# Patient Record
Sex: Female | Born: 1980 | Race: Black or African American | Hispanic: No | Marital: Single | Smoking: Former smoker
Health system: Southern US, Community
[De-identification: ages and names within clinical notes are randomized; demographics above are authoritative.]

## PROBLEM LIST (undated history)

## (undated) DIAGNOSIS — F419 Anxiety disorder, unspecified: Secondary | ICD-10-CM

## (undated) DIAGNOSIS — K861 Other chronic pancreatitis: Secondary | ICD-10-CM

## (undated) DIAGNOSIS — F1020 Alcohol dependence, uncomplicated: Secondary | ICD-10-CM

## (undated) HISTORY — PX: TUBAL LIGATION: SHX77

---
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
## 2002-01-25 ENCOUNTER — Emergency Department (HOSPITAL_COMMUNITY): Admission: EM | Admit: 2002-01-25 | Discharge: 2002-01-25 | Payer: Self-pay | Admitting: Emergency Medicine

## 2003-06-26 ENCOUNTER — Ambulatory Visit (HOSPITAL_COMMUNITY): Admission: AD | Admit: 2003-06-26 | Discharge: 2003-06-26 | Payer: Self-pay | Admitting: Obstetrics

## 2003-06-26 ENCOUNTER — Encounter (INDEPENDENT_AMBULATORY_CARE_PROVIDER_SITE_OTHER): Payer: Self-pay

## 2004-02-23 ENCOUNTER — Emergency Department (HOSPITAL_COMMUNITY): Admission: EM | Admit: 2004-02-23 | Discharge: 2004-02-23 | Payer: Self-pay

## 2004-02-24 ENCOUNTER — Emergency Department (HOSPITAL_COMMUNITY): Admission: EM | Admit: 2004-02-24 | Discharge: 2004-02-24 | Payer: Self-pay | Admitting: Emergency Medicine

## 2004-02-26 ENCOUNTER — Emergency Department (HOSPITAL_COMMUNITY): Admission: EM | Admit: 2004-02-26 | Discharge: 2004-02-26 | Payer: Self-pay | Admitting: Emergency Medicine

## 2004-12-23 ENCOUNTER — Emergency Department (HOSPITAL_COMMUNITY): Admission: EM | Admit: 2004-12-23 | Discharge: 2004-12-23 | Payer: Self-pay | Admitting: Emergency Medicine

## 2005-11-03 ENCOUNTER — Inpatient Hospital Stay (HOSPITAL_COMMUNITY): Admission: AD | Admit: 2005-11-03 | Discharge: 2005-11-03 | Payer: Self-pay | Admitting: Obstetrics

## 2006-05-15 ENCOUNTER — Inpatient Hospital Stay (HOSPITAL_COMMUNITY): Admission: AD | Admit: 2006-05-15 | Discharge: 2006-05-15 | Payer: Self-pay | Admitting: Obstetrics

## 2006-05-27 ENCOUNTER — Ambulatory Visit (HOSPITAL_COMMUNITY): Admission: RE | Admit: 2006-05-27 | Discharge: 2006-05-27 | Payer: Self-pay | Admitting: Obstetrics

## 2006-06-03 ENCOUNTER — Ambulatory Visit (HOSPITAL_COMMUNITY): Admission: RE | Admit: 2006-06-03 | Discharge: 2006-06-03 | Payer: Self-pay | Admitting: Obstetrics

## 2006-06-04 ENCOUNTER — Inpatient Hospital Stay (HOSPITAL_COMMUNITY): Admission: RE | Admit: 2006-06-04 | Discharge: 2006-06-06 | Payer: Self-pay | Admitting: Obstetrics

## 2006-07-21 ENCOUNTER — Observation Stay (HOSPITAL_COMMUNITY): Admission: AD | Admit: 2006-07-21 | Discharge: 2006-07-21 | Payer: Self-pay | Admitting: Obstetrics

## 2006-10-24 ENCOUNTER — Emergency Department (HOSPITAL_COMMUNITY): Admission: EM | Admit: 2006-10-24 | Discharge: 2006-10-24 | Payer: Self-pay | Admitting: Emergency Medicine

## 2006-11-20 ENCOUNTER — Inpatient Hospital Stay (HOSPITAL_COMMUNITY): Admission: AD | Admit: 2006-11-20 | Discharge: 2006-11-21 | Payer: Self-pay | Admitting: Obstetrics

## 2007-02-26 ENCOUNTER — Inpatient Hospital Stay (HOSPITAL_COMMUNITY): Admission: AD | Admit: 2007-02-26 | Discharge: 2007-02-26 | Payer: Self-pay | Admitting: Obstetrics

## 2007-04-13 ENCOUNTER — Inpatient Hospital Stay (HOSPITAL_COMMUNITY): Admission: AD | Admit: 2007-04-13 | Discharge: 2007-04-13 | Payer: Self-pay | Admitting: Obstetrics

## 2007-04-17 ENCOUNTER — Inpatient Hospital Stay (HOSPITAL_COMMUNITY): Admission: AD | Admit: 2007-04-17 | Discharge: 2007-04-17 | Payer: Self-pay | Admitting: Obstetrics & Gynecology

## 2007-05-17 ENCOUNTER — Inpatient Hospital Stay (HOSPITAL_COMMUNITY): Admission: AD | Admit: 2007-05-17 | Discharge: 2007-05-17 | Payer: Self-pay | Admitting: Obstetrics

## 2007-05-22 ENCOUNTER — Inpatient Hospital Stay (HOSPITAL_COMMUNITY): Admission: AD | Admit: 2007-05-22 | Discharge: 2007-05-24 | Payer: Self-pay | Admitting: Obstetrics

## 2007-05-23 ENCOUNTER — Encounter (INDEPENDENT_AMBULATORY_CARE_PROVIDER_SITE_OTHER): Payer: Self-pay | Admitting: Obstetrics

## 2010-12-05 NOTE — Op Note (Signed)
Kristina Moyer, Kristina NO.:  Moyer   MEDICAL RECORD NO.:  [id number]          PATIENT TYPE:  INP   LOCATION:  9107                          FACILITY:  [facility]   PHYSICIAN:  Kathreen Cosier, M.D.DATE OF BIRTH:  04-Mar-1981   DATE OF PROCEDURE:  05/23/2007  DATE OF DISCHARGE:                               OPERATIVE REPORT   PREOPERATIVE DIAGNOSIS:  Multiparity.   POSTOPERATIVE DIAGNOSIS:  Multiparity.   PROCEDURE:  Postpartum tubal ligation.   Under general anesthesia, the patient in the supine position, abdomen  prepped and draped, bladder emptied with a straight catheter.  A midline  subumbilical incision 1-inch long was made and carried to the fascia.  Fascia was cleaned, grasped with 2 Kochers on the fascia, and the fascia  and the peritoneum opened with the Mayo scissors.  The left tube was  grasped in the midportion with a Babcock clamp and the tube traced to  the fimbria.  Zero plain suture placed in the mesosalpinx below the  portion of tube within the clamp.  This was then tied and approximately  1 inch of tube transected.  Procedure done in a similar fashion on the  other side.  Lap and sponge counts correct.  Hemostasis satisfactory.  Abdomen closed in layers.  Peritoneum and fascia continuous suture of 0  Dexon.  Skin closed with subcuticular stitch of 4-0 Monocryl.  Blood  loss minimal.  The patient tolerated the procedure well, taken to  recovery room in good condition.           ______________________________  Kathreen Cosier, M.D.     BAM/MEDQ  D:  05/23/2007  T:  05/23/2007  Job:  [id number]

## 2010-12-08 NOTE — Op Note (Signed)
Kristina Moyer, Kristina Moyer NO.:  [id number]   MEDICAL RECORD NO.:  [id number]          PATIENT TYPE:  INP   LOCATION:  9122                          FACILITY:  [facility]   PHYSICIAN:  Kathreen Cosier, M.D.DATE OF BIRTH:  [birth year]   DATE OF PROCEDURE:  06/04/2006  DATE OF DISCHARGE:                                 OPERATIVE REPORT   HISTORY:  The patient is a 30 year old gravida 3, para 1, 0, 1, 1 with an  EDC of June 15, 2006.  She is known to have polyhydramnios and was  followed with nonstress test and ultrasound.  Amniocentesis done on June 03, 2006 showed the baby was mature.  She was brought in for induction.  Cervix is 3 cm.   The patient became fully dilated and push-to-plus three station, and  requested help.  A vacuum was applied at plus three station and through one  contraction delivery was affected.  No popoff.  The position was occiput  posterior.  There was a nuchal cord, which was cut prior to delivery.  The  placenta was spontaneous.  There was no episiotomy or laceration.           ______________________________  Kathreen Cosier, M.D.     BAM/MEDQ  D:  06/04/2006  T:  06/05/2006  Job:  [id number]

## 2010-12-08 NOTE — Op Note (Signed)
NAME:  Kristina Moyer, Kristina Moyer                          ACCOUNT NO.:  [id number]   MEDICAL RECORD NO.:  [id number]                   PATIENT TYPE:  MAT   LOCATION:  MATC                                 FACILITY:  [facility]   PHYSICIAN:  Kathreen Cosier, M.D.           DATE OF BIRTH:  1981-05-07   DATE OF PROCEDURE:  06/26/2003  DATE OF DISCHARGE:                                 OPERATIVE REPORT   PREOPERATIVE DIAGNOSIS:  Intrauterine fetal demise at nine weeks.   PROCEDURE:  Dilatation and evacuation.   Using MAC anesthesia and in the lithotomy position, the perineum and vagina  were prepped and draped, the bladder emptied with a straight catheter.  Bimanual exam revealed the uterus to be eight to 10 weeks' size.  A weighted  speculum placed in the vagina.  The cervix was injected with 10 mL of 1%  Xylocaine at 3, 9, and 12 o'clock.  The endometrial cavity sounded to 9 cm.  The cervical os was open and easily admitted a #9 suction tip.  The uterine  contents were aspirated until the cavity was clean.  The patient tolerated  the procedure well, was taken to the recovery room in good condition.                                               Kathreen Cosier, M.D.    BAM/MEDQ  D:  06/26/2003  T:  06/26/2003  Job:  [id number]

## 2011-05-02 LAB — CBC
HCT: 22.7 — ABNORMAL LOW
HCT: 25.4 — ABNORMAL LOW
Hemoglobin: 7.7 — CL
MCHC: 33.7
MCV: 86.7
RBC: 2.59 — ABNORMAL LOW
RBC: 2.93 — ABNORMAL LOW
WBC: 9.9

## 2011-05-02 LAB — RPR: RPR Ser Ql: NONREACTIVE

## 2011-05-03 LAB — CBC
HCT: 23.7 — ABNORMAL LOW
MCHC: 33.8
MCV: 86.8
Platelets: 274
RBC: 2.73 — ABNORMAL LOW

## 2011-05-03 LAB — DIFFERENTIAL
Eosinophils Absolute: 0
Lymphs Abs: 1.6
Monocytes Relative: 8
Neutro Abs: 5.9
Neutrophils Relative %: 72

## 2011-05-03 LAB — TYPE AND SCREEN

## 2011-05-03 LAB — URINALYSIS, ROUTINE W REFLEX MICROSCOPIC
Bilirubin Urine: NEGATIVE
Hgb urine dipstick: NEGATIVE
Ketones, ur: NEGATIVE
Nitrite: NEGATIVE
Urobilinogen, UA: 0.2

## 2011-05-03 LAB — WET PREP, GENITAL
Trich, Wet Prep: NONE SEEN
Yeast Wet Prep HPF POC: NONE SEEN

## 2011-05-03 LAB — RPR: RPR Ser Ql: NONREACTIVE

## 2011-05-03 LAB — HEPATITIS B SURFACE ANTIGEN: Hepatitis B Surface Ag: NEGATIVE

## 2011-05-03 LAB — HIV ANTIBODY (ROUTINE TESTING W REFLEX): HIV: NONREACTIVE

## 2011-05-07 LAB — CBC
HCT: 25.2 — ABNORMAL LOW
MCHC: 33.4
MCV: 84.4
RBC: 2.99 — ABNORMAL LOW
WBC: 8.2

## 2011-05-07 LAB — HIV ANTIBODY (ROUTINE TESTING W REFLEX): HIV: NONREACTIVE

## 2011-05-07 LAB — URINALYSIS, ROUTINE W REFLEX MICROSCOPIC
Bilirubin Urine: NEGATIVE
Glucose, UA: NEGATIVE
Hgb urine dipstick: NEGATIVE
Specific Gravity, Urine: 1.015
Urobilinogen, UA: 0.2

## 2011-05-07 LAB — DIFFERENTIAL
Basophils Relative: 0
Eosinophils Absolute: 0
Eosinophils Relative: 1
Lymphs Abs: 1.8
Monocytes Absolute: 0.6
Monocytes Relative: 7
Neutrophils Relative %: 70

## 2011-05-07 LAB — ABO/RH: ABO/RH(D): O POS

## 2011-05-07 LAB — RPR: RPR Ser Ql: NONREACTIVE

## 2011-05-07 LAB — TYPE AND SCREEN: ABO/RH(D): O POS

## 2012-07-18 ENCOUNTER — Emergency Department (HOSPITAL_COMMUNITY)
Admission: EM | Admit: 2012-07-18 | Discharge: 2012-07-18 | Disposition: A | Discharge status: Home / Self Care | Payer: Self-pay | Attending: Emergency Medicine | Admitting: Emergency Medicine

## 2012-07-18 ENCOUNTER — Encounter (HOSPITAL_COMMUNITY): Payer: Self-pay

## 2012-07-18 DIAGNOSIS — B86 Scabies: Secondary | ICD-10-CM | POA: Insufficient documentation

## 2012-07-18 MED ORDER — PERMETHRIN 5 % EX CREA: TOPICAL_CREAM | CUTANEOUS | Status: DC

## 2012-07-18 NOTE — ED Notes (Signed)
Patient presented to the Er with rah to hands, feet, arms, legs and back x more than a week.

## 2012-07-18 NOTE — ED Provider Notes (Signed)
History     CSN: [id number]  Arrival date & time 07/18/12  1027   First MD Initiated Contact with Patient 07/18/12 1047      Chief Complaint  Patient presents with  . Rash    (Consider location/radiation/quality/duration/timing/severity/associated sxs/prior treatment) HPI Comments: 75 y who presents for rash.  The rash started about 1 week ago. The rash started on the hands and wrist. The rash has spread to legs and abd.  The rash itches, no fevers.  The rash is not painful.  The rash is tiny discrete papules.  Multiple other family members with rash as well.    Patient is a 31 y.o. female presenting with rash. The history is provided by the patient. No language interpreter was used.  Rash  This is a new problem. The current episode started more than 1 week ago. The problem has not changed since onset.The problem is associated with an unknown factor. There has been no fever. The rash is present on the right hand, left hand, left arm, right arm and abdomen. The patient is experiencing no pain. Associated symptoms include itching. She has tried anti-itch cream for the symptoms. The treatment provided no relief.    History reviewed. No pertinent past medical history.  History reviewed. No pertinent past surgical history.  No family history on file.  History  Substance Use Topics  . Smoking status: Never Smoker   . Smokeless tobacco: Not on file  . Alcohol Use: No    OB History    Grav Para Term Preterm Abortions TAB SAB Ect Mult Living                  Review of Systems  Skin: Positive for itching and rash.  All other systems reviewed and are negative.    Allergies  Review of patient's allergies indicates no known allergies.  Home Medications   Current Outpatient Rx  Name  Route  Sig  Dispense  Refill  . PERMETHRIN 5 % EX CREA      Apply to affected area once repeat in one week   60 g   0     BP 104/87  Pulse 87  Temp 98.4 F (36.9 C) (Oral)  Wt 125 lb  11.2 oz (57.017 kg)  SpO2 100%  LMP 07/03/2012  Physical Exam  Nursing note and vitals reviewed. Constitutional: She is oriented to person, place, and time. She appears well-developed and well-nourished.  HENT:  Head: Normocephalic and atraumatic.  Right Ear: External ear normal.  Left Ear: External ear normal.  Mouth/Throat: Oropharynx is clear and moist.  Eyes: Conjunctivae normal and EOM are normal.  Neck: Normal range of motion. Neck supple.  Cardiovascular: Normal rate, normal heart sounds and intact distal pulses.   Pulmonary/Chest: Effort normal and breath sounds normal.  Abdominal: Soft. Bowel sounds are normal. There is no tenderness. There is no rebound.  Musculoskeletal: Normal range of motion.  Neurological: She is alert and oriented to person, place, and time.  Skin: Skin is warm.       Multiple scatter papules on hands and wrist and up arms, and on abdomen.  Consistent with scabies.    ED Course  Procedures (including critical care time)  Labs Reviewed - No data to display No results found.   1. Scabies       MDM  62 y with itchy rash x 1 week. Exam consistent with scabies.  Will start on meds.  Discussed signs that warrant reevaluation.  Chrystine Oiler, MD 07/18/12 [phone]

## 2012-09-17 ENCOUNTER — Inpatient Hospital Stay (HOSPITAL_COMMUNITY)
Admission: AD | Admit: 2012-09-17 | Discharge: 2012-09-17 | Disposition: A | Discharge status: Home / Self Care | Payer: Self-pay | Source: Ambulatory Visit | Attending: Obstetrics & Gynecology | Admitting: Obstetrics & Gynecology

## 2012-09-17 ENCOUNTER — Encounter (HOSPITAL_COMMUNITY): Payer: Self-pay

## 2012-09-17 DIAGNOSIS — R197 Diarrhea, unspecified: Secondary | ICD-10-CM | POA: Insufficient documentation

## 2012-09-17 DIAGNOSIS — R109 Unspecified abdominal pain: Secondary | ICD-10-CM | POA: Insufficient documentation

## 2012-09-17 DIAGNOSIS — K5289 Other specified noninfective gastroenteritis and colitis: Secondary | ICD-10-CM

## 2012-09-17 DIAGNOSIS — D509 Iron deficiency anemia, unspecified: Secondary | ICD-10-CM | POA: Insufficient documentation

## 2012-09-17 DIAGNOSIS — A088 Other specified intestinal infections: Secondary | ICD-10-CM | POA: Insufficient documentation

## 2012-09-17 DIAGNOSIS — K529 Noninfective gastroenteritis and colitis, unspecified: Secondary | ICD-10-CM

## 2012-09-17 DIAGNOSIS — R112 Nausea with vomiting, unspecified: Secondary | ICD-10-CM | POA: Insufficient documentation

## 2012-09-17 LAB — CBC
HCT: 26.2 % — ABNORMAL LOW (ref 36.0–46.0)
Hemoglobin: 8 g/dL — ABNORMAL LOW (ref 12.0–15.0)
MCHC: 30.5 g/dL (ref 30.0–36.0)
WBC: 8.1 10*3/uL (ref 4.0–10.5)

## 2012-09-17 LAB — URINE MICROSCOPIC-ADD ON

## 2012-09-17 LAB — BASIC METABOLIC PANEL
BUN: 7 mg/dL (ref 6–23)
Chloride: 101 mEq/L (ref 96–112)
GFR calc Af Amer: >90 mL/min (ref >90)
GFR calc non Af Amer: >90 mL/min (ref >90)
Glucose, Bld: 155 mg/dL — ABNORMAL HIGH (ref 70–99)
Potassium: 3.6 mEq/L (ref 3.5–5.1)
Sodium: 135 mEq/L (ref 135–145)

## 2012-09-17 LAB — URINALYSIS, ROUTINE W REFLEX MICROSCOPIC
Bilirubin Urine: NEGATIVE
Glucose, UA: NEGATIVE mg/dL
Ketones, ur: 15 mg/dL — AB
Protein, ur: NEGATIVE mg/dL
Urobilinogen, UA: 0.2 mg/dL (ref 0.0–1.0)

## 2012-09-17 LAB — POCT PREGNANCY, URINE: Preg Test, Ur: NEGATIVE

## 2012-09-17 MED ORDER — PROMETHAZINE HCL 25 MG PO TABS: 25.0000 mg | ORAL_TABLET | Freq: Four times a day (QID) | ORAL | Status: DC | PRN

## 2012-09-17 MED ORDER — FERROUS SULFATE 325 (65 FE) MG PO TABS: 325.0000 mg | ORAL_TABLET | Freq: Two times a day (BID) | ORAL | Status: DC

## 2012-09-17 MED ORDER — KETOROLAC TROMETHAMINE 60 MG/2ML IM SOLN
60.0000 mg | Freq: Once | INTRAMUSCULAR | Status: AC
  Administered 2012-09-17: 60 mg via INTRAMUSCULAR
  Filled 2012-09-17: qty 2

## 2012-09-17 MED ORDER — LACTATED RINGERS IV BOLUS (SEPSIS)
1000.0000 mL | Freq: Once | INTRAVENOUS | Status: AC
  Administered 2012-09-17: 1000 mL via INTRAVENOUS

## 2012-09-17 MED ORDER — PROMETHAZINE HCL 25 MG/ML IJ SOLN
25.0000 mg | Freq: Once | INTRAVENOUS | Status: AC
  Administered 2012-09-17: 25 mg via INTRAVENOUS
  Filled 2012-09-17: qty 1

## 2012-09-17 NOTE — MAU Note (Signed)
Pt had one instance of diarrhea in MAU

## 2012-09-17 NOTE — MAU Note (Signed)
Patient states she started having abdominal pain yesterday with vomiting. Denies bleeding or discharge. Has had her tubes tied.

## 2012-09-17 NOTE — MAU Provider Note (Signed)
History     CSN: [id number]  Arrival date and time: 09/17/12 4098   First Provider Initiated Contact with Patient 09/17/12 0830      Chief Complaint  Patient presents with  . Abdominal Pain   HPI Ms. Kristina Moyer is a 32 y.o. [phone] who presents to MAU today with complaint of N/V/D and abdominal pain since yesterday. N/V started yesterday and the first episode of diarrhea occurred here in MAU. The patient is in significant pain and states that it is "terrible."   After pain medication and IV administration of LR with phenergan, attempted to collect HPI again.   Patient states that she started having some mild abdominal discomfort yesterday during the day. Last night she started having N/V. She had not had diarrhea until arrival in MAU. She denies any sick contacts, or abnormal food consumption. She has not had fever. Her discomfort is mainly epigastric, but recently she has had some lower abdominal cramping. LMP 09/11/12.   OB History   Grav Para Term Preterm Abortions TAB SAB Ect Mult Living   4 3 3  1  1   3       Past Medical History  Diagnosis Date  . Medical history non-contributory   . Anemia     Past Surgical History  Procedure Laterality Date  . Tubal ligation      History reviewed. No pertinent family history.  History  Substance Use Topics  . Smoking status: Never Smoker   . Smokeless tobacco: Never Used  . Alcohol Use: No    Allergies: No Known Allergies  No prescriptions prior to admission    Review of Systems  Constitutional: Negative for fever.  Gastrointestinal: Positive for nausea, vomiting, abdominal pain and diarrhea. Negative for constipation and blood in stool.  Genitourinary: Negative for dysuria, urgency and frequency.  Musculoskeletal: Negative for back pain.  Neurological: Negative for dizziness and headaches.   Physical Exam   Blood pressure 97/52, pulse 71, temperature 97.6 F (36.4 C), temperature source Oral, resp. rate 18,  height 5\' 8"  (1.727 m), weight 120 lb 6.4 oz (54.613 kg), last menstrual period 09/11/2012, SpO2 100.00%.  Physical Exam  Constitutional: She is oriented to person, place, and time. She appears well-developed and well-nourished. She appears ill. No distress.  HENT:  Head: Normocephalic and atraumatic.  Cardiovascular: Normal rate, regular rhythm and normal heart sounds.   Respiratory: Effort normal and breath sounds normal. No respiratory distress.  GI: Soft. Bowel sounds are normal. She exhibits no distension and no mass. There is tenderness (mild diffuse tenderness to palpation). There is no rebound and no guarding.  Neurological: She is alert and oriented to person, place, and time.  Skin: Skin is warm and dry. No erythema.  Psychiatric: She has a normal mood and affect.   Results for orders placed during the hospital encounter of 09/17/12 (from the past 24 hour(s))  URINALYSIS, ROUTINE W REFLEX MICROSCOPIC     Status: Abnormal   Collection Time    09/17/12  8:00 AM      Result Value Range   Color, Urine YELLOW  YELLOW   APPearance HAZY (*) CLEAR   Specific Gravity, Urine >1.030 (*) 1.005 - 1.030   pH 6.0  5.0 - 8.0   Glucose, UA NEGATIVE  NEGATIVE mg/dL   Hgb urine dipstick LARGE (*) NEGATIVE   Bilirubin Urine NEGATIVE  NEGATIVE   Ketones, ur 15 (*) NEGATIVE mg/dL   Protein, ur NEGATIVE  NEGATIVE mg/dL  Urobilinogen, UA 0.2  0.0 - 1.0 mg/dL   Nitrite NEGATIVE  NEGATIVE   Leukocytes, UA TRACE (*) NEGATIVE  URINE MICROSCOPIC-ADD ON     Status: Abnormal   Collection Time    09/17/12  8:00 AM      Result Value Range   Squamous Epithelial / LPF FEW (*) RARE   WBC, UA 3-6  <3 WBC/hpf   RBC / HPF 11-20  <3 RBC/hpf   Bacteria, UA RARE  RARE  POCT PREGNANCY, URINE     Status: None   Collection Time    09/17/12  8:10 AM      Result Value Range   Preg Test, Ur NEGATIVE  NEGATIVE  CBC     Status: Abnormal   Collection Time    09/17/12  8:34 AM      Result Value Range   WBC 8.1   4.0 - 10.5 K/uL   RBC 3.55 (*) 3.87 - 5.11 MIL/uL   Hemoglobin 8.0 (*) 12.0 - 15.0 g/dL   HCT 16.1 (*) 09.6 - 04.5 %   MCV 73.8 (*) 78.0 - 100.0 fL   MCH 22.5 (*) 26.0 - 34.0 pg   MCHC 30.5  30.0 - 36.0 g/dL   RDW 40.9 (*) 81.1 - 91.4 %   Platelets 316  150 - 400 K/uL  BASIC METABOLIC PANEL     Status: Abnormal   Collection Time    09/17/12  8:34 AM      Result Value Range   Sodium 135  135 - 145 mEq/L   Potassium 3.6  3.5 - 5.1 mEq/L   Chloride 101  96 - 112 mEq/L   CO2 20  19 - 32 mEq/L   Glucose, Bld 155 (*) 70 - 99 mg/dL   BUN 7  6 - 23 mg/dL   Creatinine, Ser 7.82  0.50 - 1.10 mg/dL   Calcium 9.1  8.4 - 95.6 mg/dL   GFR calc non Af Amer >90  >90 mL/min   GFR calc Af Amer >90  >90 mL/min    MAU Course  Procedures None  MDM IM toradol, 2 L LR IV given; first liter with 25 mg phenergan infusion Patient reports improvement in symptoms.  Afebrile, WBC - WNL Anemia is chronic, patient is not symptomatic.   Assessment and Plan  A: Iron deficiency anemia Acute viral gastroenteritis  P: Discharge home Rx for phenergan and iron sent to patient's pharmacy Encouraged follow-up with PCP Patient may return if symptoms worsen, but advised since she is not pregnant that it may be best to go to urgent care or [facility] for further evaluation if symptoms do not improve or worsen   Freddi Starr, PA-C  09/17/2012, 1:37 PM

## 2012-09-17 NOTE — MAU Note (Signed)
Pt states pain/vomiting began yesterday,pain is upper and lower abdomen. Vomiting in room. Also trying to induce vomiting to feel better. Denies diarrhea. Denies hx ovarian cysts.

## 2012-09-18 NOTE — MAU Provider Note (Signed)
Attestation of Attending Supervision of Advanced Practitioner (PA/CNM/NP): Evaluation and management procedures were performed by the Advanced Practitioner under my supervision and collaboration.  I have reviewed the Advanced Practitioner's note and chart, and I agree with the management and plan.  Nida Manfredi, MD, FACOG Attending Obstetrician & Gynecologist Faculty Practice, [facility]  

## 2012-11-17 ENCOUNTER — Emergency Department (HOSPITAL_COMMUNITY): Payer: Self-pay

## 2012-11-17 ENCOUNTER — Inpatient Hospital Stay (HOSPITAL_COMMUNITY)
Admission: EM | Admit: 2012-11-17 | Discharge: 2012-11-20 | DRG: 439 | Disposition: A | Discharge status: Home / Self Care | Payer: Self-pay | Attending: Internal Medicine | Admitting: Internal Medicine

## 2012-11-17 ENCOUNTER — Encounter (HOSPITAL_COMMUNITY): Payer: Self-pay | Admitting: Emergency Medicine

## 2012-11-17 DIAGNOSIS — E871 Hypo-osmolality and hyponatremia: Secondary | ICD-10-CM

## 2012-11-17 DIAGNOSIS — E872 Acidosis, unspecified: Secondary | ICD-10-CM | POA: Diagnosis present

## 2012-11-17 DIAGNOSIS — F1010 Alcohol abuse, uncomplicated: Secondary | ICD-10-CM | POA: Diagnosis present

## 2012-11-17 DIAGNOSIS — K859 Acute pancreatitis without necrosis or infection, unspecified: Principal | ICD-10-CM | POA: Diagnosis present

## 2012-11-17 DIAGNOSIS — F17200 Nicotine dependence, unspecified, uncomplicated: Secondary | ICD-10-CM | POA: Diagnosis present

## 2012-11-17 DIAGNOSIS — D509 Iron deficiency anemia, unspecified: Secondary | ICD-10-CM | POA: Diagnosis present

## 2012-11-17 LAB — CBC WITH DIFFERENTIAL/PLATELET
Hemoglobin: 9.2 g/dL — ABNORMAL LOW (ref 12.0–15.0)
Lymphocytes Relative: 20 % (ref 12–46)
Lymphs Abs: 1.4 10*3/uL (ref 0.7–4.0)
Monocytes Relative: 6 % (ref 3–12)
Neutrophils Relative %: 74 % (ref 43–77)
Platelets: 461 10*3/uL — ABNORMAL HIGH (ref 150–400)
RBC: 3.95 MIL/uL (ref 3.87–5.11)
WBC: 6.9 10*3/uL (ref 4.0–10.5)

## 2012-11-17 LAB — COMPREHENSIVE METABOLIC PANEL
ALT: 8 U/L (ref 0–35)
AST: 29 U/L (ref 0–37)
Alkaline Phosphatase: 60 U/L (ref 39–117)
CO2: 20 mEq/L (ref 19–32)
Chloride: 99 mEq/L (ref 96–112)
GFR calc non Af Amer: >90 mL/min (ref >90)
Glucose, Bld: 106 mg/dL — ABNORMAL HIGH (ref 70–99)
Potassium: 3.9 mEq/L (ref 3.5–5.1)
Sodium: 134 mEq/L — ABNORMAL LOW (ref 135–145)
Total Bilirubin: 0.4 mg/dL (ref 0.3–1.2)

## 2012-11-17 LAB — URINE MICROSCOPIC-ADD ON

## 2012-11-17 LAB — BASIC METABOLIC PANEL
BUN: 5 mg/dL — ABNORMAL LOW (ref 6–23)
CO2: 21 mEq/L (ref 19–32)
Chloride: 105 mEq/L (ref 96–112)
GFR calc non Af Amer: >90 mL/min (ref >90)
Glucose, Bld: 76 mg/dL (ref 70–99)
Potassium: 4.3 mEq/L (ref 3.5–5.1)
Sodium: 136 mEq/L (ref 135–145)

## 2012-11-17 LAB — URINALYSIS, ROUTINE W REFLEX MICROSCOPIC
Bilirubin Urine: NEGATIVE
Hgb urine dipstick: NEGATIVE
Nitrite: NEGATIVE
Specific Gravity, Urine: 1.025 (ref 1.005–1.030)
Urobilinogen, UA: 1 mg/dL (ref 0.0–1.0)
pH: 7 (ref 5.0–8.0)

## 2012-11-17 LAB — IRON AND TIBC
Saturation Ratios: 7 % — ABNORMAL LOW (ref 20–55)
TIBC: 420 ug/dL (ref 250–470)
UIBC: 391 ug/dL (ref 125–400)

## 2012-11-17 LAB — LIPASE, BLOOD: Lipase: 194 U/L — ABNORMAL HIGH (ref 11–59)

## 2012-11-17 LAB — VITAMIN B12: Vitamin B-12: 498 pg/mL (ref 211–911)

## 2012-11-17 LAB — FOLATE: Folate: >20 ng/mL

## 2012-11-17 LAB — RETICULOCYTES: RBC.: 3.52 MIL/uL — ABNORMAL LOW (ref 3.87–5.11)

## 2012-11-17 LAB — POCT PREGNANCY, URINE: Preg Test, Ur: NEGATIVE

## 2012-11-17 MED ORDER — ONDANSETRON HCL 4 MG/2ML IJ SOLN
4.0000 mg | Freq: Once | INTRAMUSCULAR | Status: AC
  Administered 2012-11-17: 4 mg via INTRAVENOUS
  Filled 2012-11-17: qty 2

## 2012-11-17 MED ORDER — SODIUM CHLORIDE 0.9 % IV BOLUS (SEPSIS)
2000.0000 mL | Freq: Once | INTRAVENOUS | Status: AC
  Administered 2012-11-17: 2000 mL via INTRAVENOUS

## 2012-11-17 MED ORDER — LORAZEPAM 1 MG PO TABS
1.0000 mg | ORAL_TABLET | Freq: Four times a day (QID) | ORAL | Status: AC | PRN
  Administered 2012-11-17: 1 mg via ORAL
  Filled 2012-11-17: qty 1

## 2012-11-17 MED ORDER — FOLIC ACID 1 MG PO TABS
1.0000 mg | ORAL_TABLET | Freq: Every day | ORAL | Status: DC
  Administered 2012-11-17 – 2012-11-20 (×4): 1 mg via ORAL
  Filled 2012-11-17 (×5): qty 1

## 2012-11-17 MED ORDER — ONDANSETRON HCL 4 MG/2ML IJ SOLN
4.0000 mg | Freq: Four times a day (QID) | INTRAMUSCULAR | Status: DC | PRN
  Administered 2012-11-18 – 2012-11-19 (×2): 4 mg via INTRAVENOUS
  Filled 2012-11-17 (×3): qty 2

## 2012-11-17 MED ORDER — MORPHINE SULFATE 4 MG/ML IJ SOLN
4.0000 mg | Freq: Once | INTRAMUSCULAR | Status: AC
  Administered 2012-11-17: 4 mg via INTRAVENOUS
  Filled 2012-11-17: qty 1

## 2012-11-17 MED ORDER — MORPHINE SULFATE 2 MG/ML IJ SOLN
2.0000 mg | INTRAMUSCULAR | Status: DC | PRN
  Administered 2012-11-17 – 2012-11-20 (×10): 2 mg via INTRAVENOUS
  Filled 2012-11-17 (×11): qty 1

## 2012-11-17 MED ORDER — ENOXAPARIN SODIUM 40 MG/0.4ML ~~LOC~~ SOLN
40.0000 mg | SUBCUTANEOUS | Status: DC
  Administered 2012-11-17 – 2012-11-19 (×3): 40 mg via SUBCUTANEOUS
  Filled 2012-11-17 (×5): qty 0.4

## 2012-11-17 MED ORDER — THIAMINE HCL 100 MG/ML IJ SOLN
100.0000 mg | Freq: Every day | INTRAMUSCULAR | Status: DC
  Filled 2012-11-17 (×3): qty 1

## 2012-11-17 MED ORDER — VITAMIN B-1 100 MG PO TABS
100.0000 mg | ORAL_TABLET | Freq: Every day | ORAL | Status: DC
  Administered 2012-11-18 – 2012-11-20 (×3): 100 mg via ORAL
  Filled 2012-11-17 (×4): qty 1

## 2012-11-17 MED ORDER — LORAZEPAM 2 MG/ML IJ SOLN: 1.0000 mg | Freq: Four times a day (QID) | INTRAMUSCULAR | Status: AC | PRN

## 2012-11-17 MED ORDER — POTASSIUM CHLORIDE 2 MEQ/ML IV SOLN
INTRAVENOUS | Status: AC
  Administered 2012-11-17: 17:00:00 via INTRAVENOUS
  Filled 2012-11-17 (×4): qty 1000

## 2012-11-17 MED ORDER — POTASSIUM CHLORIDE 2 MEQ/ML IV SOLN
INTRAVENOUS | Status: DC
  Administered 2012-11-18 – 2012-11-20 (×6): via INTRAVENOUS
  Filled 2012-11-17 (×9): qty 1000

## 2012-11-17 NOTE — ED Notes (Signed)
REPORT CALLED TO AMANDA ON 6 NORTH

## 2012-11-17 NOTE — ED Notes (Signed)
Patient transported to Ultrasound 

## 2012-11-17 NOTE — ED Notes (Signed)
PATIENT STATES HAVING ABDOMINAL PAIN AND BACK PAIN FOR THE PAST COUPLE DAYS. STATES SHE HAS BEEN NAUSEATED. PT ADMITS TO DAILY ALCOHOL CONSUMPTION. STATES NO HISTORY OF PANCREATITIS. STATES SHE DRINKS USUALLY A COUPLE SHOTS OF LIQUOR AND 2-3 40 OUNCE BEERS A DAY. STATES "I CAN QUIT DRINKING ANYTIME WITH NO PROBLEM". DENIES HISTORY OF DT'S.

## 2012-11-17 NOTE — ED Provider Notes (Signed)
History     CSN: [id number]  Arrival date & time 11/17/12  0846   First MD Initiated Contact with Patient 11/17/12 [phone]      Chief Complaint  Patient presents with  . Abdominal Pain  . Emesis    (Consider location/radiation/quality/duration/timing/severity/associated sxs/prior treatment) HPI  Kristina Moyer is a 32 y.o. female complaining of diffuse abdominal pain worsening over the course of the last 2 days. Patient also has multiple episodes of nonbloody, possibly bilious emesis starting last night. Patient rates her pain at 10 out of 10. She denies constipation, diarrhea, fever, dysuria, frequency, abnormal vaginal discharge, cough, chest pain, shortness of breath.   Patient drinks alcohol daily, she drank excessively over the course of the last weekend. She has never had any withdrawal symptoms such as seizure or shakiness. She states that she has been drinking daily only over the course of the last year.  Last menstrual period was April 15  Past Medical History  Diagnosis Date  . Medical history non-contributory   . Anemia     Past Surgical History  Procedure Laterality Date  . Tubal ligation      History reviewed. No pertinent family history.  History  Substance Use Topics  . Smoking status: Never Smoker   . Smokeless tobacco: Never Used  . Alcohol Use: No    OB History   Grav Para Term Preterm Abortions TAB SAB Ect Mult Living   4 3 3  1  1   3       Review of Systems  Constitutional: Negative for fever.  Respiratory: Negative for shortness of breath.   Cardiovascular: Negative for chest pain.  Gastrointestinal: Positive for nausea, vomiting and abdominal pain. Negative for diarrhea.  All other systems reviewed and are negative.    Allergies  Review of patient's allergies indicates no known allergies.  Home Medications   Current Outpatient Rx  Name  Route  Sig  Dispense  Refill  . ibuprofen (ADVIL,MOTRIN) 200 MG tablet   Oral   Take 400 mg by  mouth every 6 (six) hours as needed for pain.           BP 136/86  Pulse 76  Temp(Src) 97.6 F (36.4 C) (Oral)  Resp 14  SpO2 100%  LMP 11/03/2012  Physical Exam  Nursing note and vitals reviewed. Constitutional: She is oriented to person, place, and time. She appears well-developed and well-nourished. No distress.  Writing, appears acutely uncomfortable.  HENT:  Head: Normocephalic.  Eyes: Conjunctivae and EOM are normal.  Cardiovascular: Normal rate, regular rhythm, normal heart sounds and intact distal pulses.   Pulmonary/Chest: Effort normal and breath sounds normal. No stridor. No respiratory distress. She has no wheezes. She has no rales. She exhibits no tenderness.  Abdominal: Soft. Bowel sounds are normal. She exhibits no distension and no mass. There is tenderness. There is no rebound and no guarding.  CC tender to palpation with no guarding or rebound, normal bowel sounds. There is no tenderness over McBurney's point, Rovsing, psoas and obturator signs are negative.  Musculoskeletal: Normal range of motion.  Neurological: She is alert and oriented to person, place, and time.  Psychiatric: She has a normal mood and affect.    ED Course  Procedures (including critical care time)  Labs Reviewed  URINALYSIS, ROUTINE W REFLEX MICROSCOPIC - Abnormal; Notable for the following:    APPearance CLOUDY (*)    Ketones, ur >80 (*)    Leukocytes, UA MODERATE (*)  All other components within normal limits  CBC WITH DIFFERENTIAL - Abnormal; Notable for the following:    Hemoglobin 9.2 (*)    HCT 28.0 (*)    MCV 70.9 (*)    MCH 23.3 (*)    RDW 19.2 (*)    Platelets 461 (*)    All other components within normal limits  LIPASE, BLOOD - Abnormal; Notable for the following:    Lipase 194 (*)    All other components within normal limits  COMPREHENSIVE METABOLIC PANEL - Abnormal; Notable for the following:    Sodium 134 (*)    Glucose, Bld 106 (*)    BUN 5 (*)    All other  components within normal limits  URINE MICROSCOPIC-ADD ON - Abnormal; Notable for the following:    Squamous Epithelial / LPF MANY (*)    Bacteria, UA FEW (*)    All other components within normal limits  URINE CULTURE  POCT PREGNANCY, URINE   US Abdomen Complete  11/17/2012  *RADIOLOGY REPORT*  Clinical Data:  Abdominal pain, nausea, vomiting.  COMPLETE ABDOMINAL ULTRASOUND  Comparison:  CT 02/24/2004.  Findings:  Gallbladder:  No gallstones, gallbladder wall thickening, or pericholecystic fluid.  Common bile duct:   Normal caliber, 2 mm.  Liver:  No focal lesion identified.  Within normal limits in parenchymal echogenicity.  IVC:  Appears normal.  Pancreas:  No focal abnormality seen.  Spleen:  Within normal limits in size and echotexture.  Right Kidney:   Normal in size and parenchymal echogenicity.  No evidence of mass or hydronephrosis.  Left Kidney:  Normal in size and parenchymal echogenicity.  No evidence of mass or hydronephrosis.  Abdominal aorta:  No aneurysm identified.  IMPRESSION: Negative abdominal ultrasound.   Original Report Authenticated By: Charlett Nose, M.D.      1. Acute pancreatitis       MDM   Kristina Moyer is a 32 y.o. female with 2 days of nausea vomiting and abdominal pain. Abdominal exam is nonsurgical with no guarding or rebound patient is diffusely tender to palpation especially in the epigastrium. Plan is IV fluids, make n.p.o., basic blood work and abdominal ultrasound  Blood work is significant for lipase of 194. No elevation in liver function tests or creatinine. Patient has no leukocytosis. She does not appear to be hemoconcentrated she has a chronic anemia.  Further discussion with the patient shows that she is in daily drinker. This is likely the cause of her pancreatitis. Abdominal ultrasound shows no abnormalities in the biliary tree.  She will be admitted to internal medicine teaching service under the care of Dr. Manson Passey.   Filed Vitals:   11/17/12  0900  BP: 136/86  Pulse: 76  Temp: 97.6 F (36.4 C)  TempSrc: Oral  Resp: 14  SpO2: 100%          Wynetta Emery, PA-C 11/17/12 1212

## 2012-11-17 NOTE — ED Notes (Signed)
Pt from home c/o abd pain and n/v x 2 days.

## 2012-11-17 NOTE — H&P (Signed)
Hospital Admission Note Date: 11/17/2012  Patient name: Kristina Moyer Medical record number: [id number] Date of birth: Dec 12, 1980 Age: 32 y.o. Gender: female PCP: No primary provider on file.   Service:  Internal Medicine Teaching Service  Attending Physician:  Dr. Kem Kays   First Contact:  Dr. Earlene Plater   Pager:  [phone] Second Contact:  Dr. Manson Passey  Pager: [phone]      After 5PM, weekends, and holidays: First Contact:              Pager: [phone] Second Contact:         Pager: [phone]    Chief Complaint:  Abdominal pain and nausea    History of Present Illness:  32 year old woman with no past medical history, presenting with 6 days of abdominal pain and one day of nausea and vomiting. Onset was 6 days ago. Patient admits to heavy alcohol abuse may be to blame. Quality described as cramping. Location is periumbilical and epigastric without radiation. Severity is now rated at 10/10. Since onset, pain has been gradually intensifying; but it has been constant since onset. Patient has tried ibuprofen without relief. No aggravating or alleviating factors. No other symptoms associated with this. Review of systems negative. Patient reports drinking 3 beers every day with binge drinking on weekends.     Review of Systems:   Constitutional: Negative for fever and chills.  HENT: Negative for hearing loss, ear pain, congestion and sore throat.   Eyes: Negative for blurred vision.  Respiratory: Negative for cough, sputum production and shortness of breath.   Cardiovascular: Negative for chest pain and leg swelling.  Gastrointestinal: Positive for nausea, vomiting and abdominal pain. Negative for diarrhea, constipation and blood in stool.  Genitourinary: Negative for dysuria and hematuria.  Musculoskeletal: Negative for myalgias.  Skin: Negative for rash.  Neurological: Negative for dizziness and headaches.     Medical History: History reviewed. No pertinent past medical history.  Surgical  History: Past Surgical History  Procedure Laterality Date  . Tubal ligation      Home Medications: Current Outpatient Rx  Name  Route  Sig  Dispense  Refill  . ibuprofen (ADVIL,MOTRIN) 200 MG tablet   Oral   Take 400 mg by mouth every 6 (six) hours as needed for pain.           Allergies: Allergies as of 11/17/2012  . (No Known Allergies)    Family History: History reviewed. No pertinent family history.  Social History: History   Social History  . Marital Status: Single    Spouse Name: N/A    Number of Children: 3  . Years of Education: N/A   Social History Main Topics  . Smoking status: Current Every Day Smoker -- 0.50 packs/day    Types: Cigarettes  . Smokeless tobacco: Not on file  . Alcohol Use: 10.5 oz/week    21 drink(s) per week     Comment: 3 beers per day, binge drinking on weekends  . Drug Use: No  . Sexually Active: Yes    Birth Control/ Protection: Surgical, Condom    Physical exam:  VITALS: BP 136/86, HR 76, RR 14, temp 97.24F, SpO2 100% on room air GENERAL: well developed, well nourished; no acute distress HEAD: atraumatic, normocephalic EYES: pupils equal, round and reactive; sclera anicteric; normal conjunctiva EARS: canals patent and TMs normal bilaterally NOSE/THROAT: oropharynx clear, moist mucous membranes, pink gums, normal dentition NECK: supple, thyroid normal in size and without palpable nodules LYMPH: no cervical or supraclavicular lymphadenopathy  LUNGS: clear to auscultation bilaterally, normal work of breathing HEART: normal rate and regular rhythm; normal S1 and S2 without S3 or S4; no murmurs, rubs, or clicks PULSES: radial and dorsalis pedis pulses are 2+ and symmetric ABDOMEN: soft, diffuse generalized tenderness with a patient of all quadrants, normal bowel sounds, no masses or organomegaly, no rebound tenderness SKIN: warm, dry, intact, normal turgor, no rashes EXTREMITIES: no peripheral edema, clubbing, or cyanosis       Lab results: CMP  Component Value Date/Time   NA 134* 11/17/2012 0918   K 3.9 11/17/2012 0918   CL 99 11/17/2012 0918   CO2 20 11/17/2012 0918   GLUCOSE 106* 11/17/2012 0918   BUN 5* 11/17/2012 0918   CREATININE 0.63 11/17/2012 0918   CALCIUM 9.8 11/17/2012 0918   PROT 7.8 11/17/2012 0918   ALBUMIN 4.2 11/17/2012 0918   AST 29 11/17/2012 0918   ALT 8 11/17/2012 0918   ALKPHOS 60 11/17/2012 0918   BILITOT 0.4 11/17/2012 0918   GFRNONAA >90 11/17/2012 0918   GFRAA >90 11/17/2012 0918    Lipase   11/17/12 0918  LIPASE 194*    CBC Component Value Date/Time   WBC 6.9 11/17/2012 0918   RBC 3.95 11/17/2012 0918   HGB 9.2* 11/17/2012 0918   HCT 28.0* 11/17/2012 0918   PLT 461* 11/17/2012 0918   MCV 70.9* 11/17/2012 0918   MCH 23.3* 11/17/2012 0918   MCHC 32.9 11/17/2012 0918   RDW 19.2* 11/17/2012 0918   LYMPHSABS 1.4 11/17/2012 0918   MONOABS 0.4 11/17/2012 0918   EOSABS 0.0 11/17/2012 0918   BASOSABS 0.0 11/17/2012 0918     Urinalysis:  11/17/12 1116  COLORURINE YELLOW  LABSPEC 1.025  PHURINE 7.0  GLUCOSEU NEGATIVE  HGBUR NEGATIVE  BILIRUBINUR NEGATIVE  KETONESUR >80*  PROTEINUR NEGATIVE  UROBILINOGEN 1.0  NITRITE NEGATIVE  LEUKOCYTESUR MODERATE*  WBC 7-10  RBC 0-2  SQUAM CELLS MANY*  BACTERIA FEW*    Misc. Labs: UPC NEGATIVE   Imaging results: US Abdomen Complete 11/17/2012  FINDINGS:  Gallbladder:  No gallstones, gallbladder wall thickening, or pericholecystic fluid.  Common bile duct:   Normal caliber, 2 mm.  Liver:  No focal lesion identified.  Within normal limits in parenchymal echogenicity.  IVC:  Appears normal.  Pancreas:  No focal abnormality seen.  Spleen:  Within normal limits in size and echotexture.  Right Kidney:   Normal in size and parenchymal echogenicity.  No evidence of mass or hydronephrosis.  Left Kidney:  Normal in size and parenchymal echogenicity.  No evidence of mass or hydronephrosis.  Abdominal aorta:  No aneurysm identified.  IMPRESSION: Negative  abdominal ultrasound.    Assessment and Plan:  1.   Acute pancreatitis:  Lipase elevated at 194. Likely provoked by alcohol binge along with chronic alcohol abuse. Ultrasound of abdomen negative for stones. Calcium normal. Bowel rest initiated. IV analgesia and maintenance fluids.   2.   Alcohol abuse:  Patient drinks 3 beers a day along with binge drinking on the weekends. Social work consult for this. I provided the initial education on the length between alcohol abuse and pancreatitis.  I advised her to stop drinking altogether. CIWA protocol.  3.   Anion gap metabolic acidosis, alcoholic ketoacidosis:  Anion gap 15. Bicarbonate 20. Greater than 80 ketones in urine. Glucose is 106. We will provide intravenous hydration.   4.   Microcytic anemia:  Hemoglobin 9.2 with an MCV of 70.9. The report of abnormal bleeding. Menstruation lasts 5  days with a normal flow. We will check iron studies.  5.   Prophylaxis:  Enoxaparin 40 mg [place] daily  6.   Disposition:  No PCP.  Expected discharge date > 2 days.    Signed by:  Dorthula Rue. Earlene Plater, MD PGY-I, Internal Medicine  11/17/2012, 12:40 PM

## 2012-11-17 NOTE — ED Notes (Signed)
Pt stated that she does not feel as if she needs to use the bathroom

## 2012-11-17 NOTE — ED Notes (Signed)
Patient is resting comfortably. 

## 2012-11-18 DIAGNOSIS — K859 Acute pancreatitis without necrosis or infection, unspecified: Principal | ICD-10-CM

## 2012-11-18 LAB — BASIC METABOLIC PANEL
Calcium: 8.5 mg/dL (ref 8.4–10.5)
Creatinine, Ser: 0.64 mg/dL (ref 0.50–1.10)
GFR calc non Af Amer: >90 mL/min (ref >90)
Glucose, Bld: 127 mg/dL — ABNORMAL HIGH (ref 70–99)
Sodium: 135 mEq/L (ref 135–145)

## 2012-11-18 LAB — URINE CULTURE

## 2012-11-18 LAB — CBC
MCH: 23 pg — ABNORMAL LOW (ref 26.0–34.0)
Platelets: 361 10*3/uL (ref 150–400)
RBC: 3.13 MIL/uL — ABNORMAL LOW (ref 3.87–5.11)
RDW: 19.2 % — ABNORMAL HIGH (ref 11.5–15.5)

## 2012-11-18 LAB — HIV ANTIBODY (ROUTINE TESTING W REFLEX): HIV: NONREACTIVE

## 2012-11-18 LAB — GC/CHLAMYDIA PROBE AMP: GC Probe RNA: NEGATIVE

## 2012-11-18 MED ORDER — FERROUS SULFATE 325 (65 FE) MG PO TABS
325.0000 mg | ORAL_TABLET | Freq: Three times a day (TID) | ORAL | Status: DC
  Administered 2012-11-18 – 2012-11-20 (×7): 325 mg via ORAL
  Filled 2012-11-18 (×9): qty 1

## 2012-11-18 MED ORDER — FERUMOXYTOL INJECTION 510 MG/17 ML: 510.0000 mg | Freq: Once | INTRAVENOUS | Status: DC

## 2012-11-18 NOTE — Progress Notes (Signed)
MEDICATION RELATED CONSULT NOTE - INITIAL   Pharmacy Consult for Ferumoxytol Indication: Iron deficiency anemia  No Known Allergies  Patient Measurements: Height: 5\' 9"  (175.3 cm) Weight: 110 lb (49.896 kg) IBW/kg (Calculated) : 66.2  Vital Signs: Temp: 98.2 F (36.8 C) (04/29 0553) Temp src: Oral (04/29 0553) BP: 108/64 mmHg (04/29 0553) Pulse Rate: 62 (04/29 0553) Intake/Output from previous day:   Intake/Output from this shift:    Labs:  Recent Labs  11/17/12 0918 11/17/12 1635 11/18/12 0620  WBC 6.9  --  3.9*  HGB 9.2*  --  7.4*  HCT 28.0*  --  22.9*  PLT 461*  --  361  CREATININE 0.63 0.58 0.64  ALBUMIN 4.2  --   --   PROT 7.8  --   --   AST 29  --   --   ALT 8  --   --   ALKPHOS 60  --   --   BILITOT 0.4  --   --    Estimated Creatinine Clearance: 81.7 ml/min (by C-G formula based on Cr of 0.64).   Microbiology: No results found for this or any previous visit (from the past 720 hour(s)).  Medical History: History reviewed. No pertinent past medical history.  Medications:  Prescriptions prior to admission  Medication Sig Dispense Refill  . ibuprofen (ADVIL,MOTRIN) 200 MG tablet Take 400 mg by mouth every 6 (six) hours as needed for pain.        Assessment: 32 y.o. female presents with abd pain and nausea - found to have acute pancreatitis likely provoked by ETOH binge. Noted pt with Hgb down to 7.4 and iron studies show iron deficient anemia. Pharmacy asked to dose iron replacement.  Goal of Therapy:  Repletion of iron stores  Plan:  1. Ferumoxytol 510mg  IV x 1 today. 2. Suspect pt will need po iron as maintenance therapy after IV iron given. 3. Consider repeat iron studies in ~30 days to assess response. 4. Pharmacy will sign off for now - please reconsult if further help needed.  Thanks Christoper Fabian, PharmD, BCPS Clinical pharmacist, pager [phone] 11/18/2012,9:54 AM

## 2012-11-18 NOTE — Progress Notes (Addendum)
INITIAL NUTRITION ASSESSMENT  DOCUMENTATION CODES Per approved criteria  -Underweight, malnutrition suspected   INTERVENTION: 1.  General healthful diet; encouraged intake of food and beverages.  Encouraged well-balanced diet and promoted wt gain for pt with underweight BMI.  NUTRITION DIAGNOSIS: Underweight related to EtOH use, poor appetite as evidenced by pt statement.   Monitor:  1.  Food/Beverage; diet advancement with tolerance. Pt meeting >/=90% estimated needs.  Reason for Assessment: BMI  32 y.o. female  Admitting Dx: Acute pancreatitis  ASSESSMENT: Pt admitted with abdominal pain.  Pt reported increased alcohol use on admission.  Found to have pancreatitis with elevated lipase. Lipase     Component Value Date/Time   LIPASE 194* 11/17/2012 1610    RD drawn to chart due to pt underweight AEB BMI of 16.3.    Nutrition Focused Physical Exam:  Subcutaneous Fat:  Orbital Region: wnl Upper Arm Region: wnl Thoracic and Lumbar Region: wnl  Muscle:  Temple Region: wnl Clavicle Bone Region: wnl, some protrusion consistent with gender norm Clavicle and Acromion Bone Region: wnl Scapular Bone Region: n/a Dorsal Hand: n/a Patellar Region: wnl Anterior Thigh Region: n/a Posterior Calf Region: wnl  Edema: none present  NFPE shows no evidence of wasting and pt reports usual wt as 115 lbs.  Pt does endorse poor eating habits and reports she has always been thin.  Pt states she eats daily, however does not eat set meals, generally snacking/grazing.  Per chart review, pt weighed 125 lbs in 06/2012. Reviewed with pt who re-states that 115 lbs is her usual wt. No previous wt hx. Unable to dx with malnutrition based on current data, however poor nutrition status suspected based on social hx and nutrition-related assessment.  RD will continue to assess for underlying malnutrition.  Will also follow for diet progression.  Height: Ht Readings from Last 1 Encounters:  11/17/12 5'  9" (1.753 m)    Weight: Wt Readings from Last 1 Encounters:  11/17/12 110 lb (49.896 kg)    Ideal Body Weight: 145 lbs  % Ideal Body Weight: 75%  Wt Readings from Last 10 Encounters:  11/17/12 110 lb (49.896 kg)  09/17/12 120 lb 6.4 oz (54.613 kg)  07/18/12 125 lb 11.2 oz (57.017 kg)    Usual Body Weight: 115 lbs per pt  % Usual Body Weight: 95%  BMI:  Body mass index is 16.24 kg/(m^2).  Estimated Nutritional Needs: Kcal: 1500-1750 Protein: 65-75g Fluid: >1.5 L/day  Skin: intact  Diet Order: Clear Liquid  EDUCATION NEEDS: -Education needs addressed  No intake or output data in the 24 hours ending 11/18/12 1436  Last BM: 4/27  Labs:   Recent Labs Lab 11/17/12 0918 11/17/12 1635 11/18/12 0620  NA 134* 136 135  K 3.9 4.3 3.8  CL 99 105 106  CO2 20 21 21   BUN 5* 5* 3*  CREATININE 0.63 0.58 0.64  CALCIUM 9.8 8.5 8.5  GLUCOSE 106* 76 127*    CBG (last 3)  No results found for this basename: GLUCAP,  in the last 72 hours  Scheduled Meds: . enoxaparin (LOVENOX) injection  40 mg Subcutaneous Q24H  . ferrous sulfate  325 mg Oral TID AC  . folic acid  1 mg Oral Daily  . thiamine  100 mg Oral Daily   Or  . thiamine  100 mg Intravenous Daily    Continuous Infusions: . dextrose 5 % and 0.45% NaCl 1,000 mL with potassium chloride 20 mEq infusion 90 mL/hr at 11/18/12 0219  History reviewed. No pertinent past medical history.  Past Surgical History  Procedure Laterality Date  . Tubal ligation      Loyce Dys, MS RD LDN Clinical Inpatient Dietitian Pager: [phone] Weekend/After hours pager: [phone]

## 2012-11-18 NOTE — Progress Notes (Signed)
Subjective:    Interval Events:  Patient feels better but still has epigastric abdominal pain.  Last BM was 4/27.  She is hungry and wants to eat.    Objective:    Vital Signs:   Filed Vitals:   11/17/12 1738 11/17/12 2120 11/18/12 0238 11/18/12 0553  BP: 121/79 113/68 122/79 108/64  Pulse: 61 58 64 62  Temp: 98.3 F (36.8 C) 98.1 F (36.7 C) 98.2 F (36.8 C) 98.2 F (36.8 C)  TempSrc: Oral Oral Oral Oral  Resp: 18 14 17 16   Height:      Weight:      SpO2: 100% 100% 98% 100%    Weights: [employer]   11/17/12 1439  Weight: 110 lb (49.896 kg)    Intake/Output:  Last BM Date: 11/16/12   Physical Exam: GENERAL: alert and oriented; resting comfortably in bed and in no distress LUNGS: clear to auscultation bilaterally, normal work of breathing HEART: normal rate; regular rhythm ABDOMEN: tender with palpation of the epigastrium, normal bowel sounds    Labs: Basic Metabolic Panel: Lab 11/17/12 0918 11/17/12 1635 11/18/12 0620  NA 134* 136 135  K 3.9 4.3 3.8  CL 99 105 106  CO2 20 21 21   GLUCOSE 106* 76 127*  BUN 5* 5* 3*  CREATININE 0.63 0.58 0.64  CALCIUM 9.8 8.5 8.5    CBC: Lab 11/17/12 0918 11/18/12 0620  WBC 6.9 3.9*  NEUTROABS 5.1  --   HGB 9.2* 7.4*  HCT 28.0* 22.9*  MCV 70.9* 73.2*  PLT 461* 361    Anemia Panel  Component Value Date/Time   IRON 29* 11/17/2012 1635   TIBC 420 11/17/2012 1635   FERRITIN 7* 11/17/2012 1635   % SAT 7 11/17/2012 1635   FOLATE >20 11/17/2012 1635   B12 498 11/17/2012 1635     STD Screening Component Value   HIV PENDING   GC PENDING   CHLAMYDIA PENDING       Medications:    Infusions: . dextrose 5 % and 0.45% NaCl 1,000 mL with potassium chloride 20 mEq infusion 90 mL/hr at 11/18/12 0219     Scheduled Medications: . enoxaparin (LOVENOX) injection  40 mg Subcutaneous Q24H  . ferrous sulfate  325 mg Oral TID AC  . folic acid  1 mg Oral Daily  . thiamine  100 mg Oral Daily   Or  . thiamine   100 mg Intravenous Daily     PRN Medications: LORazepam, LORazepam, morphine injection, ondansetron (ZOFRAN) IV    Assessment/ Plan:    1.   Acute pancreatitis:  Lipase elevated at 194. Likely provoked by alcohol binge along with chronic alcohol abuse. Ultrasound of abdomen negative for stones. Calcium normal.  IV analgesia and maintenance fluids. - Advance to clears - Continue maintenance fluids until tolerating diet  2.   Alcohol abuse:  Patient drinks 3 beers a day along with binge drinking on the weekends. Social work consult for this. I provided the initial education on the length between alcohol abuse and pancreatitis. I advised her to stop drinking altogether. CIWA protocol.  3.   Anion gap metabolic acidosis, alcoholic ketoacidosis - resolved:  Anion gap 15. Bicarbonate 20. Greater than 80 ketones in urine. Glucose is 106. Gap closed after IV hydration.  4.   Iron deficiency anemia:  Hemoglobin 9.2 with an MCV of 70.9. Hemoglobin fell to 7.4 overnight.  Anemia panel consistent with iron deficiency, most likely from menstruation and poor diet. - Hold off  on IV iron - Start oral ferrous sulfate 325 mg TID with meals  5.   Prophylaxis:  Enoxaparin 40 mg [place] daily   6.   Disposition:  No PCP. Expected discharge date > 2 days.    Length of Stay: 1 days   Signed by:  Dorthula Rue. Earlene Plater, MD PGY-I, Internal Medicine Pager [phone]  11/18/2012, 11:16 AM

## 2012-11-18 NOTE — Progress Notes (Signed)
UR completed. Necie Wilcoxson RNBSN 

## 2012-11-18 NOTE — H&P (Signed)
INTERNAL MEDICINE TEACHING SERVICE Attending Admission Note  Date: 11/18/2012  Patient name: Kristina Moyer  Medical record number: [id number]  Date of birth: 1981/01/08    I have seen and evaluated Jaclyn Shaggy and discussed their care with the Residency Team.  32 yr old female with no reported pmhx presented with N/V and abdominal pain.  She states that she drinks 3 beers per day and possibly 3 shots of liquor per day, for many years. She states she began to have mid-abdominal pain without radiation but worsening over the past few days.  She tried taking ibuprofen without relief.  She admits she did some binge drinking recently.  She states this has happened before. Her pregnancy test was negative in the ED.  She was noted to have a lipase of 194, normal AST/ALT, and a microcytic anemia.  She reports a history of heavy menses.  U/S of abdomen did not show any significant abnormalities. This morning she states she feels well and is hungry. Her exam shows only minimal epigastric tenderness.  She denies any history of melena or hematochezia. She had evidence ketones in her urine, likely alcoholic in nature and possibly due to decreased PO intake over past few days.  Physical Exam: Blood pressure 109/70, pulse 59, temperature 98.2 F (36.8 C), temperature source Oral, resp. rate 18, height 5\' 9"  (1.753 m), weight 110 lb (49.896 kg), last menstrual period 11/03/2012, SpO2 100.00%.  General: Vital signs reviewed and noted. Well-developed, well-nourished, in no acute distress; alert, appropriate and cooperative throughout examination.  Head: Normocephalic, atraumatic.  Eyes: PERRL, EOMI, No signs of anemia or jaundince.  Nose: Mucous membranes moist, not inflammed, nonerythematous.  Throat: Oropharynx nonerythematous, no exudate appreciated.   Neck: No deformities, masses, or tenderness noted.Supple, No carotid Bruits, no JVD.  Lungs:  Normal respiratory effort. Clear to auscultation BL without  crackles or wheezes.  Heart: RRR. S1 and S2 normal without gallop, murmur, or rubs.  Abdomen:  Mild epigastric tenderness, no guarding, no distention. +BS. No rebound tenderness.  Extremities: No pretibial edema.  Neurologic: A&O X3, CN II - XII are grossly intact.   Skin: No visible rashes, scars.    Lab results: Results for orders placed during the hospital encounter of 11/17/12 (from the past 24 hour(s))  BASIC METABOLIC PANEL     Status: Abnormal   Collection Time    11/17/12  4:35 PM      Result Value Range   Sodium 136  135 - 145 mEq/L   Potassium 4.3  3.5 - 5.1 mEq/L   Chloride 105  96 - 112 mEq/L   CO2 21  19 - 32 mEq/L   Glucose, Bld 76  70 - 99 mg/dL   BUN 5 (*) 6 - 23 mg/dL   Creatinine, Ser 2.53  0.50 - 1.10 mg/dL   Calcium 8.5  8.4 - 66.4 mg/dL   GFR calc non Af Amer >90  >90 mL/min   GFR calc Af Amer >90  >90 mL/min  IRON AND TIBC     Status: Abnormal   Collection Time    11/17/12  4:35 PM      Result Value Range   Iron 29 (*) 42 - 135 ug/dL   TIBC 403  474 - 259 ug/dL   Saturation Ratios 7 (*) 20 - 55 %   UIBC 391  125 - 400 ug/dL  FERRITIN     Status: Abnormal   Collection Time    11/17/12  4:35  PM      Result Value Range   Ferritin 7 (*) 10 - 291 ng/mL  RETICULOCYTES     Status: Abnormal   Collection Time    11/17/12  4:35 PM      Result Value Range   Retic Ct Pct 1.3  0.4 - 3.1 %   RBC. 3.52 (*) 3.87 - 5.11 MIL/uL   Retic Count, Manual 45.8  19.0 - 186.0 K/uL  VITAMIN B12     Status: None   Collection Time    11/17/12  4:35 PM      Result Value Range   Vitamin B-12 498  211 - 911 pg/mL  FOLATE     Status: None   Collection Time    11/17/12  4:35 PM      Result Value Range   Folate >20.0    HIV ANTIBODY (ROUTINE TESTING)     Status: None   Collection Time    11/18/12  6:20 AM      Result Value Range   HIV NON REACTIVE  NON REACTIVE  BASIC METABOLIC PANEL     Status: Abnormal   Collection Time    11/18/12  6:20 AM      Result Value Range    Sodium 135  135 - 145 mEq/L   Potassium 3.8  3.5 - 5.1 mEq/L   Chloride 106  96 - 112 mEq/L   CO2 21  19 - 32 mEq/L   Glucose, Bld 127 (*) 70 - 99 mg/dL   BUN 3 (*) 6 - 23 mg/dL   Creatinine, Ser 1.61  0.50 - 1.10 mg/dL   Calcium 8.5  8.4 - 09.6 mg/dL   GFR calc non Af Amer >90  >90 mL/min   GFR calc Af Amer >90  >90 mL/min  CBC     Status: Abnormal   Collection Time    11/18/12  6:20 AM      Result Value Range   WBC 3.9 (*) 4.0 - 10.5 K/uL   RBC 3.13 (*) 3.87 - 5.11 MIL/uL   Hemoglobin 7.4 (*) 12.0 - 15.0 g/dL   HCT 04.5 (*) 40.9 - 81.1 %   MCV 73.2 (*) 78.0 - 100.0 fL   MCH 23.0 (*) 26.0 - 34.0 pg   MCHC 31.4  30.0 - 36.0 g/dL   RDW 91.4 (*) 78.2 - 95.6 %   Platelets 361  150 - 400 K/uL    Imaging results:  US Abdomen Complete  11/17/2012  *RADIOLOGY REPORT*  Clinical Data:  Abdominal pain, nausea, vomiting.  COMPLETE ABDOMINAL ULTRASOUND  Comparison:  CT 02/24/2004.  Findings:  Gallbladder:  No gallstones, gallbladder wall thickening, or pericholecystic fluid.  Common bile duct:   Normal caliber, 2 mm.  Liver:  No focal lesion identified.  Within normal limits in parenchymal echogenicity.  IVC:  Appears normal.  Pancreas:  No focal abnormality seen.  Spleen:  Within normal limits in size and echotexture.  Right Kidney:   Normal in size and parenchymal echogenicity.  No evidence of mass or hydronephrosis.  Left Kidney:  Normal in size and parenchymal echogenicity.  No evidence of mass or hydronephrosis.  Abdominal aorta:  No aneurysm identified.  IMPRESSION: Negative abdominal ultrasound.   Original Report Authenticated By: Charlett Nose, M.D.      Assessment and Plan: I agree with the formulated Assessment and Plan with the following changes: 32 yr old female with no reported pmhx presented with N/V and abdominal pain, with acute  alcoholic pancreatitis. -Advance diet today starting from clear liquids. -She had evidence of an AG of 15 on admission, likely alcoholic ketoacidosis,  resolved now to AG 8. -Alcohol cessation. -Microcytic anemia likely due to menstrual loss. She will need outpatient follow up. I don't think IV iron is indicated at this time. She can be started on Iron sulfate 325 mg po tid and followed up as outpatient to assure response. There is no evidence of bleeding. She understands that EtOH and NSAID use can lead to PUD, she will need to abstain from both.  She also understands she can develop chronic pancreatitis if continues to drink heavily.   The treatment plan was discussed in detail with the patient.  Alternatives to treatment, side effects, risks and benefits, and complications were discussed with the patient. Informed consent was obtained. The patient agrees to proceed with the current treatment plan.  Jonah Blue, [place] 4/29/20141:57 PM

## 2012-11-19 DIAGNOSIS — D509 Iron deficiency anemia, unspecified: Secondary | ICD-10-CM

## 2012-11-19 DIAGNOSIS — E871 Hypo-osmolality and hyponatremia: Secondary | ICD-10-CM

## 2012-11-19 DIAGNOSIS — E872 Acidosis, unspecified: Secondary | ICD-10-CM

## 2012-11-19 DIAGNOSIS — F1010 Alcohol abuse, uncomplicated: Secondary | ICD-10-CM

## 2012-11-19 LAB — CBC
HCT: 23.6 % — ABNORMAL LOW (ref 36.0–46.0)
Hemoglobin: 7.5 g/dL — ABNORMAL LOW (ref 12.0–15.0)
MCH: 23.4 pg — ABNORMAL LOW (ref 26.0–34.0)
MCHC: 31.8 g/dL (ref 30.0–36.0)
MCV: 73.5 fL — ABNORMAL LOW (ref 78.0–100.0)
Platelets: 339 K/uL (ref 150–400)
RBC: 3.21 MIL/uL — ABNORMAL LOW (ref 3.87–5.11)
RDW: 19.5 % — ABNORMAL HIGH (ref 11.5–15.5)
WBC: 4.2 K/uL (ref 4.0–10.5)

## 2012-11-19 NOTE — ED Provider Notes (Signed)
Medical screening examination/treatment/procedure(s) were performed by non-physician practitioner and as supervising physician I was immediately available for consultation/collaboration.   Suzi Roots, MD 11/19/12 [phone]

## 2012-11-19 NOTE — Progress Notes (Signed)
Subjective:    Interval Events:  Abdominal pain persists.  She tolerated a clear diet with dinner last night, but then tried eating crackers later last night and this caused more abdominal pain and nausea.  She is passing flatus but no BM.    Objective:    Vital Signs:   Filed Vitals:   11/18/12 1024 11/18/12 1417 11/18/12 2208 11/19/12 0643  BP: 109/70 107/63 98/56 92/57   Pulse: 59 67 61 63  Temp: 98.2 F (36.8 C) 98.4 F (36.9 C) 98.5 F (36.9 C) 98 F (36.7 C)  TempSrc: Oral Oral Oral Oral  Resp: 18 20 18 18   Height:      Weight:      SpO2: 100% 100% 100% 100%    Weights: [employer]   11/17/12 1439  Weight: 110 lb (49.896 kg)    Intake/Output:   Gross per 24 hour  Intake 1843.5 ml  Output      0 ml  Net 1843.5 ml     Last BM Date: 11/17/12   Physical Exam: GENERAL: alert and oriented; resting comfortably in bed and in no distress LUNGS: clear to auscultation bilaterally, normal work of breathing HEART: normal rate; regular rhythm; normal S1 and S2, no S3 or S4 appreciated; no murmurs, rubs, or clicks ABDOMEN: tender with palpation of the epigastrium, normal bowel sounds, no masses    Labs: CBC: Lab 11/17/12 0918 11/18/12 0620 11/19/12 0455  WBC 6.9 3.9* 4.2  HGB 9.2* 7.4* 7.5*  HCT 28.0* 22.9* 23.6*  MCV 70.9* 73.2* 73.5*  PLT 461* 361 339    Microbiology: URINE CULTURE     Status: FINAL   Collection Time    11/17/12 11:16 AM      Result Value Range Status   Culture NO GROWTH   Final   Report Status 11/18/2012 FINAL   Final  GC/CHLAMYDIA PROBE AMP     Status: FINAL   Collection Time    11/18/12  2:28 AM      Result Value Range Status   CT Probe RNA NEGATIVE  NEGATIVE Final   GC Probe RNA NEGATIVE  NEGATIVE Final      Medications:    Infusions: . dextrose 5 % and 0.45% NaCl 1,000 mL with potassium chloride 20 mEq infusion 90 mL/hr at 11/18/12 2237     Scheduled Medications: . enoxaparin (LOVENOX) injection  40 mg  Subcutaneous Q24H  . ferrous sulfate  325 mg Oral TID AC  . folic acid  1 mg Oral Daily  . thiamine  100 mg Oral Daily   Or  . thiamine  100 mg Intravenous Daily     PRN Medications: LORazepam, LORazepam, morphine injection, ondansetron (ZOFRAN) IV    Assessment/ Plan:    1.   Acute pancreatitis:  Lipase elevated at 194. Likely provoked by alcohol binge along with chronic alcohol abuse. Ultrasound of abdomen negative for stones. Calcium normal.  IV analgesia and maintenance fluids. - Continue clears for now - Continue maintenance fluids until tolerating diet  2.   Alcohol abuse:  Patient drinks 3 beers a day along with binge drinking on the weekends. Social work consult for this. I provided the initial education on the length between alcohol abuse and pancreatitis. I advised her to stop drinking altogether. CIWA protocol.  3.   Anion gap metabolic acidosis, alcoholic ketoacidosis - resolved:  Anion gap 15. Bicarbonate 20. Greater than 80 ketones in urine. Glucose is 106. Gap closed after IV hydration.  4.  Iron deficiency anemia:  Hemoglobin 9.2 with an MCV of 70.9 at admission. Hemoglobin fell to 7.4 yesterday but increased overnight to 7.5.  Anemia panel consistent with iron deficiency, most likely from menstruation and poor diet. - Start oral ferrous sulfate 325 mg TID with meals  5.   Prophylaxis:  Enoxaparin 40 mg [place] daily   6.   Disposition:  No PCP. Expected discharge date > 2 days.    Length of Stay: 2 days   Signed by:  Dorthula Rue. Earlene Plater, MD PGY-I, Internal Medicine Pager [phone]  11/19/2012, 7:57 AM

## 2012-11-19 NOTE — Progress Notes (Signed)
INTERNAL MEDICINE TEACHING SERVICE Attending Note  Date: 11/19/2012  Patient name: Kristina Moyer  Medical record number: [id number]  Date of birth: 09/22/1980    This patient has been seen and discussed with the house staff. Please see their note for complete details. I concur with their findings with the following additions/corrections: Abdominal pain still present, worsened with trying to advance her diet yesterday. Agree with slowing down advancing her diet.  Continue IVF.  Follow electrolytes.  Microcytic anemia is stable, agree with supplemental iron tid.   Abdominal exam with mild epigastric tenderness, no guarding and no distention.   The treatment plan was discussed in detail with the patient.  Alternatives to treatment, side effects, risks and benefits, and complications were discussed with the patient. Informed consent was obtained. The patient agrees to proceed with the current treatment plan.  Jonah Blue, DO  11/19/2012, 1:48 PM

## 2012-11-20 LAB — CBC
HCT: 23.3 % — ABNORMAL LOW (ref 36.0–46.0)
Hemoglobin: 7.5 g/dL — ABNORMAL LOW (ref 12.0–15.0)
RBC: 3.17 MIL/uL — ABNORMAL LOW (ref 3.87–5.11)
WBC: 3.4 10*3/uL — ABNORMAL LOW (ref 4.0–10.5)

## 2012-11-20 LAB — BASIC METABOLIC PANEL
BUN: <3 mg/dL — ABNORMAL LOW (ref 6–23)
Chloride: 107 mEq/L (ref 96–112)
GFR calc Af Amer: >90 mL/min (ref >90)
GFR calc non Af Amer: >90 mL/min (ref >90)
Potassium: 3.9 mEq/L (ref 3.5–5.1)
Sodium: 138 mEq/L (ref 135–145)

## 2012-11-20 LAB — PHOSPHORUS: Phosphorus: 3.2 mg/dL (ref 2.3–4.6)

## 2012-11-20 LAB — MAGNESIUM: Magnesium: 1.7 mg/dL (ref 1.5–2.5)

## 2012-11-20 MED ORDER — FERROUS SULFATE 325 (65 FE) MG PO TABS: 325.0000 mg | ORAL_TABLET | Freq: Three times a day (TID) | ORAL | Status: DC

## 2012-11-20 MED ORDER — HYDROCODONE-ACETAMINOPHEN 5-325 MG PO TABS: 1.0000 | ORAL_TABLET | ORAL | Status: DC | PRN

## 2012-11-20 MED ORDER — ONDANSETRON 4 MG PO TBDP: 4.0000 mg | ORAL_TABLET | Freq: Three times a day (TID) | ORAL | Status: DC | PRN

## 2012-11-20 MED ORDER — HYDROCODONE-ACETAMINOPHEN 5-325 MG PO TABS
1.0000 | ORAL_TABLET | ORAL | Status: DC | PRN
  Administered 2012-11-20: 1 via ORAL
  Filled 2012-11-20: qty 1

## 2012-11-20 NOTE — Progress Notes (Signed)
Patient discharged to home. Discharge teaching completed including medications, diet and symptom management.  Verbalizes understanding with no further questions.  Tolerating bland diet, no complaints of nausea.  Complained of mild cramping, with relief on PO pain med.  Vital signs stable. Discharged per wheelchair with family.

## 2012-11-20 NOTE — Discharge Summary (Signed)
Internal Medicine Teaching [facility] Discharge Note  Name: Kristina Moyer MRN: [id number] DOB: [birth year] 32 y.o.  Date of Admission: 11/17/2012  8:46 AM Date of Discharge: 11/20/2012 Attending Physician: Lars Mage, MD  Discharge Diagnosis: 1. Acute pancreatitis 2.  Alcohol abuse 3. Iron deficiency anemia 4. Increased anion gap metabolic acidosis 5. Resolved hyponatremia  Discharge Medications:   Medication List    STOP taking these medications       ibuprofen 200 MG tablet  Commonly known as:  ADVIL,MOTRIN      TAKE these medications       ferrous sulfate 325 (65 FE) MG tablet  Take 1 tablet (325 mg total) by mouth 3 (three) times daily before meals.     HYDROcodone-acetaminophen 5-325 MG per tablet  Commonly known as:  NORCO/VICODIN  Take 1 tablet by mouth every 4 (four) hours as needed.     ondansetron 4 MG disintegrating tablet  Commonly known as:  ZOFRAN-ODT  Take 1 tablet (4 mg total) by mouth every 8 (eight) hours as needed for nausea.        Disposition and follow-up:   Kristina Moyer was discharged from [facility] in stable condition.  At the hospital follow up visit please address  1) CBC with differential, CMET  2) Address alcohol cessation   Follow-up Appointments:  Discharge Orders   Future Appointments Provider Department Dept Phone   12/03/2012 3:30 PM Larey Seat, MD [facility] [phone]   Future Orders Complete By Expires     Discharge instructions  As directed     Comments:      Follow up with Internal Medicine Clinic 12/03/12 3:30 PM [phone] Ground Floor [facility] Please establish a primary doctor after your visit with the Internal Medicine Clinic   RESOURCE GUIDE  Chronic Pain Problems: Contact [facility]  [phone] Patients need to be referred by their primary care doctor.  Insufficient Money for Medicine: Contact United Way:  call "211."   No  Primary Care Doctor: Call Health Connect  [phone] - can help you locate a primary care doctor that  accepts your insurance, provides certain services, etc. Physician Referral Service- [phone]  Agencies that provide inexpensive medical care: [facility]  [phone] [facility] Internal Medicine  [phone] [facility]  [phone] [facility] (1-Rh)  [phone] Planned Parenthood  [phone] [facility] Child Clinic  [phone]  Medicaid-accepting [facility] Providers: [facility]- [address]  [phone], Mon-Fri 9am-7pm, Sat 9am-1pm [facility]- [address] [place], Suite [place]  [phone] [facility]- [address], Suite [place]  [phone] [facility]- [address]  [phone] Renaye Rakers- [address] [place], [address], [phone]  Only accepts [place] Access [place] patients after they have their name  applied to their card  Self Pay (no insurance) in [facility]: Sickle Cell Patients: Dr Willey Blade, [facility]  [address] [place], [phone] [facility] Urgent Care- [address] [place]  [phone]       [facility] [place]- [address]       -     [facility]- see information above (Speak to [employer] if you do not have insurance)       -  [employer]- [address],  [phone]       -  [facility]- [address] [phone]       -  Dr Julio Sicks-  [address], [place], [phone]       -  [facility] - [address] [phone]       -  [facility]- [address], [phone], also [address], [phone]       -    [facility]- [address] [place], [phone], 1st & 3rd Saturday        every month, 10am-1pm  [facility] [address] [place], [place] [address] [phone]  [facility] [address] [place], [place] [address] [phone]  1) Find a Doctor and Pay Out of Pocket Although you won't have to find out who is covered by your insurance plan, it is a good idea to ask around and get recommendations. You will then need to call the office and see if the doctor you have chosen will accept you as a new patient and what types of options they offer for patients who are self-pay. Some doctors offer discounts or will set up payment plans for their patients who do not have insurance, but you will need to ask so you aren't surprised when you get to your appointment.  2) Contact Your Local Health Department Not all health departments have doctors that can see patients for sick visits, but many do, so it is worth a call to see if yours does. If you don't know where your local health department is, you can check in your phone book. The CDC also has a tool to help you locate your state's health department, and many state websites also have listings of all of their local health departments.  3) Find a Walk-in Clinic If your illness is not likely to be very severe or complicated, you may want to try a walk in clinic. These are popping up all over the country in pharmacies, drugstores, and shopping centers. They're usually staffed by nurse practitioners or physician assistants that have been trained to treat common illnesses and complaints. They're usually fairly quick and inexpensive. However, if you have serious medical issues or chronic medical problems, these are probably not your best option  STD Testing [facility] Department of [facility] [place], STD Clinic, [address], [place], phone [phone] or [phone].  Monday - Friday, call for an appointment. [facility] Department of [employer], STD Clinic, [address], [place], phone [phone] or [phone].  Monday - Friday, call for an  appointment.  Abuse/Neglect: [facility] Child Abuse Hotline 904-257-8853 [facility] Child Abuse Hotline (857)[phone] (After Hours)  Emergency Shelter:  [facility] 515-156-3159  Maternity Homes: Room at the [facility] [facility] 3865168166 [facility] [phone]  MRSA Hotline #:   417-089-4517  Dental Assistance If unable to pay or uninsured, contact:  [facility]. to become qualified for the adult dental clinic.  Patients with Medicaid: [facility] [phone] W. Joellyn Quails, [phone] [address], [phone]  If unable to pay, or uninsured, contact [facility] [phone] in [place], [phone] in [facility]) to become qualified for the adult dental clinic  [facility] [address] [place], [place] [address] [phone] [email or web address]  Other Proofreader  Services: [facility]- [address] [place], [place], [address], [phone], Ext. [phone], 2nd and 4th Thursday of the month at 6:30am.  10 clients each day by appointment, can sometimes see walk-in patients if someone does not show for an appointment. [facility]- [address] [place], [place], [address], [phone] [facility] [address], [place], [place], [address], [phone] [facility] Health Department- [phone] [facility] Health Department- [phone] [facility] Health [phone]       Behavioral Health Resources in the [facility]  Intensive Outpatient Programs: [facility]      [address] [place], [place] [phone] Both a day and evening program       [facility] Outpatient     [address]        [place], [place] [address] [phone]         ADS: [facility] [address] [place] [phone]  [facility] Mental Health ACCESS LINE:  [phone] or [phone] [address] [place], [place] [address] [email or web address]   Substance Abuse Resources: Alcohol and Drug Services  [phone] [facility] [phone] [facility] [phone] Floydene Flock [phone] Residential & Outpatient Substance Abuse Program  [phone]  Psychological Services: [facility]  [phone] [facility]  [phone] [facility], [phone] [place]. [address] [place], ACCESS LINE: [phone] or [phone], [email or web address]  Mobile Crisis Teams:                                        Therapeutic Alternatives         Mobile Crisis Care Unit [phone]             [facility] [address] Ginette Otto [phone]                                         Interventionist [address] [place] [place] [phone]  Self-Help/Support Groups: Mental Health Assoc. of [employer] of support groups [phone] (call for more info)   Narcotics Anonymous (NA) Caring Services [address] [place] - 2 meetings at this location  Residential Treatment Programs:  [facility]      [address]        [place]       [phone]         [facility] [address] [place] [address] [place], [place]  [address] [phone]  [facility]  [address] [place], [place] [address] [phone] Admissions: 8am-3pm M-F  [facility]     [address]        [place], [place] [address]       [phone]         [facility] [address] [place], [place] [phone]  [facility] [address] [place], [place] [phone]  [facility] - Intensive Outpatient      [address]     [place],  [place]       [phone]         [facility] ([facility]     [address] [place], [place] [phone] or [phone]  Residential Treatment Services (RTS), Medicaid [address] [place], [place] [phone]  [facility] [address]                                               [address] [place] [phone]  [facility] Resources: [phone]               General Therapy                                                Angie Fava, PhD        [address] [place], [place] [address]         [phone]   Insurance  [facility]   [address] [place], [place] [address] [phone]  [facility] Recovery [address] [place], [place] [address] [phone] Insurance/Medicaid/sponsorship through [facility] and Families                                              [address]                                        [place], [place] [address]    Therapy/tele-psych/case         [phone]          [facility] [address], [place]  [address]  Adolescent/group home/case management [phone]                                           Creola Corn PhD       General therapy       Insurance   [phone]         Dr. Lolly Mustache, [place], M-F [phone]  [facility]  United [employer] [facility] Dept. [address]                 [address]         [address] [place] [address]  [place]                                               Cristobal Goldmann Phone:  [phone]  Phone:  [phone]                   Phone:  [phone]  [facility], [phone] [facility] [place]- [phone]       -     [facility] in [place], [address],             [phone],  [facility] Child Abuse Hotline [phone] or [phone] (After Hours)    Increase activity slowly  As directed        Consultations: Social work  Procedures Performed:  US Abdomen Complete  11/17/2012  *RADIOLOGY REPORT*  Clinical Data:  Abdominal pain, nausea, vomiting.  COMPLETE ABDOMINAL ULTRASOUND  Comparison:  CT 02/24/2004.  Findings:  Gallbladder:  No gallstones, gallbladder wall thickening, or pericholecystic fluid.  Common bile duct:   Normal caliber, 2 mm.  Liver:  No focal lesion identified.  Within normal limits in parenchymal echogenicity.  IVC:  Appears normal.  Pancreas:  No focal abnormality seen.  Spleen:  Within normal limits in size and echotexture.  Right Kidney:   Normal in size and parenchymal echogenicity.  No evidence of mass or hydronephrosis.  Left Kidney:  Normal in size and parenchymal echogenicity.  No evidence of mass or hydronephrosis.  Abdominal aorta:  No aneurysm identified.  IMPRESSION: Negative abdominal ultrasound.   Original Report Authenticated By: Charlett Nose, M.D.     2D Echo: none   Cardiac Cath: none  Admission HPI:  32 year old woman with no past medical history, presenting with 6 days of abdominal pain and one day of nausea and vomiting. Onset was 6 days ago. Patient admits to heavy alcohol abuse may be to blame. Quality described as cramping. Location is periumbilical and epigastric without radiation. Severity is now rated at 10/10. Since onset, pain has been gradually intensifying; but it has been constant since onset. Patient has tried ibuprofen without relief. No aggravating or alleviating factors. No other symptoms associated with this. Review of systems negative. Patient reports drinking 3 beers every day with binge drinking on weekends  Hospital Course by problem list: 1. Acute pancreatitis 2.  Alcohol abuse 3. Iron deficiency anemia 4. Increased anion gap metabolic acidosis 5. Resolved hyponatremia  31 y.o  with history of alcohol abuse presents for acute pancreatitis.   1. Acute pancreatitis  Lipase elevated at 194 on admission. Likely provoked by alcohol binge along with chronic alcohol abuse. Ultrasound of abdomen negative for stones.  Initially nothing by mouth then intravenous hydration.  By day of discharge she tolerated clears advanced diet to bland.  We discontinued morphine 2 mg q4 prn and changed to 5-325 Norco/Vicodin q 4 hours prn.  We educated the patient precipitating factors leading to pancreatitis (i.e alcohol) and she acknowledged understanding.    2. Alcohol abuse Patient drinks 3 beers a day along with binge drinking on the weekends. Social work consulted. CIWA protocol initiated and social work consulted   3. Iron deficiency anemia Hemoglobin 9.2 with an MCV of 70.9 at admission.  Hemoglobin stable 7.5 and patient is not symptomatic with shortness of breath or chest pain. A component of this may be diluational.  Anemia panel consistent with iron deficiency, most likely from menstruation, poor diet.  We initiated ferrous sulfate 325 mg TID with meals.   4. Anion gap metabolic acidosis, alcoholic ketoacidosis - resolved Anion gap 15. Bicarbonate 20. Greater than 80 ketones in urine. Glucose is  106. Gap closed after IV hydration.  5. F/E/N  -Hyponatremia, resolved.  -She was on D51/2 NS 20 meQ 90 cc/hr which was discontinued by discharge.   6. Prophylaxis:  -Enoxaparin 40 mg [place] daily     Discharge Vitals:  BP 102/53  Pulse 60  Temp(Src) 98.2 F (36.8 C) (Oral)  Resp 16  Ht 5\' 9"  (1.753 m)  Wt 110 lb (49.896 kg)  BMI 16.24 kg/m2  SpO2 99%  LMP 11/03/2012  Discharge physical exam:  General: resting in bed, NAD  HEENT: [place]/at, no scleral icterus  Cardiac: RRR, no rubs, murmurs or gallops  Pulm: clear to auscultation bilaterally, no wheezes, rales, or rhonchi  Abd: soft, nontender, nondistended, BS present  Ext: warm and well perfused, no pedal edema  Neuro: alert  and oriented X3, neurologically intact   Discharge Labs:  Results for JAMMY, STLOUIS (MRN [id number]) as of 11/20/2012 10:15  Ref. Range 11/17/2012 09:18 11/17/2012 16:35 11/18/2012 06:20 11/20/2012 08:00  Sodium Latest Range: 135-145 mEq/L 134 (L) 136 135 138  Potassium Latest Range: 3.5-5.1 mEq/L 3.9 4.3 3.8 3.9  Chloride Latest Range: 96-112 mEq/L 99 105 106 107  CO2 Latest Range: 19-32 mEq/L 20 21 21 24   BUN Latest Range: 6-23 mg/dL 5 (L) 5 (L) 3 (L) <3 (L)  Creatinine Latest Range: 0.50-1.10 mg/dL 2.84 1.32 4.40 1.02  Calcium Latest Range: 8.4-10.5 mg/dL 9.8 8.5 8.5 8.9  GFR calc non Af Amer Latest Range: >90 mL/min >90 >90 >90 >90  GFR calc Af Amer Latest Range: >90 mL/min >90 >90 >90 >90  Glucose Latest Range: 70-99 mg/dL 725 (H) 76 366 (H) 440 (H)  Phosphorus Latest Range: 2.3-4.6 mg/dL    3.2  Magnesium Latest Range: 1.5-2.5 mg/dL    1.7   Results for KINGA, CASSAR (MRN [id number]) as of 11/20/2012 10:15  Ref. Range 11/17/2012 16:35  Iron Latest Range: 42-135 ug/dL 29 (L)  UIBC Latest Range: 125-400 ug/dL 387  TIBC Latest Range: 250-470 ug/dL 564  Saturation Ratios Latest Range: 20-55 % 7 (L)  Ferritin Latest Range: 10-291 ng/mL 7 (L)  Folate No range found >20.0   Results for AYBREE, LANYON (MRN [id number]) as of 11/20/2012 10:15  Ref. Range 11/17/2012 16:35  Vitamin B-12 Latest Range: 211-911 pg/mL 498   Results for ANDRIAN, URBACH (MRN [id number]) as of 11/20/2012 10:15  Ref. Range 11/17/2012 09:18 11/17/2012 16:35 11/18/2012 06:20 11/19/2012 04:55 11/20/2012 08:00  WBC Latest Range: 4.0-10.5 K/uL 6.9  3.9 (L) 4.2 3.4 (L)  RBC Latest Range: 3.87-5.11 MIL/uL 3.95  3.13 (L) 3.21 (L) 3.17 (L)  Hemoglobin Latest Range: 12.0-15.0 g/dL 9.2 (L)  7.4 (L) 7.5 (L) 7.5 (L)  HCT Latest Range: 36.0-46.0 % 28.0 (L)  22.9 (L) 23.6 (L) 23.3 (L)  MCV Latest Range: 78.0-100.0 fL 70.9 (L)  73.2 (L) 73.5 (L) 73.5 (L)  MCH Latest Range: 26.0-34.0 pg 23.3 (L)  23.0 (L) 23.4 (L) 23.7 (L)  MCHC Latest Range:  30.0-36.0 g/dL 01.0  93.2 35.5 73.2  RDW Latest Range: 11.5-15.5 % 19.2 (H)  19.2 (H) 19.5 (H) 19.7 (H)  Platelets Latest Range: 150-400 K/uL 461 (H)  361 339 302  Neutrophils Relative Latest Range: 43-77 % 74      Lymphocytes Relative Latest Range: 12-46 % 20      Monocytes Relative Latest Range: 3-12 % 6      Eosinophils Relative Latest Range: 0-5 % 1      Basophils Relative Latest Range: 0-1 % 0  NEUT# Latest Range: 1.7-7.7 K/uL 5.1      Lymphocytes Absolute Latest Range: 0.7-4.0 K/uL 1.4      Monocytes Absolute Latest Range: 0.1-1.0 K/uL 0.4      Eosinophils Absolute Latest Range: 0.0-0.7 K/uL 0.0      Basophils Absolute Latest Range: 0.0-0.1 K/uL 0.0      RBC. Latest Range: 3.87-5.11 MIL/uL  3.52 (L)     Retic Ct Pct Latest Range: 0.4-3.1 %  1.3     Retic Count, Manual Latest Range: 19.0-186.0 K/uL  45.8      Results for MAKAELAH, CRANFIELD (MRN [id number]) as of 11/20/2012 10:15  Ref. Range 11/18/2012 02:28 11/18/2012 06:20  GC Probe RNA Latest Range: NEGATIVE  NEGATIVE   CT Probe RNA Latest Range: NEGATIVE  NEGATIVE   HIV Latest Range: NON REACTIVE   NON REACTIVE   Results for JERSEY, ESPINOZA (MRN [id number]) as of 11/20/2012 10:15  Ref. Range 11/17/2012 11:16 11/17/2012 11:24  Color, Urine Latest Range: YELLOW  YELLOW   APPearance Latest Range: CLEAR  CLOUDY (A)   Specific Gravity, Urine Latest Range: 1.005-1.030  1.025   pH Latest Range: 5.0-8.0  7.0   Glucose Latest Range: NEGATIVE mg/dL NEGATIVE   Bilirubin Urine Latest Range: NEGATIVE  NEGATIVE   Ketones, ur Latest Range: NEGATIVE mg/dL >24 (A)   Protein Latest Range: NEGATIVE mg/dL NEGATIVE   Urobilinogen, UA Latest Range: 0.0-1.0 mg/dL 1.0   Nitrite Latest Range: NEGATIVE  NEGATIVE   Leukocytes, UA Latest Range: NEGATIVE  MODERATE (A)   Hgb urine dipstick Latest Range: NEGATIVE  NEGATIVE   WBC, UA Latest Range: <3 WBC/hpf 7-10   RBC / HPF Latest Range: <3 RBC/hpf 0-2   Squamous Epithelial / LPF Latest Range: RARE  MANY (A)    Bacteria, UA Latest Range: RARE  FEW (A)   Preg Test, Ur Latest Range: NEGATIVE   NEGATIVE    Results for orders placed during the hospital encounter of 11/17/12 (from the past 24 hour(s))  BASIC METABOLIC PANEL     Status: Abnormal   Collection Time    11/20/12  8:00 AM      Result Value Range   Sodium 138  135 - 145 mEq/L   Potassium 3.9  3.5 - 5.1 mEq/L   Chloride 107  96 - 112 mEq/L   CO2 24  19 - 32 mEq/L   Glucose, Bld 115 (*) 70 - 99 mg/dL   BUN <3 (*) 6 - 23 mg/dL   Creatinine, Ser 4.01  0.50 - 1.10 mg/dL   Calcium 8.9  8.4 - 02.7 mg/dL   GFR calc non Af Amer >90  >90 mL/min   GFR calc Af Amer >90  >90 mL/min  CBC     Status: Abnormal   Collection Time    11/20/12  8:00 AM      Result Value Range   WBC 3.4 (*) 4.0 - 10.5 K/uL   RBC 3.17 (*) 3.87 - 5.11 MIL/uL   Hemoglobin 7.5 (*) 12.0 - 15.0 g/dL   HCT 25.3 (*) 66.4 - 40.3 %   MCV 73.5 (*) 78.0 - 100.0 fL   MCH 23.7 (*) 26.0 - 34.0 pg   MCHC 32.2  30.0 - 36.0 g/dL   RDW 47.4 (*) 25.9 - 56.3 %   Platelets 302  150 - 400 K/uL  MAGNESIUM     Status: None   Collection Time    11/20/12  8:00 AM  Result Value Range   Magnesium 1.7  1.5 - 2.5 mg/dL  PHOSPHORUS     Status: None   Collection Time    11/20/12  8:00 AM      Result Value Range   Phosphorus 3.2  2.3 - 4.6 mg/dL    Signed: Annett Gula 11/20/2012, 10:51 AM   Time Spent on Discharge: >30 minutes  Services Ordered on Discharge: none Equipment Ordered on Discharge: none

## 2012-11-20 NOTE — Progress Notes (Signed)
Subjective: Patient is passing gas no bowel movement as of yet declines laxative or prune juice.  She denies abdominal pain and states if she has any it would be 3/10.  Tolerating full liq diet. No nausea/vomiting  Objective: Vital signs in last 24 hours: Filed Vitals:   11/19/12 0643 11/19/12 1341 11/19/12 2154 11/20/12 0524  BP: 92/57 108/54 102/62 102/53  Pulse: 63 76 62 60  Temp: 98 F (36.7 C) 98.4 F (36.9 C) 98.2 F (36.8 C) 98.2 F (36.8 C)  TempSrc: Oral Oral Oral Oral  Resp: 18 18 16 16   Height:      Weight:      SpO2: 100% 99% 99% 99%   Weight change:   Intake/Output Summary (Last 24 hours) at 11/20/12 1012 Last data filed at 11/20/12 0555  Gross per 24 hour  Intake 2032.5 ml  Output      0 ml  Net 2032.5 ml   Vitals reviewed. General: resting in bed, NAD HEENT: [place]/at, no scleral icterus Cardiac: RRR, no rubs, murmurs or gallops Pulm: clear to auscultation bilaterally, no wheezes, rales, or rhonchi Abd: soft, nontender, nondistended, BS present Ext: warm and well perfused, no pedal edema Neuro: alert and oriented X3, neurologically intact   Lab Results: Basic Metabolic Panel:  Recent Labs Lab 11/18/12 0620 11/20/12 0800  NA 135 138  K 3.8 3.9  CL 106 107  CO2 21 24  GLUCOSE 127* 115*  BUN 3* <3*  CREATININE 0.64 0.64  CALCIUM 8.5 8.9  MG  --  1.7  PHOS  --  3.2   Liver Function Tests:  Recent Labs Lab 11/17/12 0918  AST 29  ALT 8  ALKPHOS 60  BILITOT 0.4  PROT 7.8  ALBUMIN 4.2    Recent Labs Lab 11/17/12 0918  LIPASE 194*   CBC:  Recent Labs Lab 11/17/12 0918  11/19/12 0455 11/20/12 0800  WBC 6.9  < > 4.2 3.4*  NEUTROABS 5.1  --   --   --   HGB 9.2*  < > 7.5* 7.5*  HCT 28.0*  < > 23.6* 23.3*  MCV 70.9*  < > 73.5* 73.5*  PLT 461*  < > 339 302  < > = values in this interval not displayed.  Anemia Panel:  Recent Labs Lab 11/17/12 1635  VITAMINB12 498  FOLATE >20.0  FERRITIN 7*  TIBC 420  IRON 29*  RETICCTPCT  1.3   Urinalysis:  Recent Labs Lab 11/17/12 1116  COLORURINE YELLOW  LABSPEC 1.025  PHURINE 7.0  GLUCOSEU NEGATIVE  HGBUR NEGATIVE  BILIRUBINUR NEGATIVE  KETONESUR >80*  PROTEINUR NEGATIVE  UROBILINOGEN 1.0  NITRITE NEGATIVE  LEUKOCYTESUR MODERATE*   Misc. Labs: none  Micro Results: Recent Results (from the past 240 hour(s))  URINE CULTURE     Status: None   Collection Time    11/17/12 11:16 AM      Result Value Range Status   Specimen Description URINE, RANDOM   Final   Special Requests NONE   Final   Culture  Setup Time 11/17/2012 12:14   Final   [place] Count NO GROWTH   Final   Culture NO GROWTH   Final   Report Status 11/18/2012 FINAL   Final  GC/CHLAMYDIA PROBE AMP     Status: None   Collection Time    11/18/12  2:28 AM      Result Value Range Status   CT Probe RNA NEGATIVE  NEGATIVE Final   GC Probe RNA NEGATIVE  NEGATIVE Final   Comment: (NOTE)                                                                                              Normal Reference Range: Negative          Assay performed using the Gen-Probe APTIMA COMBO2 (R) Assay.     Acceptable specimen types for this assay include APTIMA Swabs (Unisex,     endocervical, urethral, or vaginal), first void urine, and ThinPrep     liquid based cytology samples.   Studies/Results: No results found. Medications:  Scheduled Meds: . enoxaparin (LOVENOX) injection  40 mg Subcutaneous Q24H  . ferrous sulfate  325 mg Oral TID AC  . folic acid  1 mg Oral Daily  . thiamine  100 mg Oral Daily   Or  . thiamine  100 mg Intravenous Daily   Continuous Infusions: . dextrose 5 % and 0.45% NaCl 1,000 mL with potassium chloride 20 mEq infusion 90 mL/hr at 11/20/12 1000   PRN Meds:.LORazepam, LORazepam, morphine injection, ondansetron (ZOFRAN) IV Assessment/Plan: 32 y.o with history of alcohol abuse presents for acute pancreatitis.   1. Acute pancreatitis -Lipase elevated at 194 on admission.  -Likely  provoked by alcohol binge along with chronic alcohol abuse. Ultrasound of abdomen negative for stones.  -Tolerated clears advanced diet to bland -d/c morphine 2 mg q4 prn changed to 5-325 Norco/Vicodin q 4 hours prn -Educated patient precipitating factors leading to pancreatitis  2. Alcohol abuse:  -social work consulted -CIWA protocol.   3. Iron deficiency anemia:  -Hemoglobin 9.2 with an MCV of 70.9 at admission.  -Hemoglobin stable 7.5.  A component of this may be diluational  -Anemia panel consistent with iron deficiency, most likely from menstruation, poor diet.  -ferrous sulfate 325 mg TID with meals   4. F/E/N -dc D51/2 NS 20 meQ 90 cc/hr today -electrolytes wnl  -full liq diet changed to bland diet   5. Prophylaxis:  -Enoxaparin 40 mg [place] daily   Dispo: Disposition is deferred at this time, awaiting improvement of current medical problems.  Anticipated discharge today.   The patient does have a current PCP.  Therefore she will follow with [facility] once (12/03/12 at 3:30 PM with Dr. Collier Bullock) after discharge and then has to establish a PCP.   The patient does have transportation limitations that hinder transportation to clinic appointments.  .Services Needed at time of discharge: Y = Yes, Blank = No PT:   OT:   RN:   Equipment:   Other:     LOS: 3 days   Annett Gula [phone] 11/20/2012, 10:12 AM

## 2012-12-03 ENCOUNTER — Ambulatory Visit: Payer: Self-pay | Admitting: Internal Medicine

## 2013-02-24 ENCOUNTER — Emergency Department (HOSPITAL_COMMUNITY): Payer: Self-pay

## 2013-02-24 ENCOUNTER — Encounter (HOSPITAL_COMMUNITY): Payer: Self-pay | Admitting: *Deleted

## 2013-02-24 ENCOUNTER — Emergency Department (HOSPITAL_COMMUNITY)
Admission: EM | Admit: 2013-02-24 | Discharge: 2013-02-24 | Disposition: A | Discharge status: Home / Self Care | Payer: Self-pay | Attending: Emergency Medicine | Admitting: Emergency Medicine

## 2013-02-24 DIAGNOSIS — Z3202 Encounter for pregnancy test, result negative: Secondary | ICD-10-CM | POA: Insufficient documentation

## 2013-02-24 DIAGNOSIS — F17200 Nicotine dependence, unspecified, uncomplicated: Secondary | ICD-10-CM | POA: Insufficient documentation

## 2013-02-24 DIAGNOSIS — M549 Dorsalgia, unspecified: Secondary | ICD-10-CM | POA: Insufficient documentation

## 2013-02-24 DIAGNOSIS — K859 Acute pancreatitis without necrosis or infection, unspecified: Secondary | ICD-10-CM | POA: Insufficient documentation

## 2013-02-24 LAB — COMPREHENSIVE METABOLIC PANEL
Albumin: 4 g/dL (ref 3.5–5.2)
BUN: 7 mg/dL (ref 6–23)
Calcium: 9.6 mg/dL (ref 8.4–10.5)
Creatinine, Ser: 0.64 mg/dL (ref 0.50–1.10)
GFR calc Af Amer: >90 mL/min (ref >90)
Glucose, Bld: 86 mg/dL (ref 70–99)
Total Protein: 7.5 g/dL (ref 6.0–8.3)

## 2013-02-24 LAB — CBC WITH DIFFERENTIAL/PLATELET
Basophils Absolute: 0 10*3/uL (ref 0.0–0.1)
HCT: 30.2 % — ABNORMAL LOW (ref 36.0–46.0)
Hemoglobin: 9.7 g/dL — ABNORMAL LOW (ref 12.0–15.0)
Lymphocytes Relative: 14 % (ref 12–46)
Lymphs Abs: 0.8 10*3/uL (ref 0.7–4.0)
MCV: 76.8 fL — ABNORMAL LOW (ref 78.0–100.0)
Monocytes Absolute: 0.5 10*3/uL (ref 0.1–1.0)
Monocytes Relative: 8 % (ref 3–12)
Neutro Abs: 4.7 10*3/uL (ref 1.7–7.7)
RBC: 3.93 MIL/uL (ref 3.87–5.11)
RDW: 18.8 % — ABNORMAL HIGH (ref 11.5–15.5)
WBC: 6 10*3/uL (ref 4.0–10.5)

## 2013-02-24 LAB — URINALYSIS, ROUTINE W REFLEX MICROSCOPIC
Hgb urine dipstick: NEGATIVE
Nitrite: NEGATIVE
Protein, ur: NEGATIVE mg/dL
Specific Gravity, Urine: 1.02 (ref 1.005–1.030)
Urobilinogen, UA: 0.2 mg/dL (ref 0.0–1.0)

## 2013-02-24 LAB — URINE MICROSCOPIC-ADD ON

## 2013-02-24 LAB — POCT I-STAT TROPONIN I

## 2013-02-24 LAB — LIPASE, BLOOD: Lipase: 101 U/L — ABNORMAL HIGH (ref 11–59)

## 2013-02-24 MED ORDER — OXYCODONE-ACETAMINOPHEN 5-325 MG PO TABS
2.0000 | ORAL_TABLET | Freq: Once | ORAL | Status: AC
  Administered 2013-02-24: 2 via ORAL
  Filled 2013-02-24: qty 2

## 2013-02-24 MED ORDER — OXYCODONE-ACETAMINOPHEN 5-325 MG PO TABS: 1.0000 | ORAL_TABLET | Freq: Four times a day (QID) | ORAL | Status: DC | PRN

## 2013-02-24 MED ORDER — ONDANSETRON HCL 4 MG/2ML IJ SOLN
4.0000 mg | Freq: Once | INTRAMUSCULAR | Status: AC
  Administered 2013-02-24: 4 mg via INTRAVENOUS
  Filled 2013-02-24: qty 2

## 2013-02-24 MED ORDER — HYDROMORPHONE HCL PF 1 MG/ML IJ SOLN
1.0000 mg | Freq: Once | INTRAMUSCULAR | Status: AC
  Administered 2013-02-24: 1 mg via INTRAVENOUS
  Filled 2013-02-24: qty 1

## 2013-02-24 MED ORDER — SODIUM CHLORIDE 0.9 % IV BOLUS (SEPSIS)
1000.0000 mL | Freq: Once | INTRAVENOUS | Status: AC
  Administered 2013-02-24: 1000 mL via INTRAVENOUS

## 2013-02-24 MED ORDER — ONDANSETRON HCL 4 MG PO TABS
20.0000 mg | ORAL_TABLET | Freq: Four times a day (QID) | ORAL | Status: DC
Start: 2013-02-24 — End: 2013-10-01

## 2013-02-24 NOTE — ED Notes (Signed)
Pt discharged.Vital signs stable and GCS 15 

## 2013-02-24 NOTE — ED Provider Notes (Signed)
CSN: [id number]     Arrival date & time 02/24/13  4098 History     First MD Initiated Contact with Patient 02/24/13 1111     Chief Complaint  Patient presents with  . Abdominal Pain  . Back Pain   (Consider location/radiation/quality/duration/timing/severity/associated sxs/prior Treatment) HPI  Kristina Moyer is a 32 y.o.female with a significant PMH of pancreatitis, iron deficiency anemia, alcohol abuse presents to the ER with complaints of epigastric pain, nausea and a couple episodes of vomiting. Patient states that since her last admission in April 2014 she has significantly cut back on her alcohol usage. Her last drink she says was last night. Patient continues to drink alcohol despite it precipitating her pancreatic exacerbation. She says that this feels like her normal pancreatic exacerbation. She denies feeling weak, having fevers, diarrhea, coughing, shortness of breath, lethargy.   History reviewed. No pertinent past medical history. Past Surgical History  Procedure Laterality Date  . Tubal ligation     No family history on file. History  Substance Use Topics  . Smoking status: Current Every Day Smoker -- 0.50 packs/day    Types: Cigarettes  . Smokeless tobacco: Not on file  . Alcohol Use: 10.5 oz/week    21 drink(s) per week     Comment: 3 beers per day, binge drinking on weekends   OB History   Grav Para Term Preterm Abortions TAB SAB Ect Mult Living   4 3 3  1  1   3      Review of Systems ROS is negative unless otherwise stated in the HPI  Allergies  Review of patient's allergies indicates no known allergies.  Home Medications   Current Outpatient Rx  Name  Route  Sig  Dispense  Refill  . HYDROcodone-acetaminophen (NORCO/VICODIN) 5-325 MG per tablet   Oral   Take 1 tablet by mouth every 4 (four) hours as needed.   20 tablet   0   . ondansetron (ZOFRAN) 4 MG tablet   Oral   Take 5 tablets (20 mg total) by mouth every 6 (six) hours.   12 tablet   0   . oxyCODONE-acetaminophen (PERCOCET/ROXICET) 5-325 MG per tablet   Oral   Take 1-2 tablets by mouth every 6 (six) hours as needed for pain.   20 tablet   0    BP 120/82  Pulse 62  Temp(Src) 98.1 F (36.7 C) (Oral)  Resp 18  SpO2 99%  LMP 02/16/2013 Physical Exam  Nursing note and vitals reviewed. Constitutional: She appears well-developed and well-nourished. No distress.  HENT:  Head: Normocephalic and atraumatic.  Eyes: Pupils are equal, round, and reactive to light.  Neck: Normal range of motion. Neck supple.  Cardiovascular: Normal rate and regular rhythm.   Pulmonary/Chest: Effort normal.  Abdominal: Soft. There is tenderness in the epigastric area. There is no rigidity, no rebound, no guarding, no tenderness at McBurney's point and negative Murphy's sign.    Neurological: She is alert.  Skin: Skin is warm and dry.    ED Course   Procedures (including critical care time)  Labs Reviewed  LIPASE, BLOOD - Abnormal; Notable for the following:    Lipase 101 (*)    All other components within normal limits  URINALYSIS, ROUTINE W REFLEX MICROSCOPIC - Abnormal; Notable for the following:    APPearance HAZY (*)    Bilirubin Urine SMALL (*)    Ketones, ur 40 (*)    Leukocytes, UA SMALL (*)    All other  components within normal limits  CBC WITH DIFFERENTIAL - Abnormal; Notable for the following:    Hemoglobin 9.7 (*)    HCT 30.2 (*)    MCV 76.8 (*)    MCH 24.7 (*)    RDW 18.8 (*)    Neutrophils Relative % 78 (*)    All other components within normal limits  URINE MICROSCOPIC-ADD ON - Abnormal; Notable for the following:    Squamous Epithelial / LPF FEW (*)    All other components within normal limits  COMPREHENSIVE METABOLIC PANEL  ETHANOL  URINE RAPID DRUG SCREEN (HOSP PERFORMED)  POCT PREGNANCY, URINE  POCT I-STAT TROPONIN I   US Abdomen Complete  02/24/2013   *RADIOLOGY REPORT*  Clinical Data:  Epigastric pain and right upper quadrant pain.  COMPLETE  ABDOMINAL ULTRASOUND  Comparison:  11/17/2012 ultrasound.  Findings:  Gallbladder:  No gallstones, gallbladder wall thickening, or pericholecystic fluid.  Common bile duct:  1.8 mm.  Liver:  No focal lesion identified.  Within normal limits in parenchymal echogenicity.  IVC:  Appears normal.  Pancreas:  No focal abnormality seen.  Spleen:  5.6 cm.  No focal mass.  Right Kidney:  11.0 cm. No hydronephrosis or renal mass.  Left Kidney:  11.0 cm. No hydronephrosis or renal mass.  Abdominal aorta:  No aneurysm identified.  IMPRESSION: Negative abdominal ultrasound.   Original Report Authenticated By: Lacy Duverney, M.D.   1. Pancreatitis     MDM  Patient feeling much better after pain medications and fluids. No episodes of vomiting in the ED.  Her hemoglobin is around her baseline and her lipase is elevated at 101 which is improved from previous visit. The patients ABd US shows no acute abnormalities. The patient prefers to go home and try treatment as outpatient over being admitted. I discussed with patient that pancreatitis patients usually get admitted but she informs me that pain was not that bad and she prefers to go home. Tolerated fluid challenge here. Discussed clear liquid diet for a few days. Because her labs and [place], as well as vitals are stable, will not make her sign out AMA but she needs to come back to the ED if she develops fever or any any other red flag symptoms we discussed.  32 y.o.Kristina Moyer's evaluation in the Emergency Department is complete. It has been determined that no acute conditions requiring further emergency intervention are present at this time. The patient/guardian have been advised of the diagnosis and plan. We have discussed signs and symptoms that warrant return to the ED, such as changes or worsening in symptoms.  Vital signs are stable at discharge. Filed Vitals:   02/24/13 1439  BP: 120/82  Pulse: 62  Temp:   Resp: 18    Patient/guardian has voiced  understanding and agreed to follow-up with the PCP or specialist.   Dorthula Matas, PA-C 02/24/13 1524

## 2013-02-24 NOTE — ED Notes (Signed)
Pt is here with 2 day history of upper abdominal pain, back pain, and pain in shoulders.   Vomited times one, no diarrhea

## 2013-02-25 NOTE — ED Provider Notes (Signed)
Medical screening examination/treatment/procedure(s) were performed by non-physician practitioner and as supervising physician I was immediately available for consultation/collaboration.   Junius Argyle, MD 02/25/13 [phone]

## 2013-10-01 ENCOUNTER — Encounter (HOSPITAL_COMMUNITY): Payer: Self-pay | Admitting: Emergency Medicine

## 2013-10-01 DIAGNOSIS — Z3202 Encounter for pregnancy test, result negative: Secondary | ICD-10-CM | POA: Insufficient documentation

## 2013-10-01 DIAGNOSIS — K859 Acute pancreatitis without necrosis or infection, unspecified: Secondary | ICD-10-CM | POA: Insufficient documentation

## 2013-10-01 DIAGNOSIS — Z9851 Tubal ligation status: Secondary | ICD-10-CM | POA: Insufficient documentation

## 2013-10-01 DIAGNOSIS — F17200 Nicotine dependence, unspecified, uncomplicated: Secondary | ICD-10-CM | POA: Insufficient documentation

## 2013-10-01 DIAGNOSIS — R112 Nausea with vomiting, unspecified: Secondary | ICD-10-CM | POA: Insufficient documentation

## 2013-10-01 LAB — URINALYSIS, ROUTINE W REFLEX MICROSCOPIC
GLUCOSE, UA: NEGATIVE mg/dL
KETONES UR: 15 mg/dL — AB
Nitrite: NEGATIVE
PROTEIN: NEGATIVE mg/dL
Specific Gravity, Urine: 1.039 — ABNORMAL HIGH (ref 1.005–1.030)
UROBILINOGEN UA: 1 mg/dL (ref 0.0–1.0)
pH: 6 (ref 5.0–8.0)

## 2013-10-01 LAB — COMPREHENSIVE METABOLIC PANEL
ALK PHOS: 66 U/L (ref 39–117)
ALT: 8 U/L (ref 0–35)
AST: 18 U/L (ref 0–37)
Albumin: 3.9 g/dL (ref 3.5–5.2)
BUN: 5 mg/dL — ABNORMAL LOW (ref 6–23)
CALCIUM: 9.2 mg/dL (ref 8.4–10.5)
CHLORIDE: 98 meq/L (ref 96–112)
CO2: 24 meq/L (ref 19–32)
Creatinine, Ser: 0.49 mg/dL — ABNORMAL LOW (ref 0.50–1.10)
GLUCOSE: 96 mg/dL (ref 70–99)
POTASSIUM: 3.8 meq/L (ref 3.7–5.3)
SODIUM: 138 meq/L (ref 137–147)
Total Protein: 7.2 g/dL (ref 6.0–8.3)

## 2013-10-01 LAB — URINE MICROSCOPIC-ADD ON

## 2013-10-01 LAB — CBC WITH DIFFERENTIAL/PLATELET
BASOS ABS: 0 10*3/uL (ref 0.0–0.1)
Basophils Relative: 0 % (ref 0–1)
EOS PCT: 1 % (ref 0–5)
Eosinophils Absolute: 0 10*3/uL (ref 0.0–0.7)
HEMATOCRIT: 29.9 % — AB (ref 36.0–46.0)
Hemoglobin: 9.8 g/dL — ABNORMAL LOW (ref 12.0–15.0)
LYMPHS ABS: 1.5 10*3/uL (ref 0.7–4.0)
LYMPHS PCT: 18 % (ref 12–46)
MCH: 26.1 pg (ref 26.0–34.0)
MCHC: 32.8 g/dL (ref 30.0–36.0)
MCV: 79.7 fL (ref 78.0–100.0)
Monocytes Absolute: 0.6 10*3/uL (ref 0.1–1.0)
Monocytes Relative: 7 % (ref 3–12)
NEUTROS ABS: 6.4 10*3/uL (ref 1.7–7.7)
Neutrophils Relative %: 75 % (ref 43–77)
PLATELETS: 398 10*3/uL (ref 150–400)
RBC: 3.75 MIL/uL — AB (ref 3.87–5.11)
RDW: 16.4 % — ABNORMAL HIGH (ref 11.5–15.5)
WBC: 8.5 10*3/uL (ref 4.0–10.5)

## 2013-10-01 LAB — POC URINE PREG, ED: PREG TEST UR: NEGATIVE

## 2013-10-01 LAB — LIPASE, BLOOD: Lipase: 220 U/L — ABNORMAL HIGH (ref 11–59)

## 2013-10-01 NOTE — ED Notes (Signed)
Pt asked how much longer; pt updated.

## 2013-10-01 NOTE — ED Notes (Signed)
Pt. reports worsening upper abdominal pain / low back pain with emesis for 2 days , denies fever or chills. No diarrhea or urinary discomfort .

## 2013-10-02 ENCOUNTER — Emergency Department (HOSPITAL_COMMUNITY)
Admission: EM | Admit: 2013-10-02 | Discharge: 2013-10-02 | Disposition: A | Discharge status: Home / Self Care | Payer: Self-pay | Attending: Emergency Medicine | Admitting: Emergency Medicine

## 2013-10-02 DIAGNOSIS — K859 Acute pancreatitis without necrosis or infection, unspecified: Secondary | ICD-10-CM

## 2013-10-02 DIAGNOSIS — R112 Nausea with vomiting, unspecified: Secondary | ICD-10-CM

## 2013-10-02 MED ORDER — SODIUM CHLORIDE 0.9 % IV BOLUS (SEPSIS)
1000.0000 mL | Freq: Once | INTRAVENOUS | Status: AC
  Administered 2013-10-02: 1000 mL via INTRAVENOUS

## 2013-10-02 MED ORDER — OMEPRAZOLE 20 MG PO CPDR: 20.0000 mg | DELAYED_RELEASE_CAPSULE | Freq: Every day | ORAL | Status: DC

## 2013-10-02 MED ORDER — FAMOTIDINE IN NACL 20-0.9 MG/50ML-% IV SOLN
20.0000 mg | Freq: Once | INTRAVENOUS | Status: AC
  Administered 2013-10-02: 20 mg via INTRAVENOUS
  Filled 2013-10-02: qty 50

## 2013-10-02 MED ORDER — MORPHINE SULFATE 4 MG/ML IJ SOLN
4.0000 mg | Freq: Once | INTRAMUSCULAR | Status: AC
  Administered 2013-10-02: 4 mg via INTRAVENOUS
  Filled 2013-10-02: qty 1

## 2013-10-02 MED ORDER — HYDROCODONE-ACETAMINOPHEN 5-325 MG PO TABS: 2.0000 | ORAL_TABLET | ORAL | Status: DC | PRN

## 2013-10-02 MED ORDER — ONDANSETRON HCL 4 MG/2ML IJ SOLN
4.0000 mg | Freq: Once | INTRAMUSCULAR | Status: AC
  Administered 2013-10-02: 4 mg via INTRAVENOUS
  Filled 2013-10-02: qty 2

## 2013-10-02 MED ORDER — ONDANSETRON HCL 4 MG PO TABS
4.0000 mg | ORAL_TABLET | Freq: Four times a day (QID) | ORAL | Status: DC
Start: 2013-10-02 — End: 2018-11-06

## 2013-10-02 NOTE — ED Notes (Signed)
Dr Manly in room with pt.  

## 2013-10-02 NOTE — ED Notes (Signed)
Computer lock out pt's signature access and pt unable to sign for discharge.  Pt acknowledged receipt of discharge paperwork, and stated she had no additional questions at this time

## 2013-10-02 NOTE — Discharge Instructions (Signed)
Acute Pancreatitis °Acute pancreatitis is a disease in which the pancreas becomes suddenly inflamed. The pancreas is a large gland located behind your stomach. The pancreas produces enzymes that help digest food. The pancreas also releases the hormones glucagon and insulin that help regulate blood sugar. Damage to the pancreas occurs when the digestive enzymes from the pancreas are activated and begin attacking the pancreas before being released into the intestine. Most acute attacks last a couple of days and can cause serious complications. Some people become dehydrated and develop low blood pressure. In severe cases, bleeding into the pancreas can lead to shock and can be life-threatening. The lungs, heart, and kidneys may fail. °CAUSES  °Pancreatitis can happen to anyone. In some cases, the cause is unknown. Most cases are caused by: °· Alcohol abuse. °· Gallstones. °Other less common causes are: °· Certain medicines. °· Exposure to certain chemicals. °· Infection. °· Damage caused by an accident (trauma). °· Abdominal surgery. °SYMPTOMS  °· Pain in the upper abdomen that may radiate to the back. °· Tenderness and swelling of the abdomen. °· Nausea and vomiting. °DIAGNOSIS  °Your caregiver will perform a physical exam. Blood and stool tests may be done to confirm the diagnosis. Imaging tests may also be done, such as X-rays, CT scans, or an ultrasound of the abdomen. °TREATMENT  °Treatment usually requires a stay in the hospital. Treatment may include: °· Pain medicine. °· Fluid replacement through an intravenous line (IV). °· Placing a tube in the stomach to remove stomach contents and control vomiting. °· Not eating for 3 or 4 days. This gives your pancreas a rest, because enzymes are not being produced that can cause further damage. °· Antibiotic medicines if your condition is caused by an infection. °· Surgery of the pancreas or gallbladder. °HOME CARE INSTRUCTIONS  °· Follow the diet advised by your  caregiver. This may involve avoiding alcohol and decreasing the amount of fat in your diet. °· Eat smaller, more frequent meals. This reduces the amount of digestive juices the pancreas produces. °· Drink enough fluids to keep your urine clear or pale yellow. °· Only take over-the-counter or prescription medicines as directed by your caregiver. °· Avoid drinking alcohol if it caused your condition. °· Do not smoke. °· Get plenty of rest. °· Check your blood sugar at home as directed by your caregiver. °· Keep all follow-up appointments as directed by your caregiver. °SEEK MEDICAL CARE IF:  °· You do not recover as quickly as expected. °· You develop new or worsening symptoms. °· You have persistent pain, weakness, or nausea. °· You recover and then have another episode of pain. °SEEK IMMEDIATE MEDICAL CARE IF:  °· You are unable to eat or keep fluids down. °· Your pain becomes severe. °· You have a fever or persistent symptoms for more than 2 to 3 days. °· You have a fever and your symptoms suddenly get worse. °· Your skin or the white part of your eyes turn yellow (jaundice). °· You develop vomiting. °· You feel dizzy, or you faint. °· Your blood sugar is high (over 300 mg/dL). °MAKE SURE YOU:  °· Understand these instructions. °· Will watch your condition. °· Will get help right away if you are not doing well or get worse. °Document Released: 07/09/2005 Document Revised: 01/08/2012 Document Reviewed: 10/18/2011 °[employer]® Patient Information ©2014 [employer]. ° °

## 2013-10-02 NOTE — ED Notes (Signed)
Pt held down PO challenge of Ginger Ale

## 2013-10-02 NOTE — ED Provider Notes (Signed)
CSN: [id number]     Arrival date & time 10/01/13  1945 History   First MD Initiated Contact with Patient 10/02/13 0127     Chief Complaint  Patient presents with  . Abdominal Pain     (Consider location/radiation/quality/duration/timing/severity/associated sxs/prior Treatment) HPI  Patient is a 33 yo woman who reports a history of alcohol related pancreatitis. She presents with complaints of midline epigastric which radiates to her back. She has had associated nausea and has had about 6 episodes of NBNB emesis. No diarrhea. No fever. Her sx began 2 days ago after she had "a few drinks" to celebrate a friend's birthday. Sx feel like previous episodes of pancreatitis.   Pain is aching, 10/10, nonradiating. Patient took  Percocet PTA and says that she felt a little better after that but says the medicine has worn off now.   History reviewed. No pertinent past medical history. Past Surgical History  Procedure Laterality Date  . Tubal ligation     No family history on file. History  Substance Use Topics  . Smoking status: Current Every Day Smoker -- 0.50 packs/day    Types: Cigarettes  . Smokeless tobacco: Not on file  . Alcohol Use: 10.5 oz/week    21 drink(s) per week     Comment: 3 beers per day, binge drinking on weekends   OB History   Grav Para Term Preterm Abortions TAB SAB Ect Mult Living   4 3 3  1  1   3      Review of Systems  Ten point review of symptoms performed and is negative with the exception of symptoms noted above.   Allergies  Review of patient's allergies indicates no known allergies.  Home Medications   Current Outpatient Rx  Name  Route  Sig  Dispense  Refill  . oxyCODONE-acetaminophen (PERCOCET/ROXICET) 5-325 MG per tablet   Oral   Take 1-2 tablets by mouth every 6 (six) hours as needed for pain.   20 tablet   0    BP 125/80  Pulse 63  Temp(Src) 98.2 F (36.8 C) (Oral)  Resp 16  Ht 5\' 10"  (1.778 m)  Wt 127 lb (57.607 kg)  BMI 18.22 kg/m2   SpO2 100%  LMP 09/24/2013 Physical Exam Gen: well developed and well nourished appearing Head: NCAT Eyes: PERL, EOMI Nose: no epistaixis or rhinorrhea Mouth/throat: mucosa is moist and pink Neck: supple, no stridor Lungs: CTA B, no wheezing, rhonchi or rales CV: RRR, no murmur, extremities appear well perfused.  Abd: soft, tenderness over the midline epigastrium nondistended Back: no ttp, no cva ttp Skin: warm and dry Ext: normal to inspection, no dependent edema Neuro: CN ii-xii grossly intact, no focal deficits Psyche; normal affect,  calm and cooperative.   ED Course  Procedures (including critical care time) Labs Review Labs Reviewed  CBC WITH DIFFERENTIAL - Abnormal; Notable for the following:    RBC 3.75 (*)    Hemoglobin 9.8 (*)    HCT 29.9 (*)    RDW 16.4 (*)    All other components within normal limits  COMPREHENSIVE METABOLIC PANEL - Abnormal; Notable for the following:    BUN 5 (*)    Creatinine, Ser 0.49 (*)    Total Bilirubin <0.2 (*)    All other components within normal limits  LIPASE, BLOOD - Abnormal; Notable for the following:    Lipase 220 (*)    All other components within normal limits  URINALYSIS, ROUTINE W REFLEX MICROSCOPIC - Abnormal; Notable  for the following:    APPearance CLOUDY (*)    Specific Gravity, Urine 1.039 (*)    Hgb urine dipstick LARGE (*)    Bilirubin Urine SMALL (*)    Ketones, ur 15 (*)    Leukocytes, UA SMALL (*)    All other components within normal limits  URINE MICROSCOPIC-ADD ON - Abnormal; Notable for the following:    Squamous Epithelial / LPF MANY (*)    Bacteria, UA FEW (*)    All other components within normal limits  POC URINE PREG, ED     MDM   DDX: gastritis, PUD, GERD, pancreatitis, gallbladder disease, SBO, colitis, UTI, enteritis.   Patient with excacerbation of alcoholic pancreatitis. Lipase only mildly elevated. Patient feeling better after MS 4mg  IV, antiemetic and IVF. Tolerating po intake. Stable  for d/c with plan for outpatient f/u.     Brandt Loosen, MD 10/02/13 [phone]

## 2013-10-02 NOTE — ED Notes (Signed)
Pt st's pain in upper abd radiating into back, nausea with vomiting after eating greasy foods.

## 2014-05-24 ENCOUNTER — Encounter (HOSPITAL_COMMUNITY): Payer: Self-pay | Admitting: Emergency Medicine

## 2014-12-15 ENCOUNTER — Emergency Department (INDEPENDENT_AMBULATORY_CARE_PROVIDER_SITE_OTHER)
Admission: EM | Admit: 2014-12-15 | Discharge: 2014-12-15 | Disposition: A | Discharge status: Home / Self Care | Payer: Self-pay | Source: Home / Self Care | Attending: Family Medicine | Admitting: Family Medicine

## 2014-12-15 ENCOUNTER — Other Ambulatory Visit (HOSPITAL_COMMUNITY)
Admission: RE | Admit: 2014-12-15 | Discharge: 2014-12-15 | Disposition: A | Discharge status: Home / Self Care | Payer: Self-pay | Source: Ambulatory Visit | Attending: Family Medicine | Admitting: Family Medicine

## 2014-12-15 ENCOUNTER — Encounter (HOSPITAL_COMMUNITY): Payer: Self-pay | Admitting: Emergency Medicine

## 2014-12-15 DIAGNOSIS — N760 Acute vaginitis: Secondary | ICD-10-CM | POA: Insufficient documentation

## 2014-12-15 DIAGNOSIS — R112 Nausea with vomiting, unspecified: Secondary | ICD-10-CM

## 2014-12-15 DIAGNOSIS — Z113 Encounter for screening for infections with a predominantly sexual mode of transmission: Secondary | ICD-10-CM | POA: Insufficient documentation

## 2014-12-15 LAB — POCT PREGNANCY, URINE: Preg Test, Ur: NEGATIVE

## 2014-12-15 NOTE — ED Notes (Signed)
Pt states she woke up this morning with N&V and abdominal cramping with some diarrhea.  She states the vomiting has subsided but she is still nauseous and still has the abdominal cramping.

## 2014-12-15 NOTE — ED Provider Notes (Signed)
CSN: [id number]     Arrival date & time 12/15/14  1359 History   First MD Initiated Contact with Patient 12/15/14 1634     Chief Complaint  Patient presents with  . Nausea  . Emesis  . Abdominal Cramping   (Consider location/radiation/quality/duration/timing/severity/associated sxs/prior Treatment) Patient is a 34 y.o. female presenting with vaginal discharge. The history is provided by the patient. No language interpreter was used.  Vaginal Discharge Quality:  Unable to specify Severity:  Mild Timing:  Constant Chronicity:  New Relieved by:  Nothing Pt concerned about std.  No discharge.  Nausea and vomitting today  History reviewed. No pertinent past medical history. Past Surgical History  Procedure Laterality Date  . Tubal ligation     History reviewed. No pertinent family history. History  Substance Use Topics  . Smoking status: Current Every Day Smoker -- 1.00 packs/day    Types: Cigarettes  . Smokeless tobacco: Never Used  . Alcohol Use: Yes     Comment: occasional   OB History    Gravida Para Term Preterm AB TAB SAB Ectopic Multiple Living   4 3 3  1  1   3      Review of Systems  Genitourinary: Positive for vaginal discharge.  All other systems reviewed and are negative.   Allergies  Review of patient's allergies indicates no known allergies.  Home Medications   Prior to Admission medications   Medication Sig Start Date End Date Taking? Authorizing Provider  HYDROcodone-acetaminophen (NORCO/VICODIN) 5-325 MG per tablet Take 2 tablets by mouth every 4 (four) hours as needed. 10/02/13   Brandt LoosenJulie Manly, MD  omeprazole (PRILOSEC) 20 MG capsule Take 1 capsule (20 mg total) by mouth daily. 10/02/13   Brandt LoosenJulie Manly, MD  ondansetron (ZOFRAN) 4 MG tablet Take 1 tablet (4 mg total) by mouth every 6 (six) hours. 10/02/13   Brandt LoosenJulie Manly, MD  oxyCODONE-acetaminophen (PERCOCET/ROXICET) 5-325 MG per tablet Take 1-2 tablets by mouth every 6 (six) hours as needed for pain. 02/24/13    Tiffany Neva SeatGreene, PA-C   BP 129/71 mmHg  Pulse 78  Temp(Src) 98.8 F (37.1 C) (Oral)  Resp 16  SpO2 100%  LMP 11/03/2014 (Exact Date) Physical Exam  Constitutional: She appears well-developed and well-nourished.  HENT:  Head: Normocephalic and atraumatic.  Eyes: Conjunctivae are normal. Pupils are equal, round, and reactive to light.  Neck: Normal range of motion. Neck supple.  Cardiovascular: Normal rate.   Abdominal: Soft. There is no tenderness.  Genitourinary: Vaginal discharge found.  Vaginal discharge,  Thick white,  Adnexa no masses,  Cervix nontender  Musculoskeletal: Normal range of motion.  Skin: Skin is warm.    ED Course  Procedures (including critical care time) Labs Review Labs Reviewed - No data to display  Imaging Review No results found.   MDM   1. Nausea and vomiting, vomiting of unspecified type    Wet prep and gc and ct ordered.    Lonia SkinnerLeslie K Homa HillsSofia, PA-C 12/15/14 1723

## 2014-12-15 NOTE — Discharge Instructions (Signed)

## 2014-12-16 LAB — CERVICOVAGINAL ANCILLARY ONLY
CHLAMYDIA, DNA PROBE: NEGATIVE
Neisseria Gonorrhea: POSITIVE — AB
WET PREP (BD AFFIRM): POSITIVE — AB

## 2014-12-19 ENCOUNTER — Emergency Department (HOSPITAL_COMMUNITY)
Admission: EM | Admit: 2014-12-19 | Discharge: 2014-12-19 | Disposition: A | Discharge status: Home / Self Care | Payer: Medicaid Other | Attending: Emergency Medicine | Admitting: Emergency Medicine

## 2014-12-19 ENCOUNTER — Encounter (HOSPITAL_COMMUNITY): Payer: Self-pay | Admitting: *Deleted

## 2014-12-19 DIAGNOSIS — Z720 Tobacco use: Secondary | ICD-10-CM | POA: Insufficient documentation

## 2014-12-19 DIAGNOSIS — K852 Alcohol induced acute pancreatitis without necrosis or infection: Secondary | ICD-10-CM

## 2014-12-19 DIAGNOSIS — Z9851 Tubal ligation status: Secondary | ICD-10-CM | POA: Insufficient documentation

## 2014-12-19 DIAGNOSIS — Z3202 Encounter for pregnancy test, result negative: Secondary | ICD-10-CM | POA: Diagnosis not present

## 2014-12-19 DIAGNOSIS — R109 Unspecified abdominal pain: Secondary | ICD-10-CM | POA: Diagnosis present

## 2014-12-19 HISTORY — DX: Other chronic pancreatitis: K86.1

## 2014-12-19 LAB — URINALYSIS, ROUTINE W REFLEX MICROSCOPIC
Bilirubin Urine: NEGATIVE
Glucose, UA: NEGATIVE mg/dL
HGB URINE DIPSTICK: NEGATIVE
KETONES UR: NEGATIVE mg/dL
Nitrite: NEGATIVE
PROTEIN: NEGATIVE mg/dL
SPECIFIC GRAVITY, URINE: 1.023 (ref 1.005–1.030)
Urobilinogen, UA: 1 mg/dL (ref 0.0–1.0)
pH: 6.5 (ref 5.0–8.0)

## 2014-12-19 LAB — PREGNANCY, URINE: Preg Test, Ur: NEGATIVE

## 2014-12-19 LAB — CBC WITH DIFFERENTIAL/PLATELET
BASOS ABS: 0 10*3/uL (ref 0.0–0.1)
Basophils Relative: 0 % (ref 0–1)
EOS ABS: 0 10*3/uL (ref 0.0–0.7)
Eosinophils Relative: 1 % (ref 0–5)
HEMATOCRIT: 30.2 % — AB (ref 36.0–46.0)
HEMOGLOBIN: 9.7 g/dL — AB (ref 12.0–15.0)
LYMPHS PCT: 48 % — AB (ref 12–46)
Lymphs Abs: 2.3 10*3/uL (ref 0.7–4.0)
MCH: 25.9 pg — ABNORMAL LOW (ref 26.0–34.0)
MCHC: 32.1 g/dL (ref 30.0–36.0)
MCV: 80.7 fL (ref 78.0–100.0)
MONOS PCT: 7 % (ref 3–12)
Monocytes Absolute: 0.3 10*3/uL (ref 0.1–1.0)
Neutro Abs: 2.1 10*3/uL (ref 1.7–7.7)
Neutrophils Relative %: 44 % (ref 43–77)
PLATELETS: 337 10*3/uL (ref 150–400)
RBC: 3.74 MIL/uL — ABNORMAL LOW (ref 3.87–5.11)
RDW: 18.9 % — AB (ref 11.5–15.5)
WBC: 4.8 10*3/uL (ref 4.0–10.5)

## 2014-12-19 LAB — COMPREHENSIVE METABOLIC PANEL
ALBUMIN: 3.6 g/dL (ref 3.5–5.0)
ALK PHOS: 77 U/L (ref 38–126)
ALT: 12 U/L — ABNORMAL LOW (ref 14–54)
ANION GAP: 11 (ref 5–15)
AST: 23 U/L (ref 15–41)
BILIRUBIN TOTAL: 0.3 mg/dL (ref 0.3–1.2)
BUN: 9 mg/dL (ref 6–20)
CALCIUM: 8.3 mg/dL — AB (ref 8.9–10.3)
CO2: 20 mmol/L — ABNORMAL LOW (ref 22–32)
CREATININE: 0.73 mg/dL (ref 0.44–1.00)
Chloride: 107 mmol/L (ref 101–111)
GFR calc non Af Amer: >60 mL/min (ref >60)
GLUCOSE: 96 mg/dL (ref 65–99)
Potassium: 3.7 mmol/L (ref 3.5–5.1)
Sodium: 138 mmol/L (ref 135–145)
Total Protein: 6.6 g/dL (ref 6.5–8.1)

## 2014-12-19 LAB — URINE MICROSCOPIC-ADD ON

## 2014-12-19 LAB — ETHANOL: ALCOHOL ETHYL (B): 202 mg/dL — AB (ref <5)

## 2014-12-19 LAB — LIPASE, BLOOD: Lipase: 177 U/L — ABNORMAL HIGH (ref 22–51)

## 2014-12-19 MED ORDER — ONDANSETRON HCL 4 MG/2ML IJ SOLN
4.0000 mg | Freq: Once | INTRAMUSCULAR | Status: AC
  Administered 2014-12-19: 4 mg via INTRAVENOUS
  Filled 2014-12-19: qty 2

## 2014-12-19 MED ORDER — ONDANSETRON 4 MG PO TBDP: 4.0000 mg | ORAL_TABLET | Freq: Three times a day (TID) | ORAL | Status: DC | PRN

## 2014-12-19 MED ORDER — MORPHINE SULFATE 4 MG/ML IJ SOLN
4.0000 mg | INTRAMUSCULAR | Status: DC | PRN
Start: 2014-12-19 — End: 2014-12-19
  Administered 2014-12-19 (×2): 4 mg via INTRAVENOUS
  Filled 2014-12-19 (×2): qty 1

## 2014-12-19 MED ORDER — HYDROCODONE-ACETAMINOPHEN 5-325 MG PO TABS: 2.0000 | ORAL_TABLET | ORAL | Status: DC | PRN

## 2014-12-19 MED ORDER — PANTOPRAZOLE SODIUM 40 MG IV SOLR
40.0000 mg | Freq: Once | INTRAVENOUS | Status: AC
  Administered 2014-12-19: 40 mg via INTRAVENOUS
  Filled 2014-12-19: qty 40

## 2014-12-19 MED ORDER — SODIUM CHLORIDE 0.9 % IV SOLN
Freq: Once | INTRAVENOUS | Status: AC
  Administered 2014-12-19: 11:00:00 via INTRAVENOUS

## 2014-12-19 MED ORDER — SODIUM CHLORIDE 0.9 % IV BOLUS (SEPSIS)
1000.0000 mL | Freq: Once | INTRAVENOUS | Status: AC
  Administered 2014-12-19: 1000 mL via INTRAVENOUS

## 2014-12-19 NOTE — Discharge Instructions (Signed)

## 2014-12-19 NOTE — ED Provider Notes (Signed)
CSN: [id number]     Arrival date & time 12/19/14  0707 History   First MD Initiated Contact with Patient 12/19/14 0715     Chief Complaint  Patient presents with  . Abdominal Pain      HPI  Kristina Moyer presents for evaluation of abdominal pain and vomiting. She is alcoholic. She drinks almost every day. States he went several weeks without drinking but has been drinking again daily. Nausea vomiting. Some diarrhea and abdominal pain this morning presents here.  Denies being jaundice. No dark urine or light stools. No blood pus or mucus in the stools. Heme-negative nonbilious emesis. Past Medical History  Diagnosis Date  . Pancreatitis, chronic    Past Surgical History  Procedure Laterality Date  . Tubal ligation     No family history on file. History  Substance Use Topics  . Smoking status: Current Every Day Smoker -- 1.00 packs/day    Types: Cigarettes  . Smokeless tobacco: Never Used  . Alcohol Use: Yes     Comment: occasional   OB History    Gravida Para Term Preterm AB TAB SAB Ectopic Multiple Living   Review of Systems  Constitutional: Negative for fever, chills, diaphoresis, appetite change and fatigue.  HENT: Negative for mouth sores, sore throat and trouble swallowing.   Eyes: Negative for visual disturbance.  Respiratory: Negative for cough, chest tightness, shortness of breath and wheezing.   Cardiovascular: Negative for chest pain.  Gastrointestinal: Positive for nausea, vomiting and abdominal pain. Negative for diarrhea and abdominal distention.  Endocrine: Negative for polydipsia, polyphagia and polyuria.  Genitourinary: Negative for dysuria, frequency and hematuria.  Musculoskeletal: Negative for gait problem.  Skin: Negative for color change, pallor and rash.  Neurological: Negative for dizziness, syncope, light-headedness and headaches.  Hematological: Does not bruise/bleed easily.  Psychiatric/Behavioral: Negative for behavioral problems  and confusion.      Allergies  Review of patient's allergies indicates no known allergies.  Home Medications   Prior to Admission medications   Medication Sig Start Date End Date Taking? Authorizing Provider  ibuprofen (ADVIL,MOTRIN) 200 MG tablet Take 200 mg by mouth every 6 (six) hours as needed for moderate pain.   Yes Historical Provider, MD  HYDROcodone-acetaminophen (NORCO/VICODIN) 5-325 MG per tablet Take 2 tablets by mouth every 4 (four) hours as needed. 12/19/14   Rolland Porter, MD  omeprazole (PRILOSEC) 20 MG capsule Take 1 capsule (20 mg total) by mouth daily. Patient not taking: Reported on 12/19/2014 10/02/13   Brandt Loosen, MD  ondansetron (ZOFRAN ODT) 4 MG disintegrating tablet Take 1 tablet (4 mg total) by mouth every 8 (eight) hours as needed for nausea. 12/19/14   Rolland Porter, MD  ondansetron (ZOFRAN) 4 MG tablet Take 1 tablet (4 mg total) by mouth every 6 (six) hours. Patient not taking: Reported on 12/19/2014 10/02/13   Brandt Loosen, MD  oxyCODONE-acetaminophen (PERCOCET/ROXICET) 5-325 MG per tablet Take 1-2 tablets by mouth every 6 (six) hours as needed for pain. Patient not taking: Reported on 12/19/2014 02/24/13   Marlon Pel, PA-C   BP 113/76 mmHg  Pulse 63  Temp(Src) 98.6 F (37 C) (Oral)  Resp 15  SpO2 98%  LMP 12/03/2014 Physical Exam  Constitutional: She is oriented to person, place, and time. She appears well-developed and well-nourished. No distress.  HENT:  Head: Normocephalic.  Eyes: Conjunctivae are normal. Pupils are equal, round, and reactive to light. No scleral icterus.  Neck: Normal range of motion. Neck supple. No thyromegaly present.  Cardiovascular: Normal rate and regular rhythm.  Exam reveals no gallop and no friction rub.   No murmur heard. Pulmonary/Chest: Effort normal and breath sounds normal. No respiratory distress. She has no wheezes. She has no rales.  Abdominal: Soft. Bowel sounds are normal. She exhibits no distension. There is  tenderness. There is no rebound.  Diffuse epigastric tenderness. No guarding rebound or peritoneal irritation. Normal active bowel sounds.  Musculoskeletal: Normal range of motion.  Neurological: She is alert and oriented to person, place, and time.  Skin: Skin is warm and dry. No rash noted.  Psychiatric: She has a normal mood and affect. Her behavior is normal.    ED Course  Procedures (including critical care time) Labs Review Labs Reviewed  CBC WITH DIFFERENTIAL/PLATELET - Abnormal; Notable for the following:    RBC 3.74 (*)    Hemoglobin 9.7 (*)    HCT 30.2 (*)    MCH 25.9 (*)    RDW 18.9 (*)    Lymphocytes Relative 48 (*)    All other components within normal limits  COMPREHENSIVE METABOLIC PANEL - Abnormal; Notable for the following:    CO2 20 (*)    Calcium 8.3 (*)    ALT 12 (*)    All other components within normal limits  LIPASE, BLOOD - Abnormal; Notable for the following:    Lipase 177 (*)    All other components within normal limits  ETHANOL - Abnormal; Notable for the following:    Alcohol, Ethyl (B) 202 (*)    All other components within normal limits  URINALYSIS, ROUTINE W REFLEX MICROSCOPIC (NOT AT [facility]) - Abnormal; Notable for the following:    Leukocytes, UA SMALL (*)    All other components within normal limits  URINE MICROSCOPIC-ADD ON - Abnormal; Notable for the following:    Squamous Epithelial / LPF MANY (*)    Bacteria, UA FEW (*)    All other components within normal limits  PREGNANCY, URINE    Imaging Review No results found.   EKG Interpretation None      MDM   Final diagnoses:  Alcohol-induced acute pancreatitis   Patient medicated. Taking by mouth liquids. Elevated alcohol greater than 200. Lipase 177. Taking by mouth liquids. She states that she would really like to not stay in the hospital. Tolerating symptoms well. She is appropriate for outpatient treatment and she'll simple remain abstinent and maintain clear liquids. Given  prescriptions for pain medication and antiemetics. Astra return if any worsening of symptoms or intolerance of her condition at home.    Rolland PorterMark Amahia Madonia, MD 12/19/14 [phone]

## 2014-12-19 NOTE — ED Notes (Signed)
Per EMS- pt reports abdominal pain and back pain. Pt denies N/V/D. Pt has hx of pancreatitis. Pt states that this feels typical of same. Pt reports taking pain medication this morning with no relief. Pt unsure of what medication she took.

## 2014-12-20 ENCOUNTER — Observation Stay (HOSPITAL_COMMUNITY): Payer: Medicaid Other

## 2014-12-20 ENCOUNTER — Inpatient Hospital Stay (HOSPITAL_COMMUNITY)
Admission: EM | Admit: 2014-12-20 | Discharge: 2014-12-29 | DRG: 439 | Disposition: A | Discharge status: Home / Self Care | Payer: Medicaid Other | Attending: Family Medicine | Admitting: Family Medicine

## 2014-12-20 ENCOUNTER — Encounter (HOSPITAL_COMMUNITY): Payer: Self-pay | Admitting: Vascular Surgery

## 2014-12-20 DIAGNOSIS — K852 Alcohol induced acute pancreatitis: Secondary | ICD-10-CM

## 2014-12-20 DIAGNOSIS — F17210 Nicotine dependence, cigarettes, uncomplicated: Secondary | ICD-10-CM | POA: Diagnosis present

## 2014-12-20 DIAGNOSIS — K860 Alcohol-induced chronic pancreatitis: Secondary | ICD-10-CM | POA: Insufficient documentation

## 2014-12-20 DIAGNOSIS — D509 Iron deficiency anemia, unspecified: Secondary | ICD-10-CM | POA: Diagnosis present

## 2014-12-20 DIAGNOSIS — A5403 Gonococcal cervicitis, unspecified: Secondary | ICD-10-CM | POA: Diagnosis present

## 2014-12-20 DIAGNOSIS — F1010 Alcohol abuse, uncomplicated: Secondary | ICD-10-CM

## 2014-12-20 DIAGNOSIS — K859 Acute pancreatitis without necrosis or infection, unspecified: Secondary | ICD-10-CM | POA: Diagnosis present

## 2014-12-20 DIAGNOSIS — Z720 Tobacco use: Secondary | ICD-10-CM | POA: Insufficient documentation

## 2014-12-20 DIAGNOSIS — R109 Unspecified abdominal pain: Secondary | ICD-10-CM | POA: Diagnosis present

## 2014-12-20 DIAGNOSIS — A599 Trichomoniasis, unspecified: Secondary | ICD-10-CM | POA: Diagnosis present

## 2014-12-20 DIAGNOSIS — R1013 Epigastric pain: Secondary | ICD-10-CM

## 2014-12-20 DIAGNOSIS — R509 Fever, unspecified: Secondary | ICD-10-CM | POA: Insufficient documentation

## 2014-12-20 DIAGNOSIS — E876 Hypokalemia: Secondary | ICD-10-CM | POA: Diagnosis present

## 2014-12-20 DIAGNOSIS — D649 Anemia, unspecified: Secondary | ICD-10-CM | POA: Diagnosis present

## 2014-12-20 DIAGNOSIS — Z79891 Long term (current) use of opiate analgesic: Secondary | ICD-10-CM

## 2014-12-20 HISTORY — DX: Alcohol dependence, uncomplicated: F10.20

## 2014-12-20 LAB — I-STAT BETA HCG BLOOD, ED (MC, WL, AP ONLY): I-stat hCG, quantitative: <5 m[IU]/mL (ref <5)

## 2014-12-20 LAB — CBC WITH DIFFERENTIAL/PLATELET
Basophils Absolute: 0 10*3/uL (ref 0.0–0.1)
Basophils Relative: 0 % (ref 0–1)
Eosinophils Absolute: 0 10*3/uL (ref 0.0–0.7)
Eosinophils Relative: 1 % (ref 0–5)
HCT: 28.8 % — ABNORMAL LOW (ref 36.0–46.0)
Hemoglobin: 9.3 g/dL — ABNORMAL LOW (ref 12.0–15.0)
LYMPHS ABS: 1.2 10*3/uL (ref 0.7–4.0)
LYMPHS PCT: 15 % (ref 12–46)
MCH: 26.2 pg (ref 26.0–34.0)
MCHC: 32.3 g/dL (ref 30.0–36.0)
MCV: 81.1 fL (ref 78.0–100.0)
Monocytes Absolute: 0.5 10*3/uL (ref 0.1–1.0)
Monocytes Relative: 6 % (ref 3–12)
NEUTROS PCT: 78 % — AB (ref 43–77)
Neutro Abs: 6.1 10*3/uL (ref 1.7–7.7)
Platelets: 305 10*3/uL (ref 150–400)
RBC: 3.55 MIL/uL — ABNORMAL LOW (ref 3.87–5.11)
RDW: 18.7 % — ABNORMAL HIGH (ref 11.5–15.5)
WBC: 7.8 10*3/uL (ref 4.0–10.5)

## 2014-12-20 LAB — I-STAT CHEM 8, ED
BUN: 4 mg/dL — ABNORMAL LOW (ref 6–20)
CHLORIDE: 106 mmol/L (ref 101–111)
Calcium, Ion: 1.21 mmol/L (ref 1.12–1.23)
Creatinine, Ser: 0.5 mg/dL (ref 0.44–1.00)
Glucose, Bld: 120 mg/dL — ABNORMAL HIGH (ref 65–99)
HEMATOCRIT: 32 % — AB (ref 36.0–46.0)
HEMOGLOBIN: 10.9 g/dL — AB (ref 12.0–15.0)
POTASSIUM: 3.8 mmol/L (ref 3.5–5.1)
SODIUM: 136 mmol/L (ref 135–145)
TCO2: 19 mmol/L (ref 0–100)

## 2014-12-20 LAB — COMPREHENSIVE METABOLIC PANEL
ALBUMIN: 3.6 g/dL (ref 3.5–5.0)
ALK PHOS: 58 U/L (ref 38–126)
ALT: 11 U/L — AB (ref 14–54)
ANION GAP: 12 (ref 5–15)
AST: 21 U/L (ref 15–41)
CALCIUM: 8.8 mg/dL — AB (ref 8.9–10.3)
CO2: 19 mmol/L — ABNORMAL LOW (ref 22–32)
Chloride: 107 mmol/L (ref 101–111)
Creatinine, Ser: 0.62 mg/dL (ref 0.44–1.00)
GFR calc Af Amer: >60 mL/min (ref >60)
GFR calc non Af Amer: >60 mL/min (ref >60)
Glucose, Bld: 116 mg/dL — ABNORMAL HIGH (ref 65–99)
POTASSIUM: 3.4 mmol/L — AB (ref 3.5–5.1)
Sodium: 138 mmol/L (ref 135–145)
Total Bilirubin: 0.9 mg/dL (ref 0.3–1.2)
Total Protein: 6.1 g/dL — ABNORMAL LOW (ref 6.5–8.1)

## 2014-12-20 LAB — LIPASE, BLOOD: Lipase: 933 U/L — ABNORMAL HIGH (ref 22–51)

## 2014-12-20 LAB — ETHANOL: Alcohol, Ethyl (B): <5 mg/dL (ref <5)

## 2014-12-20 MED ORDER — ONDANSETRON HCL 4 MG PO TABS: 4.0000 mg | ORAL_TABLET | Freq: Four times a day (QID) | ORAL | Status: DC | PRN

## 2014-12-20 MED ORDER — CETYLPYRIDINIUM CHLORIDE 0.05 % MT LIQD
7.0000 mL | Freq: Two times a day (BID) | OROMUCOSAL | Status: DC
  Administered 2014-12-21 – 2014-12-23 (×5): 7 mL via OROMUCOSAL

## 2014-12-20 MED ORDER — LORAZEPAM 1 MG PO TABS: 1.0000 mg | ORAL_TABLET | Freq: Four times a day (QID) | ORAL | Status: AC | PRN

## 2014-12-20 MED ORDER — MORPHINE SULFATE 4 MG/ML IJ SOLN
4.0000 mg | Freq: Once | INTRAMUSCULAR | Status: AC
  Administered 2014-12-20: 4 mg via INTRAVENOUS
  Filled 2014-12-20: qty 1

## 2014-12-20 MED ORDER — HYDROMORPHONE HCL 1 MG/ML IJ SOLN
0.5000 mg | INTRAMUSCULAR | Status: DC | PRN
  Administered 2014-12-20 – 2014-12-28 (×18): 0.5 mg via INTRAVENOUS
  Filled 2014-12-20 (×19): qty 1

## 2014-12-20 MED ORDER — FOLIC ACID 1 MG PO TABS
1.0000 mg | ORAL_TABLET | Freq: Every day | ORAL | Status: DC
  Administered 2014-12-20 – 2014-12-29 (×10): 1 mg via ORAL
  Filled 2014-12-20 (×10): qty 1

## 2014-12-20 MED ORDER — DOCUSATE SODIUM 100 MG PO CAPS
100.0000 mg | ORAL_CAPSULE | Freq: Two times a day (BID) | ORAL | Status: DC
  Administered 2014-12-20 – 2014-12-29 (×18): 100 mg via ORAL
  Filled 2014-12-20 (×18): qty 1

## 2014-12-20 MED ORDER — ADULT MULTIVITAMIN W/MINERALS CH
1.0000 | ORAL_TABLET | Freq: Every day | ORAL | Status: DC
  Administered 2014-12-20 – 2014-12-29 (×10): 1 via ORAL
  Filled 2014-12-20 (×10): qty 1

## 2014-12-20 MED ORDER — NICOTINE 21 MG/24HR TD PT24
21.0000 mg | MEDICATED_PATCH | Freq: Every day | TRANSDERMAL | Status: DC
  Administered 2014-12-20 – 2014-12-29 (×10): 21 mg via TRANSDERMAL
  Filled 2014-12-20 (×10): qty 1

## 2014-12-20 MED ORDER — HYDROMORPHONE HCL 1 MG/ML IJ SOLN
1.0000 mg | Freq: Once | INTRAMUSCULAR | Status: AC
  Administered 2014-12-20: 1 mg via INTRAVENOUS
  Filled 2014-12-20: qty 1

## 2014-12-20 MED ORDER — CHLORHEXIDINE GLUCONATE 0.12 % MT SOLN
15.0000 mL | Freq: Two times a day (BID) | OROMUCOSAL | Status: DC
  Administered 2014-12-21 – 2014-12-23 (×6): 15 mL via OROMUCOSAL
  Filled 2014-12-20 (×6): qty 15

## 2014-12-20 MED ORDER — THIAMINE HCL 100 MG/ML IJ SOLN
100.0000 mg | Freq: Every day | INTRAMUSCULAR | Status: DC
  Filled 2014-12-20: qty 2

## 2014-12-20 MED ORDER — DIPHENHYDRAMINE HCL 12.5 MG/5ML PO ELIX
12.5000 mg | ORAL_SOLUTION | Freq: Once | ORAL | Status: AC
  Administered 2014-12-20: 12.5 mg via ORAL
  Filled 2014-12-20: qty 10

## 2014-12-20 MED ORDER — ONDANSETRON HCL 4 MG/2ML IJ SOLN
4.0000 mg | Freq: Once | INTRAMUSCULAR | Status: AC
  Administered 2014-12-20: 4 mg via INTRAVENOUS
  Filled 2014-12-20: qty 2

## 2014-12-20 MED ORDER — SODIUM CHLORIDE 0.9 % IV BOLUS (SEPSIS)
1000.0000 mL | Freq: Once | INTRAVENOUS | Status: AC
  Administered 2014-12-20: 1000 mL via INTRAVENOUS

## 2014-12-20 MED ORDER — ONDANSETRON HCL 4 MG/2ML IJ SOLN
4.0000 mg | Freq: Four times a day (QID) | INTRAMUSCULAR | Status: DC | PRN
  Administered 2014-12-21 (×2): 4 mg via INTRAVENOUS
  Filled 2014-12-20 (×2): qty 2

## 2014-12-20 MED ORDER — VITAMIN B-1 100 MG PO TABS
100.0000 mg | ORAL_TABLET | Freq: Every day | ORAL | Status: DC
  Administered 2014-12-20 – 2014-12-29 (×10): 100 mg via ORAL
  Filled 2014-12-20 (×10): qty 1

## 2014-12-20 MED ORDER — PNEUMOCOCCAL VAC POLYVALENT 25 MCG/0.5ML IJ INJ
0.5000 mL | INJECTION | INTRAMUSCULAR | Status: AC
  Administered 2014-12-21: 0.5 mL via INTRAMUSCULAR
  Filled 2014-12-20: qty 0.5

## 2014-12-20 MED ORDER — BISACODYL 10 MG RE SUPP: 10.0000 mg | Freq: Every day | RECTAL | Status: DC | PRN

## 2014-12-20 MED ORDER — HEPARIN SODIUM (PORCINE) 5000 UNIT/ML IJ SOLN
5000.0000 [IU] | Freq: Three times a day (TID) | INTRAMUSCULAR | Status: DC
  Administered 2014-12-20 – 2014-12-29 (×23): 5000 [IU] via SUBCUTANEOUS
  Filled 2014-12-20 (×23): qty 1

## 2014-12-20 MED ORDER — SODIUM CHLORIDE 0.9 % IV SOLN
INTRAVENOUS | Status: DC
  Administered 2014-12-20 – 2014-12-28 (×20): via INTRAVENOUS

## 2014-12-20 MED ORDER — HYDROCODONE-ACETAMINOPHEN 5-325 MG PO TABS
1.0000 | ORAL_TABLET | ORAL | Status: DC | PRN
  Administered 2014-12-20 – 2014-12-22 (×4): 2 via ORAL
  Filled 2014-12-20 (×5): qty 2

## 2014-12-20 MED ORDER — HYDROMORPHONE HCL 1 MG/ML IJ SOLN
0.5000 mg | Freq: Once | INTRAMUSCULAR | Status: AC
  Administered 2014-12-20: 0.5 mg via INTRAVENOUS
  Filled 2014-12-20: qty 1

## 2014-12-20 MED ORDER — LORAZEPAM 2 MG/ML IJ SOLN: 1.0000 mg | Freq: Four times a day (QID) | INTRAMUSCULAR | Status: AC | PRN

## 2014-12-20 MED ORDER — HYDROMORPHONE HCL 1 MG/ML IJ SOLN: 0.5000 mg | INTRAMUSCULAR | Status: DC | PRN

## 2014-12-20 NOTE — ED Notes (Signed)
Pt reports to the ED for eval of abd pain and N/V. She reports she was seen yesterday and dx with pancreatitis flare. She was d/c home with Vicodin rx but was unable to get it filled. She reports continued pain and N/V. Also reports some low back pain as well. Reports some streaks of blood in her emesis. Denies any fevers, chills, or diarrhea. Pt A&Ox4, resp e/u, and skin warm and dry.

## 2014-12-20 NOTE — ED Notes (Signed)
Admitting MD at bedside.

## 2014-12-20 NOTE — Progress Notes (Signed)
Pt arrived to 6N17 via stretcher from the ED.  Pt ambulated from the stretcher to the bed without any difficulty.  Pt oriented to call bell, room, dept [address] [place].  VSS.  Will cont to monitor

## 2014-12-20 NOTE — ED Notes (Signed)
Lipase > 400 u/L at least per lab they are having instrument issues so they are going to have to dilute sample and rerun it.

## 2014-12-20 NOTE — ED Notes (Signed)
Lipase 933 u/L ED PA aware.

## 2014-12-20 NOTE — H&P (Signed)
Family Medicine Teaching [facility] Admission History and Physical Service Pager: [phone]  Patient name: Kristina Moyer Medical record number: [id number] Date of birth: Mar 17, 1981 Age: 34 y.o. Gender: female  Primary Care Provider: No PCP Per Patient Consultants: none Code Status: Full  Chief Complaint: abdominal pain  Assessment and Plan: Kristina Moyer is a 34 y.o. female presenting with abdominal pain . PMH is significant for ETOH abuse, chronic pancreatitis, Iron deficiency anemia  Abdominal pain/Acute on chronic pancreatitis: Lipase 933 (177 yesterday in ED), ETOH <5, LFTs normal, no leukocytosis, Bili 0.9, BUN<5, ionized Ca 1.21.  Afebrile, BP normal, HR 50-70.  Epigastric pain with some guarding only in epigastrium.  Hemodynamically stable, Non toxic appearing.  -Place in observation under Dr Gwendolyn Grant for pain management and monitoring -VS per floor protocol -NPO for now -abdominal ultrasound -Consider Abd CT if patient worsens. ALT normal therefore, do not suspect gallstone pancreatitis at this time. -Norco  1-2tabs q4 PRN, Dilaudid 0.5mg  IV q4 PRN severe pain -Zofran PRN nausea -Repeat CMET in am  ETOH abuse: Drinks 2 margarita drinks a day on average 5x/week.  Had a bender this Saturday where she admits to drinking upwards of 6 alcoholic beverages. -CIWA protocol -c/s Social Work  -continued alcohol cessation counseling, esp in light of chronic pancreatitis  Tobacco dependence: Smokes 1ppd -Nicotine patch PRN -Continue smoking cessation counseling, esp in light of chronic pancreatitis  FEN/GI: NS /h, NPO  Prophylaxis: Sub-q heparin  Disposition: Place in observation for pain management.  Expect to d/c home in next couple of days if improvement in pain.  History of Present Illness: Kristina Moyer is a 34 y.o. female presenting with abdominal pain, N/V.   Patient reports that she started vomiting yesterday.  She has had 3 episodes of vomiting before  arriving to hospital and once here in ED. She reports that she noticed some blood in the most recent vomit.  No diarrhea, no fevers, no sick contacts. She reports that she has a history of chronic pancreatitis but hasn't bothered her recently.  She reports that she usually has about 2 tall beers/margaritas.  Last drink was Saturday (drank "a lot" because she was at a cookout).  She reports having been to the ED a few times over the last week for abdominal pain.  She reports being seen yesterday and sent home with pain medication that she did not obtain because she had no ride to the pharmacy.  She reports that she took 2 of her uncle's hydrocodones and that this did not relieve pain at all. Has not tried taking anything for the vomiting.  She has been just not eating as much.  No CP, no SOB, no dizziness, no dysuria. Was tremulous earlier shortly after vomiting but this has improved.   Review Of Systems: Per HPI with the following additions: none Otherwise 12 point review of systems was performed and was unremarkable.  Patient Active Problem List   Diagnosis Date Noted  . Pancreatitis 12/20/2014  . Acute pancreatitis 11/17/2012  . Iron deficiency anemia 11/17/2012  . Increased anion gap metabolic acidosis 11/17/2012  . Alcohol abuse 11/17/2012   Past Medical History: Past Medical History  Diagnosis Date  . Pancreatitis, chronic   . Alcoholism    Past Surgical History: Past Surgical History  Procedure Laterality Date  . Tubal ligation     Social History: History  Substance Use Topics  . Smoking status: Current Every Day Smoker -- 1.00 packs/day    Types: Cigarettes  .  Smokeless tobacco: Never Used  . Alcohol Use: Yes     Comment: occasional   Additional social history: denies any illicit drug use, endorses 1ppd smoking and daily ETOH use Please also refer to relevant sections of EMR.  Family History: History reviewed. No pertinent family history. Allergies and Medications: No  Known Allergies No current facility-administered medications on file prior to encounter.   Current Outpatient Prescriptions on File Prior to Encounter  Medication Sig Dispense Refill  . HYDROcodone-acetaminophen (NORCO/VICODIN) 5-325 MG per tablet Take 2 tablets by mouth every 4 (four) hours as needed. 10 tablet 0  . ibuprofen (ADVIL,MOTRIN) 200 MG tablet Take 200 mg by mouth every 6 (six) hours as needed for moderate pain.    Marland Kitchen. ondansetron (ZOFRAN ODT) 4 MG disintegrating tablet Take 1 tablet (4 mg total) by mouth every 8 (eight) hours as needed for nausea. 20 tablet 0  . omeprazole (PRILOSEC) 20 MG capsule Take 1 capsule (20 mg total) by mouth daily. (Patient not taking: Reported on 12/19/2014) 30 capsule 0  . ondansetron (ZOFRAN) 4 MG tablet Take 1 tablet (4 mg total) by mouth every 6 (six) hours. (Patient not taking: Reported on 12/19/2014) 18 tablet 0  . oxyCODONE-acetaminophen (PERCOCET/ROXICET) 5-325 MG per tablet Take 1-2 tablets by mouth every 6 (six) hours as needed for pain. (Patient not taking: Reported on 12/19/2014) 20 tablet 0    Objective: BP 122/73 mmHg  Pulse 50  Temp(Src) 97.8 F (36.6 C) (Oral)  Resp 16  SpO2 100%  LMP 12/03/2014 Exam: General: awake, alert female, NAD Eyes: EOMI, no icterus ENTM: o/p clear, dentition fair, o/p clear Neck: no LAD Cardiovascular: bradycardic but regular rhythm, no murmurs, +2 radial pulses Respiratory: CTAB, no increased WOB, no wheeze Abdomen: soft, mild epigastric TTP with guarding, no rebound, +BS MSK: moves extremities independently, WWP, no edema Skin: dry, intact, no rashes Neuro: alert and oriented. follows commands, PERRLA, Negative asterixis Psych: speech normal, mood stable  Labs and Imaging: CBC BMET   Recent Labs Lab 12/20/14 1034 12/20/14 1446  WBC 7.8  --   HGB 9.3* 10.9*  HCT 28.8* 32.0*  PLT 305  --     Recent Labs Lab 12/20/14 1034 12/20/14 1446  NA 138 136  K 3.4* 3.8  CL 107 106  CO2 19*  --    BUN <5* 4*  CREATININE 0.62 0.50  GLUCOSE 116* 120*  CALCIUM 8.8*  --      Lipase 933 ALT 11 AST 21 Albumin 3.6 Ca ionized 1.21 ETOH <5 UPreg neg  Raliegh IpAshly M Gottschalk, DO 12/20/2014, 3:12 PM PGY-1, [facility]PT[facility] Intern pager: [phone], text pages welcome  I have seen and examined the patient. I have read and agree with the above note. My changes are noted in blue.  Tawni CarnesAndrew Clyda Smyth, MD 12/20/2014, 5:06 PM PGY-2, [facility] Pager: [phone], text pages welcome

## 2014-12-20 NOTE — ED Provider Notes (Signed)
CSN: [id number]     Arrival date & time 12/20/14  0950 History   First MD Initiated Contact with Patient 12/20/14 [id number]     Chief Complaint  Patient presents with  . Abdominal Pain  . Nausea  . Emesis     (Consider location/radiation/quality/duration/timing/severity/associated sxs/prior Treatment) Patient is a 34 y.o. female presenting with abdominal pain and vomiting. The history is provided by the patient and medical records.  Abdominal Pain Associated symptoms: nausea and vomiting   Emesis Associated symptoms: abdominal pain    34 year old female here with abdominal pain. She was seen in the ED yesterday for the same and was offered admission but decided to go home. She has history of chronic pancreatitis secondary to alcoholism. She denies alcohol use in the past 2 days. She reports ongoing epigastric pain as well as nausea and vomiting. She states this morning she had streaks of blood in her emesis. She denies any blood in her stool. She also reports some low back pain as well which she thinks is secondary to vomiting throughout the night. She denies any fever or chills. No urinary symptoms. Patient is status post tubal ligation.  She states she was unable to get her vicodin filled from yesterday's visit but does not state why.  She simply states "the pharmacy would not fill it".  VSS.  Past Medical History  Diagnosis Date  . Pancreatitis, chronic    Past Surgical History  Procedure Laterality Date  . Tubal ligation     History reviewed. No pertinent family history. History  Substance Use Topics  . Smoking status: Current Every Day Smoker -- 1.00 packs/day    Types: Cigarettes  . Smokeless tobacco: Never Used  . Alcohol Use: Yes     Comment: occasional   OB History    Gravida Para Term Preterm AB TAB SAB Ectopic Multiple Living   Review of Systems  Gastrointestinal: Positive for nausea, vomiting and abdominal pain.  All other systems reviewed and  are negative.     Allergies  Review of patient's allergies indicates no known allergies.  Home Medications   Prior to Admission medications   Medication Sig Start Date End Date Taking? Authorizing Provider  HYDROcodone-acetaminophen (NORCO/VICODIN) 5-325 MG per tablet Take 2 tablets by mouth every 4 (four) hours as needed. 12/19/14   Rolland Porter, MD  ibuprofen (ADVIL,MOTRIN) 200 MG tablet Take 200 mg by mouth every 6 (six) hours as needed for moderate pain.    Historical Provider, MD  omeprazole (PRILOSEC) 20 MG capsule Take 1 capsule (20 mg total) by mouth daily. Patient not taking: Reported on 12/19/2014 10/02/13   Brandt Loosen, MD  ondansetron (ZOFRAN ODT) 4 MG disintegrating tablet Take 1 tablet (4 mg total) by mouth every 8 (eight) hours as needed for nausea. 12/19/14   Rolland Porter, MD  ondansetron (ZOFRAN) 4 MG tablet Take 1 tablet (4 mg total) by mouth every 6 (six) hours. Patient not taking: Reported on 12/19/2014 10/02/13   Brandt Loosen, MD  oxyCODONE-acetaminophen (PERCOCET/ROXICET) 5-325 MG per tablet Take 1-2 tablets by mouth every 6 (six) hours as needed for pain. Patient not taking: Reported on 12/19/2014 02/24/13   Marlon Pel, PA-C   BP 111/87 mmHg  Pulse 63  Temp(Src) 97.8 F (36.6 C) (Oral)  Resp 18  SpO2 100%  LMP 12/03/2014   Physical Exam  Constitutional: She is oriented to person, place, and time. She appears  well-developed and well-nourished. No distress.  Hunched over, rocking back and forth in bed  HENT:  Head: Normocephalic and atraumatic.  Mouth/Throat: Oropharynx is clear and moist.  Eyes: Conjunctivae and EOM are normal. Pupils are equal, round, and reactive to light.  Neck: Normal range of motion. Neck supple.  Cardiovascular: Normal rate, regular rhythm and normal heart sounds.   Pulmonary/Chest: Effort normal and breath sounds normal. No respiratory distress. She has no wheezes.  Abdominal: Soft. Bowel sounds are normal. There is tenderness in the  epigastric area. There is no guarding and no CVA tenderness.  Abdomen soft, non-distended, focal tenderness noted in epigastrium with voluntary guarding  Musculoskeletal: Normal range of motion. She exhibits no edema.  Neurological: She is alert and oriented to person, place, and time.  Skin: Skin is warm and dry. She is not diaphoretic.  Psychiatric: She has a normal mood and affect.  Nursing note and vitals reviewed.   ED Course  Procedures (including critical care time) Labs Review Labs Reviewed - No data to display  Imaging Review No results found.   EKG Interpretation None      MDM   Final diagnoses:  Alcohol-induced chronic pancreatitis   34 year old female with history of alcoholism and chronic pancreatitis, here for the same. This is her third ED visit this week for similar complaints.  She was seen yesterday and noted to have lipase of 177 and ethanol of 202 even though she denies alcohol use in the past 48 hours to me.  She is afebrile and nontoxic. She has focal tenderness in her epigastrium with voluntary guarding. Labwork as above, lipase now significantly elevated at 933. Her ethanol is negative today. Patient was given IV fluids and has required multiple doses of narcotic pain medication without relief. Given her significant lab changes and persistent pain, feel that admission is warranted.  Case discussed with family medicine teaching service, will admit for further management.   Garlon HatchetLisa M Keysean Savino, PA-C 12/20/14 1600  Pricilla LovelessScott Goldston, MD 12/20/14 [phone]

## 2014-12-21 DIAGNOSIS — Z79891 Long term (current) use of opiate analgesic: Secondary | ICD-10-CM | POA: Diagnosis not present

## 2014-12-21 DIAGNOSIS — A599 Trichomoniasis, unspecified: Secondary | ICD-10-CM | POA: Diagnosis present

## 2014-12-21 DIAGNOSIS — E876 Hypokalemia: Secondary | ICD-10-CM | POA: Diagnosis present

## 2014-12-21 DIAGNOSIS — A5403 Gonococcal cervicitis, unspecified: Secondary | ICD-10-CM | POA: Diagnosis present

## 2014-12-21 DIAGNOSIS — K859 Acute pancreatitis, unspecified: Secondary | ICD-10-CM | POA: Diagnosis present

## 2014-12-21 DIAGNOSIS — K860 Alcohol-induced chronic pancreatitis: Secondary | ICD-10-CM | POA: Diagnosis not present

## 2014-12-21 DIAGNOSIS — D509 Iron deficiency anemia, unspecified: Secondary | ICD-10-CM | POA: Diagnosis present

## 2014-12-21 DIAGNOSIS — D649 Anemia, unspecified: Secondary | ICD-10-CM | POA: Diagnosis present

## 2014-12-21 DIAGNOSIS — Z720 Tobacco use: Secondary | ICD-10-CM | POA: Insufficient documentation

## 2014-12-21 DIAGNOSIS — F17210 Nicotine dependence, cigarettes, uncomplicated: Secondary | ICD-10-CM | POA: Diagnosis present

## 2014-12-21 LAB — COMPREHENSIVE METABOLIC PANEL
ALT: 8 U/L — ABNORMAL LOW (ref 14–54)
AST: 15 U/L (ref 15–41)
Albumin: 2.9 g/dL — ABNORMAL LOW (ref 3.5–5.0)
Alkaline Phosphatase: 49 U/L (ref 38–126)
Anion gap: 7 (ref 5–15)
CALCIUM: 7.7 mg/dL — AB (ref 8.9–10.3)
CHLORIDE: 107 mmol/L (ref 101–111)
CO2: 19 mmol/L — ABNORMAL LOW (ref 22–32)
CREATININE: 0.51 mg/dL (ref 0.44–1.00)
GFR calc Af Amer: >60 mL/min (ref >60)
GFR calc non Af Amer: >60 mL/min (ref >60)
Glucose, Bld: 78 mg/dL (ref 65–99)
Potassium: 3.5 mmol/L (ref 3.5–5.1)
Sodium: 133 mmol/L — ABNORMAL LOW (ref 135–145)
Total Bilirubin: 1 mg/dL (ref 0.3–1.2)
Total Protein: 5.3 g/dL — ABNORMAL LOW (ref 6.5–8.1)

## 2014-12-21 MED ORDER — CEFTRIAXONE SODIUM 500 MG IJ SOLR
250.0000 mg | Freq: Once | INTRAMUSCULAR | Status: AC
  Administered 2014-12-21: 250 mg via INTRAVENOUS
  Filled 2014-12-21: qty 250

## 2014-12-21 MED ORDER — ONDANSETRON HCL 4 MG/2ML IJ SOLN
4.0000 mg | Freq: Once | INTRAMUSCULAR | Status: AC
  Administered 2014-12-21: 4 mg via INTRAVENOUS
  Filled 2014-12-21: qty 2

## 2014-12-21 MED ORDER — HYDROMORPHONE HCL 1 MG/ML IJ SOLN
0.5000 mg | Freq: Once | INTRAMUSCULAR | Status: AC
  Administered 2014-12-21: 0.5 mg via INTRAVENOUS
  Filled 2014-12-21: qty 1

## 2014-12-21 MED ORDER — ONDANSETRON HCL 4 MG PO TABS
8.0000 mg | ORAL_TABLET | Freq: Four times a day (QID) | ORAL | Status: DC | PRN
  Administered 2014-12-21 (×2): 8 mg via ORAL
  Filled 2014-12-21 (×2): qty 2

## 2014-12-21 MED ORDER — DEXTROSE 5 % IV SOLN: 500.0000 mg | Freq: Once | INTRAVENOUS | Status: DC

## 2014-12-21 MED ORDER — DEXTROSE 5 % IV SOLN
Freq: Once | INTRAVENOUS | Status: AC
  Administered 2014-12-21: 13:00:00 via INTRAVENOUS
  Filled 2014-12-21: qty 500

## 2014-12-21 MED ORDER — SODIUM CHLORIDE 0.9 % IV SOLN
8.0000 mg | Freq: Four times a day (QID) | INTRAVENOUS | Status: DC | PRN
  Filled 2014-12-21 (×2): qty 4

## 2014-12-21 MED ORDER — METRONIDAZOLE 500 MG PO TABS
2000.0000 mg | ORAL_TABLET | Freq: Once | ORAL | Status: AC
  Administered 2014-12-21: 2000 mg via ORAL
  Filled 2014-12-21: qty 4

## 2014-12-21 NOTE — Clinical Social Work Note (Signed)
CSW received consult for patient to discuss her alcohol use.  CSW spoke with patient and she stated she does not feel like she has a problem drinking.  CSW talked to her about if she feels like she has a drinking problem she can look at Alcoholics Anonymous.  CSW gave patient schedule of local Alcoholics Anonymous meetings in case she does decide to explore the option of trying to get help.  CSW to sign off, please reconsult if social work needs arise.  Ervin KnackEric R. Tiaria Biby, MSW, Theresia MajorsLCSWA [phone] 12/21/2014 12:33 PM

## 2014-12-21 NOTE — Discharge Summary (Signed)
Family Medicine Teaching [facility] Discharge Summary  Patient name: Kristina Moyer Medical record number: [id number] Date of birth: 31-Jan-1981 Age: 34 y.o. Gender: female Date of Admission: 12/20/2014  Date of Discharge: 12/29/2014  Admitting Physician: Tobey Grim, MD  Primary Care Provider: No PCP Per Patient Consultants: None  Indication for Hospitalization: Acute on chronic pancreatitis   Discharge Diagnoses/Problem List:  Acute on chronic pancreatitis Gonorrhea cervicitis Trichomoniasis alcohol abuse tobacco abuse  Disposition: Home  Discharge Condition: Improved  Discharge Exam:  Filed Vitals:   12/29/14 0512  BP: 120/80  Pulse: 67  Temp: 98.3 F (36.8 C)  Resp: 18   General: resting in bed, NAD Cardiovascular: RRR, no murmurs or rubs appreciated Respiratory: NWOB, CTAB with no crackles/wheezes Abdomen: +BS, NTND, soft. No rebound or guarding Extremities: WWP, no cyanosis or edema Neuro: follows commands, no focal deficits.   Brief Hospital Course:  [name] is a 34 year old female with PMH significant for chronic pancreatitis, alcohol abuse, and tobacco abuse who presented with 4 days of severe abdominal pain and found to have a lipase of 911 on admission in the setting of recently drinking at least 6 alcoholic beverages 2 days prior to admission. Patient was admitted for symptomatic control of acute pancreatitis. Abdominal ultrasound was consistent with acute pancreatitis. The patient was made NPO and her diet was slowly advanced. On 6/2, patient's pain was concerning as it was not improving, so CT abd/pelvis was obtained, which showed uncomplicated pancreatitis. Her symptoms gradually improved and she was able to have her pain adequately controlled by oral pain medications at the time of discharge. She had no further complications during her stay and was discharged home in stable condition.   Patient was also noted to have positive gonococcal swab and  trichomoniasis on wet prep from an urgent care visit 1 week prior to admission. Patient was treated with ceftriaxone, azithromycin, and flagyl. We checked HIV and RPR, which were nonreactive. Patient was instructed to inform all sexual partners within the past 60 days of diagnosis.   Issues for Follow Up:  1. Encourage alcohol and tobacco cessation.  2. F/u pain control and tolerating diet  Significant Procedures: none  Significant Labs and Imaging:   Recent Labs Lab 12/26/14 0555 12/27/14 0352 12/28/14 0531  WBC 3.8* 4.7 4.7  HGB 8.1* 7.9* 8.1*  HCT 24.1* 23.6* 24.6*  PLT 330 347 385    Recent Labs Lab 12/25/14 0428 12/26/14 0555 12/27/14 0352 12/28/14 0531 12/29/14 0516  NA 137 139 137 138 134*  K 3.4* 3.3* 3.5 3.3* 3.3*  CL 109 108 105 103 101  CO2 GLUCOSE 104* 94 94 92 103*  BUN <5* <5* <5* <5* <5*  CREATININE 0.44 0.47 0.46 0.49 0.47  CALCIUM 8.4* 8.5* 8.5* 8.7* 8.9   Lipase 933 ALT 11 AST 21 Albumin 3.6 Ca ionized 1.21 ETOH <5 UPreg neg  US Abdomen Complete (12/20/2014):    IMPRESSION: Acute pancreatitis with mild peripancreatic fluid.  Otherwise negative abdominal ultrasound.    Ct Abdomen Pelvis W Contrast (12/23/2014):   IMPRESSION: 1. Enlargement of the pancreatic body and tail with surrounding inflammation consistent with uncomplicated pancreatitis. 2. Ascites and bilateral pleural effusions. 3. Left gonadal vein varices noted incidentally. 4. Probable incidental hemangioma within the dome of the left hepatic lobe.      Results/Tests Pending at Time of Discharge: none  Discharge Medications:    Medication List    STOP taking  these medications        oxyCODONE-acetaminophen 5-325 MG per tablet  Commonly known as:  PERCOCET/ROXICET      TAKE these medications        HYDROcodone-acetaminophen 5-325 MG per tablet  Commonly known as:  NORCO/VICODIN  Take 1 tablet by mouth every 4 (four) hours as needed for moderate pain or severe  pain.     ibuprofen 200 MG tablet  Commonly known as:  ADVIL,MOTRIN  Take 200 mg by mouth every 6 (six) hours as needed for moderate pain.     omeprazole 20 MG capsule  Commonly known as:  PRILOSEC  Take 1 capsule (20 mg total) by mouth daily.     ondansetron 4 MG disintegrating tablet  Commonly known as:  ZOFRAN ODT  Take 1 tablet (4 mg total) by mouth every 8 (eight) hours as needed for nausea.     ondansetron 4 MG tablet  Commonly known as:  ZOFRAN  Take 1 tablet (4 mg total) by mouth every 6 (six) hours.        Discharge Instructions: Please refer to Patient Instructions section of EMR for full details.  Patient was counseled important signs and symptoms that should prompt return to medical care, changes in medications, dietary instructions, activity restrictions, and follow up appointments.   Follow-Up Appointments: Follow-up Information    Follow up with [facility]    .   Why:  call for appointment    Contact information:   [address] [place] [id number] [phone]      Follow up with  [facility]    .   Why:  call for an appointment    Contact information:   [address] [place] [phone]      Kristina DownerAngela M Bacigalupo, MD 12/29/2014, 2:38 PM PGY-1, [facility]

## 2014-12-21 NOTE — Discharge Instructions (Signed)
Acute Pancreatitis °Acute pancreatitis is a disease in which the pancreas becomes suddenly irritated (inflamed). The pancreas is a large gland behind your stomach. The pancreas makes enzymes that help digest food. The pancreas also makes 2 hormones that help control your blood sugar. Acute pancreatitis happens when the enzymes attack and damage the pancreas. Most attacks last a couple of days and can cause serious problems. °HOME CARE °· Follow your doctor's diet instructions. You may need to avoid alcohol and limit fat in your diet. °· Eat small meals often. °· Drink enough fluids to keep your pee (urine) clear or pale yellow. °· Only take medicines as told by your doctor. °· Avoid drinking alcohol if it caused your disease. °· Do not smoke. °· Get plenty of rest. °· Check your blood sugar at home as told by your doctor. °· Keep all doctor visits as told. °GET HELP IF: °· You do not get better as quickly as expected. °· You have new or worsening symptoms. °· You have lasting pain, weakness, or feel sick to your stomach (nauseous). °· You get better and then have another pain attack. °GET HELP RIGHT AWAY IF:  °· You are unable to eat or keep fluids down. °· Your pain becomes severe. °· You have a fever or lasting symptoms for more than 2 to 3 days. °· You have a fever and your symptoms suddenly get worse. °· Your skin or the white part of your eyes turn yellow (jaundice). °· You throw up (vomit). °· You feel dizzy, or you pass out (faint). °· Your blood sugar is high (over 300 mg/dL). °MAKE SURE YOU:  °· Understand these instructions. °· Will watch your condition. °· Will get help right away if you are not doing well or get worse. °Document Released: 12/26/2007 Document Revised: 11/23/2013 Document Reviewed: 10/18/2011 °ExitCare® Patient Information ©2015 [employer]. This information is not intended to replace advice given to you by your health care provider. Make sure you discuss any questions you have with your  health care provider. ° °

## 2014-12-21 NOTE — Progress Notes (Signed)
Family Medicine Teaching Service Daily Progress Note Intern Pager: [phone]  Patient name: Kristina Moyer Medical record number: [id number] Date of birth: 24-Jan-1981 Age: 34 y.o. Gender: female  Primary Care Provider: No PCP Per Patient Consultants: None Code Status: Full  Pt Overview and Major Events to Date:  5/30 - Admitted with acute on chronic pancreatitis   Assessment and Plan: Kristina Moyer is a 34 y.o. female presenting with abdominal pain . PMH is significant for ETOH abuse, chronic pancreatitis, Iron deficiency anemia  Abdominal pain/Acute on chronic pancreatitis: Lipase 933 on admission. Epigastric pain with some guarding only in epigastrium.Abdominal US consistent with acute pancreatitis with mild peripancreatic fluid. Hemodynamically stable. -NPO, advance diet as tolerated -Norco 5mg  1-2tabs q4 PRN, Dilaudid 0.5mg  IV q4 PRN severe pain -Zofran PRN nausea -Low threshold for CT abd if pain worsens -Serial abdominal exams  Gonorrhea / Trichomoniasis. Noted on STD screening at urgent the week prior to admission. Has not been treated. Low suspicion for PID given lack of fevers and normal WBC.  - Will give 250mg  ceftriaxone - Will give 1g azithromycin - Will give 2g metronidazole - Will check HIV and RPR  ETOH abuse: Drinks 2 margarita drinks a day on average 5x/week. Drank 6 alcoholic beverages this past weekend.  -CIWA protocol -c/s Social Work  -continued alcohol cessation counseling, esp in light of chronic pancreatitis  Tobacco dependence: Smokes 1ppd -Nicotine patch PRN -Continue smoking cessation counseling, esp in light of chronic pancreatitis  FEN/GI: NS @150cc /h, NPO  Prophylaxis: Sub-q heparin  Disposition: Admitted pending improvement in symptoms  Subjective:  Reports that abdominal pain is about the same this morning. Also with several bouts of emesis overnight. No shortness of breath or chest pain.   Objective: Temp:  [97.8 F (36.6 C)-98.8  F (37.1 C)] 98.7 F (37.1 C) (05/31 0541) Pulse Rate:  [44-77] 77 (05/31 0541) Resp:  [16-18] 16 (05/31 0541) BP: (106-153)/(54-99) 121/73 mmHg (05/31 0541) SpO2:  [98 %-100 %] 99 % (05/31 0541) Weight:  [127 lb (57.607 kg)] 127 lb (57.607 kg) (05/30 1621) Physical Exam: General: 34 year old female appearing in pain lying in hospital bed Cardiovascular: RRR, no murmurs or rubs appreciated Respiratory: NWOB, CTAB with no crackles Abdomen: +BS, distended, diffusely tender, most significantly in epigastrum. No rebound or guarding Extremities: WWP, no cyanosis or edema Neuro: Alert and conversational, no focal deficits.   Laboratory/Imaging/Diagnostic Testing:  Recent Labs Lab 12/19/14 0730 12/20/14 1034 12/20/14 1446  WBC 4.8 7.8  --   HGB 9.7* 9.3* 10.9*  HCT 30.2* 28.8* 32.0*  PLT 337 305  --     Recent Labs Lab 12/19/14 0730 12/20/14 1034 12/20/14 1446 12/21/14 0548  NA 138 138 136 133*  K 3.7 3.4* 3.8 3.5  CL 107 107 106 107  CO2 20* 19*  --  19*  BUN 9 <5* 4* <5*  CREATININE 0.73 0.62 0.50 0.51  CALCIUM 8.3* 8.8*  --  7.7*  PROT 6.6 6.1*  --  5.3*  BILITOT 0.3 0.9  --  1.0  ALKPHOS 77 58  --  49  ALT 12* 11*  --  8*  AST 23 21  --  15  GLUCOSE 96 116* 120* 78     Ardith Darkaleb M Deni Berti, MD 12/21/2014, 7:35 AM PGY-1, [facility] FPTS Intern pager: [phone], text pages welcome

## 2014-12-21 NOTE — Care Management Note (Signed)
Case Management Note  Patient Details  Name: Kristina Moyer MRN: [id number] Date of Birth: 1980-11-22  Subjective/Objective:                    Action/Plan:  Will provide MATCH letter and community Health and Wellness appointment on day of discharge .  Expected Discharge Date:        12-23-14          Expected Discharge Plan:  Home/Self Care  In-House Referral:     Discharge planning Services  CM Consult, [facility], [facility], Medication Assistance  Post Acute Care Choice:    Choice offered to:     DME Arranged:    DME Agency:     HH Arranged:    HH Agency:     Status of Service:  In process, will continue to follow  Medicare Important Message Given:    Date Medicare IM Given:    Medicare IM give by:    Date Additional Medicare IM Given:    Additional Medicare Important Message give by:     If discussed at Long Length of Stay Meetings, dates discussed:    Additional Comments:  Kingsley PlanWile, [name] [name], RN 12/21/2014, 2:51 PM

## 2014-12-22 DIAGNOSIS — R509 Fever, unspecified: Secondary | ICD-10-CM

## 2014-12-22 LAB — CBC
HCT: 26.4 % — ABNORMAL LOW (ref 36.0–46.0)
HEMOGLOBIN: 8.6 g/dL — AB (ref 12.0–15.0)
MCH: 26.5 pg (ref 26.0–34.0)
MCHC: 32.6 g/dL (ref 30.0–36.0)
MCV: 81.2 fL (ref 78.0–100.0)
Platelets: 286 10*3/uL (ref 150–400)
RBC: 3.25 MIL/uL — ABNORMAL LOW (ref 3.87–5.11)
RDW: 18.9 % — ABNORMAL HIGH (ref 11.5–15.5)
WBC: 6.8 10*3/uL (ref 4.0–10.5)

## 2014-12-22 LAB — COMPREHENSIVE METABOLIC PANEL
ALBUMIN: 2.6 g/dL — AB (ref 3.5–5.0)
ALT: 9 U/L — ABNORMAL LOW (ref 14–54)
ANION GAP: 10 (ref 5–15)
AST: 17 U/L (ref 15–41)
Alkaline Phosphatase: 43 U/L (ref 38–126)
BUN: <5 mg/dL — ABNORMAL LOW (ref 6–20)
CHLORIDE: 103 mmol/L (ref 101–111)
CO2: 18 mmol/L — ABNORMAL LOW (ref 22–32)
Calcium: 7.8 mg/dL — ABNORMAL LOW (ref 8.9–10.3)
Creatinine, Ser: 0.56 mg/dL (ref 0.44–1.00)
GFR calc Af Amer: >60 mL/min (ref >60)
GFR calc non Af Amer: >60 mL/min (ref >60)
GLUCOSE: 72 mg/dL (ref 65–99)
Potassium: 3.3 mmol/L — ABNORMAL LOW (ref 3.5–5.1)
SODIUM: 131 mmol/L — AB (ref 135–145)
Total Bilirubin: 0.8 mg/dL (ref 0.3–1.2)
Total Protein: 5 g/dL — ABNORMAL LOW (ref 6.5–8.1)

## 2014-12-22 LAB — URINALYSIS, ROUTINE W REFLEX MICROSCOPIC
Bilirubin Urine: NEGATIVE
Glucose, UA: NEGATIVE mg/dL
HGB URINE DIPSTICK: NEGATIVE
Ketones, ur: >80 mg/dL — AB
LEUKOCYTES UA: NEGATIVE
NITRITE: NEGATIVE
PROTEIN: NEGATIVE mg/dL
Specific Gravity, Urine: 1.008 (ref 1.005–1.030)
UROBILINOGEN UA: 0.2 mg/dL (ref 0.0–1.0)
pH: 6 (ref 5.0–8.0)

## 2014-12-22 LAB — HIV ANTIBODY (ROUTINE TESTING W REFLEX): HIV Screen 4th Generation wRfx: NONREACTIVE

## 2014-12-22 LAB — RPR: RPR Ser Ql: NONREACTIVE

## 2014-12-22 MED ORDER — POTASSIUM CHLORIDE CRYS ER 20 MEQ PO TBCR
40.0000 meq | EXTENDED_RELEASE_TABLET | Freq: Once | ORAL | Status: AC
  Administered 2014-12-22: 40 meq via ORAL
  Filled 2014-12-22: qty 2

## 2014-12-22 NOTE — Progress Notes (Signed)
Family Medicine Teaching Service Daily Progress Note Intern Pager: [phone]  Patient name: Kristina Moyer Medical record number: [id number] Date of birth: Nov 24, 1980 Age: 34 y.o. Gender: female  Primary Care Provider: No PCP Per Patient Consultants: None Code Status: Full  Pt Overview and Major Events to Date:  5/30 - Admitted with acute on chronic pancreatitis   Assessment and Plan: Kristina Moyer is a 34 y.o. female presenting with abdominal pain . PMH is significant for ETOH abuse, chronic pancreatitis, Iron deficiency anemia  Abdominal pain/Acute on chronic pancreatitis: Lipase 933 on admission. Epigastric pain with some guarding only in epigastrium.Abdominal US consistent with acute pancreatitis with mild peripancreatic fluid. Hemodynamically stable. -NPO, advance diet as tolerated -Norco  1-2tabs q4 PRN (4 tablets in last 24h), Dilaudid 0.5mg  IV q4 PRN severe pain (  in last 24h) -Zofran PRN nausea -Low threshold for CT abd if pain worsens -Serial abdominal exams  Gonorrhea / Trichomoniasis. Noted on STD screening at [facility] the week prior to admission. Has not been treated. Low suspicion for PID given lack of fevers and normal WBC.  - s/p  ceftriaxone, 1g azithromycin, 2g metronidazole - HIV and RPR nonreactive  Fever: Tmax 100.5 5/31 at 2100.  Possibly related to ongoing inflammation from pancreatitis vs infection (PID vs intraabdominal process vs UTI vs PNA) - Trend fever curve - BCx pending - Consider CXR if develops respiratory symptoms - abd pain stable, consider further imaging if changes or increases - UA today given suprapubic tenderness and pain with urination  Normocytic anemia: Hgb downtrending 10.9 (5/20) to 8.6 (6/1) - has been in 9.3-9.8 range early during admission - Continue trend hemoglobin  ETOH abuse: Drinks 2 margarita drinks a day on average 5x/week. Drank 6 alcoholic beverages this past weekend.  -CIWA protocol - has not required ativan -c/s  Social Work  -continued alcohol cessation counseling, esp in light of chronic pancreatitis  Tobacco dependence: Smokes 1ppd -Nicotine patch PRN -Continue smoking cessation counseling, esp in light of chronic pancreatitis  FEN/GI: NS /h, NPO  Prophylaxis: Sub-q heparin  Disposition: Admitted pending improvement in symptoms  Subjective:  Abdominal pain is slightly better, tolerating intermittent ice chips. Denies any respiratory symptoms, bleeding. Reports suprapubic pain while urinating.  Objective: Temp:  [98.4 F (36.9 C)-100.5 F (38.1 C)] 99.4 F (37.4 C) (06/01 0545) Pulse Rate:  [72-93] 78 (06/01 0545) Resp:  [16-18] 17 (06/01 0545) BP: (110-127)/(55-69) 110/55 mmHg (06/01 0545) SpO2:  [100 %] 100 % (06/01 0545) Physical Exam: General: appearing in pain lying in hospital bed Cardiovascular: RRR, no murmurs or rubs appreciated Respiratory: NWOB, CTAB with no crackles/wheezes Abdomen: +BS, mildly distended, TTP over epigastrium, LUQ, LLQ, and suprapubic - most significantly in epigastrum. No rebound or guarding Extremities: WWP, no cyanosis or edema Neuro: Alert and conversational, no focal deficits.   Laboratory/Imaging/Diagnostic Testing:  Recent Labs Lab 12/19/14 0730 12/20/14 1034 12/20/14 1446 12/22/14 0514  WBC 4.8 7.8  --  6.8  HGB 9.7* 9.3* 10.9* 8.6*  HCT 30.2* 28.8* 32.0* 26.4*  PLT 337 305  --  286    Recent Labs Lab 12/20/14 1034 12/20/14 1446 12/21/14 0548 12/22/14 0514  NA 138 136 133* 131*  K 3.4* 3.8 3.5 3.3*  CL 107 106 107 103  CO2 19*  --  19* 18*  BUN <5* 4* <5* <5*  CREATININE 0.62 0.50 0.51 0.56  CALCIUM 8.8*  --  7.7* 7.8*  PROT 6.1*  --  5.3* 5.0*  BILITOT 0.9  --  1.0 0.8  ALKPHOS 58  --  49 43  ALT 11*  --  8* 9*  AST 21  --  15 17  GLUCOSE 116* 120* 78 72     Erasmo DownerAngela M Jamera Vanloan, MD 12/22/2014, 9:43 AM PGY-1, [facility] FPTS Intern pager: [phone], text pages welcome

## 2014-12-22 NOTE — Progress Notes (Signed)
Interim Progress Note:   S: Went to evaluate patient due to fevers.  He continues to complain of abdominal pain, but reports this is unchanged in severity since admission.    O:  BP 121/66 mmHg  Pulse 93  Temp(Src) 100.5 F (38.1 C) (Oral)  Resp 16  Ht 5\' 10"  (1.778 m)  Wt 127 lb (57.607 kg)  BMI 18.22 kg/m2  SpO2 100%  LMP 12/03/2014 General: Asleep upon entering; NAD Heart: S1, S2 normal, no murmur, rub or gallop, regular rate and rhythm Abdomen: Epigastric and LUQ tenderness to palpation.  No guarding or tension.  Tenderness to light percussion.   A/P Pancreatitis with new fevers: Possibly due to ongoing inflammatory pancreatitis versus infection - BCx: ordered - We'll defer additional abdominal and imaging as abdominal pain and exam unchanged - Consider chest x-ray if develops respiratory symptoms   Alvin CritchleyJoyner, Oree Mirelez,  [facility] PGY-2 Resident

## 2014-12-23 ENCOUNTER — Inpatient Hospital Stay (HOSPITAL_COMMUNITY): Payer: Medicaid Other

## 2014-12-23 LAB — C-REACTIVE PROTEIN: CRP: 11.4 mg/dL — ABNORMAL HIGH (ref <1.0)

## 2014-12-23 LAB — URINE CULTURE
Colony Count: NO GROWTH
Culture: NO GROWTH

## 2014-12-23 MED ORDER — HYDROCODONE-ACETAMINOPHEN 5-325 MG PO TABS
1.0000 | ORAL_TABLET | ORAL | Status: DC
  Administered 2014-12-23 – 2014-12-27 (×23): 1 via ORAL
  Filled 2014-12-23 (×24): qty 1

## 2014-12-23 MED ORDER — IOHEXOL 300 MG/ML  SOLN
80.0000 mL | Freq: Once | INTRAMUSCULAR | Status: AC | PRN
  Administered 2014-12-23: 80 mL via INTRAVENOUS

## 2014-12-23 MED ORDER — IOHEXOL 300 MG/ML  SOLN
25.0000 mL | INTRAMUSCULAR | Status: AC
  Administered 2014-12-23 (×2): 25 mL via ORAL

## 2014-12-23 NOTE — Progress Notes (Signed)
Family Medicine Teaching Service Daily Progress Note Intern Pager: [phone]  Patient name: Kristina ShaggyLatoya S Blower Medical record number: [id number] Date of birth: Apr 05, 1981 Age: 34 y.o. Gender: female  Primary Care Provider: No PCP Per Patient Consultants: None Code Status: Full  Pt Overview and Major Events to Date:  5/30 - Admitted with acute on chronic pancreatitis   Assessment and Plan:  Kristina Moyer is a 34 y.o. female presenting with abdominal pain . PMH is significant for ETOH abuse, chronic pancreatitis, Iron deficiency anemia  Abdominal pain 2/2 Acute on chronic pancreatitis: Lipase 933 on admission. Epigastric pain with some guarding only in epigastrium.Abdominal US consistent with acute pancreatitis with mild peripancreatic fluid. Hemodynamically stable. - Advanced to clear liquid diet O/N - Schedule Norco 5mg  q4  - Continue Dilaudid 0.5mg  IV q4 PRN severe pain (2mg  in last 24h) - Zofran PRN nausea - CT abd/pelvis today to evaluate for complicated pancreatitis or pseudocyst - CRP today - Serial abdominal exams  Gonorrhea/Trichomoniasis. [facility]. Has not been treated. Low suspicion for PID given lack of fevers and normal WBC.  - s/p 250mg  ceftriaxone, 1g azithromycin, 2g metronidazole - HIV and RPR nonreactive  Fever: Resolved. Tmax 100.5 5/31 at 2100, afebrile since.  Possibly related to ongoing inflammation from pancreatitis vs infection (PID vs intraabdominal process). UA noninfectious. - Trend fever curve - BCx pending - Consider CXR if develops respiratory symptoms - abd pain stable, consider further imaging if changes or increases  Normocytic anemia: Hgb downtrending 10.9 (5/20) to 8.6 (6/1) - has been in 9.3-9.8 range early during admission - Continue trend hemoglobin  ETOH abuse: Drinks 2 margarita drinks a day on average 5x/week. Drank 6 alcoholic beverages this past weekend.  -CIWA protocol - has not required  ativan -c/s Social Work  -continued alcohol cessation counseling, esp in light of chronic pancreatitis  Tobacco dependence: Smokes 1ppd -Nicotine patch PRN -Continue smoking cessation counseling, esp in light of chronic pancreatitis  FEN/GI: NS @150cc /h, Clear liq diet  Prophylaxis: Sub-q heparin  Disposition: Admitted pending improvement in symptoms  Subjective:  Abdominal pain is the same, worse after drinking juice.  Denies N/V/D.   Objective: Temp:  [98.2 F (36.8 C)-98.8 F (37.1 C)] 98.7 F (37.1 C) (06/02 0517) Pulse Rate:  [72-84] 75 (06/02 0517) Resp:  [16-17] 16 (06/02 0517) BP: (112-122)/(69-78) 122/76 mmHg (06/02 0517) SpO2:  [100 %] 100 % (06/02 0517) Physical Exam: General: appearing in pain lying in hospital bed Cardiovascular: RRR, no murmurs or rubs appreciated Respiratory: NWOB, CTAB with no crackles/wheezes Abdomen: +BS, mildly distended, TTP over epigastrium, LUQ, LLQ, and suprapubic - most significantly in epigastrum. No rebound or guarding Extremities: WWP, no cyanosis or edema Neuro: Alert and conversational, no focal deficits.   Laboratory/Imaging/Diagnostic Testing:  Recent Labs Lab 12/19/14 0730 12/20/14 1034 12/20/14 1446 12/22/14 0514  WBC 4.8 7.8  --  6.8  HGB 9.7* 9.3* 10.9* 8.6*  HCT 30.2* 28.8* 32.0* 26.4*  PLT 337 305  --  286    Recent Labs Lab 12/20/14 1034 12/20/14 1446 12/21/14 0548 12/22/14 0514  NA 138 136 133* 131*  K 3.4* 3.8 3.5 3.3*  CL 107 106 107 103  CO2 19*  --  19* 18*  BUN <5* 4* <5* <5*  CREATININE 0.62 0.50 0.51 0.56  CALCIUM 8.8*  --  7.7* 7.8*  PROT 6.1*  --  5.3* 5.0*  BILITOT 0.9  --  1.0 0.8  ALKPHOS  58  --  49 43  ALT 11*  --  8* 9*  AST 21  --  15 17  GLUCOSE 116* 120* 78 72    Urinalysis    Component Value Date/Time   COLORURINE YELLOW 12/22/2014 1100   APPEARANCEUR CLEAR 12/22/2014 1100   LABSPEC 1.008 12/22/2014 1100   PHURINE 6.0 12/22/2014 1100   GLUCOSEU NEGATIVE 12/22/2014  1100   HGBUR NEGATIVE 12/22/2014 1100   BILIRUBINUR NEGATIVE 12/22/2014 1100   KETONESUR >80* 12/22/2014 1100   PROTEINUR NEGATIVE 12/22/2014 1100   UROBILINOGEN 0.2 12/22/2014 1100   NITRITE NEGATIVE 12/22/2014 1100   LEUKOCYTESUR NEGATIVE 12/22/2014 1100     Erasmo Downer, MD 12/23/2014, 8:35 AM PGY-1, [facility] FPTS Intern pager: [phone], text pages welcome

## 2014-12-23 NOTE — Progress Notes (Signed)
FPTS Interim Progress Note  S: Called RN for updates around 2300, who stated pt doing well received last Dilaudid 0.5mg  IV at 2214, otherwise has not complained about any pain since, and might be asleep. Went to check on patient around 0032, patient resting in bed using her phone and watching TV. She reports overall still feels about the same or a little better than this morning, but definitely not worse. Dilaudid pain medicine is helping pain, but only lasts for about 45 min to 1 hour, says difficult to get rest currently. Pain persistent LLQ and LUQ, same as before without worsening. States she is thirsty and really wants some cranberry juice, and feels hungry. - Denies any fevers/chills, CP, SOB, nausea / vomiting, diarrhea  O: BP 112/69 mmHg  Pulse 84  Temp(Src) 98.8 F (37.1 C) (Oral)  Resp 17  Ht 5\' 10"  (1.778 m)  Wt 127 lb (57.607 kg)  BMI 18.22 kg/m2  SpO2 100%  LMP 12/03/2014  Gen - resting in bed, playing on telephone, well-appearing, seems comfortable, NAD HEENT - MMM Heart - RRR, no murmurs heard Lungs - CTAB, non-labored Abd - soft, non-distended, stable/consistent +TTP LLQ, LUQ, epigastric, no rebound or guarding, no masses, +active BS Ext - non-tender, no edema, peripheral pulses intact +2 b/l Skin - warm, dry Neuro - awake, alert, oriented  A/P: Briefly, Kristina Moyer is a [age] yr F admitted on 5/30 for abdominal pain consistent with acute pancreatitis secondary to acute on chronic alcohol abuse. Abd US consistent with acute pancreatitis. Elevated Lipase 933.  # Abdominal pain / Acute pancreatitis Seems stable without worsening, vitals stable, afebrile, BP stable. Still requesting Dilaudid doses PRN with improvement, but not lasting entire duration. No new complications. Abdominal exam stable without acute abd. - Advance diet from NPO to clear liquids overnight (agreed to have small amt cranberry juice, then mostly water / ice chips overnight, then can order more liquids  in AM) - Continue current pain control with Dilaudid 0.5mg  IV PRN, Norco PO PRN - Continue IVF rehydration - No signs of SIRS or clinical worsening, seems mildly improved, otherwise would check Lactate if worsening - As team discussed earlier, agree with Abd CT tomorrow if worsening or no improvement. Do not see any urgent indication to ordering this overnight.  Smitty CordsAlexander J Karamalegos, DO 12/23/2014, 12:53 AM PGY-2, [facility] Service pager [phone]

## 2014-12-23 NOTE — Progress Notes (Signed)
Pt was referred by nurse. Chaplain spoke with Pt regarding her emotional or spiritual needs. Pt talked about her family. Chaplain will follow-up.

## 2014-12-24 LAB — BASIC METABOLIC PANEL
Anion gap: 6 (ref 5–15)
BUN: <5 mg/dL — ABNORMAL LOW (ref 6–20)
CHLORIDE: 110 mmol/L (ref 101–111)
CO2: 20 mmol/L — ABNORMAL LOW (ref 22–32)
CREATININE: 0.4 mg/dL — AB (ref 0.44–1.00)
Calcium: 8 mg/dL — ABNORMAL LOW (ref 8.9–10.3)
GFR calc Af Amer: >60 mL/min (ref >60)
GFR calc non Af Amer: >60 mL/min (ref >60)
Glucose, Bld: 102 mg/dL — ABNORMAL HIGH (ref 65–99)
Potassium: 3.2 mmol/L — ABNORMAL LOW (ref 3.5–5.1)
SODIUM: 136 mmol/L (ref 135–145)

## 2014-12-24 LAB — CBC
HEMATOCRIT: 23.8 % — AB (ref 36.0–46.0)
HEMOGLOBIN: 7.8 g/dL — AB (ref 12.0–15.0)
MCH: 26.2 pg (ref 26.0–34.0)
MCHC: 32.8 g/dL (ref 30.0–36.0)
MCV: 79.9 fL (ref 78.0–100.0)
PLATELETS: 289 10*3/uL (ref 150–400)
RBC: 2.98 MIL/uL — AB (ref 3.87–5.11)
RDW: 19.2 % — ABNORMAL HIGH (ref 11.5–15.5)
WBC: 5.2 10*3/uL (ref 4.0–10.5)

## 2014-12-24 MED ORDER — POTASSIUM CHLORIDE CRYS ER 20 MEQ PO TBCR
40.0000 meq | EXTENDED_RELEASE_TABLET | Freq: Once | ORAL | Status: AC
  Administered 2014-12-24: 40 meq via ORAL
  Filled 2014-12-24: qty 2

## 2014-12-24 NOTE — Progress Notes (Signed)
Pt has been encouraged today to come into the hallway and ambulate PRN. She has refused. Pt has slept most of the day with no complaints.

## 2014-12-24 NOTE — Progress Notes (Signed)
**  Interval Note**  Patient resting in bed, well appearing, NAD.  She reports that abdominal pain is improving.  She has been able to tolerate some clear fluids this evening without difficulty.  She voices no concerns at this time.  Abdomen soft, with mild epigastric TTP, +BS x4, no guarding or rebound.  Analyse Angst M. Nadine CountsGottschalk, DO PGY-1, [facility]

## 2014-12-24 NOTE — Progress Notes (Signed)
Family Medicine Teaching Service Daily Progress Note Intern Pager: [phone]  Patient name: Kristina Moyer Medical record number: [id number] Date of birth: 12-29-1980 Age: 34 y.o. Gender: female  Primary Care Provider: No PCP Per Patient Consultants: None Code Status: Full  Pt Overview and Major Events to Date:  5/30 - Admitted with acute on chronic pancreatitis   Assessment and Plan:  ERETRIA MANTERNACH is a 34 y.o. female presenting with abdominal pain . PMH is significant for ETOH abuse, chronic pancreatitis, Iron deficiency anemia  Abdominal pain 2/2 Acute on chronic pancreatitis: Lipase 933 on admission. Epigastric pain with some guarding only in epigastrium.Abdominal US consistent with acute pancreatitis with mild peripancreatic fluid. Hemodynamically stable. - Advanced to clear liquid diet  - Continue Scheduled Norco  q4  - Continue Dilaudid 0.5mg  IV q4 PRN severe pain (  in last 24h) - Zofran PRN nausea - CT abd/pelvis 6/2 shows uncomplicated pancreatitis - CRP 11.4 - Serial abdominal exams  Gonorrhea/Trichomoniasis. Noted on STD screening at [facility] the week prior to admission. Has not been treated. Low suspicion for PID given lack of fevers and normal WBC.  - s/p  ceftriaxone, 1g azithromycin, 2g metronidazole - HIV and RPR nonreactive  Fever: Resolved. Tmax 100.5 5/31 at 2100, afebrile since.  Possibly related to ongoing inflammation from pancreatitis vs infection (PID vs intraabdominal process). UA noninfectious, UCx no growth. - BCx pending  Normocytic anemia: Hgb downtrending 8.6 (6/1) > 7.8 - baseline 9.3-9.8 - Continue trend hemoglobin  ETOH abuse: Drinks 2 margarita drinks a day on average 5x/week. Drank 6 alcoholic beverages this past weekend.  -CIWA protocol - has not required ativan -c/s Social Work  -continued alcohol cessation counseling, esp in light of chronic pancreatitis  Tobacco dependence: Smokes 1ppd -Nicotine patch PRN -Continue smoking  cessation counseling, esp in light of chronic pancreatitis  FEN/GI: NS /h, Clear liq diet  Prophylaxis: Sub-q heparin  Disposition: Admitted pending improvement in symptoms  Subjective:  Abdominal pain is somewhat better after scheduling norco 6/2.  Still uncomfortable.  Objective: Temp:  [98.3 F (36.8 C)-98.7 F (37.1 C)] 98.5 F (36.9 C) (06/03 0459) Pulse Rate:  [65-70] 65 (06/03 0459) Resp:  [16-17] 17 (06/03 0459) BP: (116-132)/(74-76) 132/74 mmHg (06/03 0459) SpO2:  [100 %] 100 % (06/03 0459) Physical Exam: General: appearing in mild pain lying in hospital bed Cardiovascular: RRR, no murmurs or rubs appreciated Respiratory: NWOB, CTAB with no crackles/wheezes Abdomen: +BS, mildly distended, TTP over epigastrium, LUQ, LLQ, and suprapubic - most significantly in epigastrum. No rebound or guarding Extremities: WWP, no cyanosis or edema Neuro: Alert and conversational, no focal deficits.   Laboratory/Imaging/Diagnostic Testing:  Recent Labs Lab 12/20/14 1034 12/20/14 1446 12/22/14 0514 12/24/14 0604  WBC 7.8  --  6.8 5.2  HGB 9.3* 10.9* 8.6* 7.8*  HCT 28.8* 32.0* 26.4* 23.8*  PLT 305  --  286 289    Recent Labs Lab 12/20/14 1034  12/21/14 0548 12/22/14 0514 12/24/14 0604  NA 138  < > 133* 131* 136  K 3.4*  < > 3.5 3.3* 3.2*  CL 107  < > 107 103 110  CO2 19*  --  19* 18* 20*  BUN <5*  < > <5* <5* <5*  CREATININE 0.62  < > 0.51 0.56 0.40*  CALCIUM 8.8*  --  7.7* 7.8* 8.0*  PROT 6.1*  --  5.3* 5.0*  --   BILITOT 0.9  --  1.0 0.8  --   ALKPHOS 58  --  49  43  --   ALT 11*  --  8* 9*  --   AST 21  --  15 17  --   GLUCOSE 116*  < > 78 72 102*  < > = values in this interval not displayed.  Urinalysis    Component Value Date/Time   COLORURINE YELLOW 12/22/2014 1100   APPEARANCEUR CLEAR 12/22/2014 1100   LABSPEC 1.008 12/22/2014 1100   PHURINE 6.0 12/22/2014 1100   GLUCOSEU NEGATIVE 12/22/2014 1100   HGBUR NEGATIVE 12/22/2014 1100   BILIRUBINUR  NEGATIVE 12/22/2014 1100   KETONESUR >80* 12/22/2014 1100   PROTEINUR NEGATIVE 12/22/2014 1100   UROBILINOGEN 0.2 12/22/2014 1100   NITRITE NEGATIVE 12/22/2014 1100   LEUKOCYTESUR NEGATIVE 12/22/2014 1100    US Abdomen Complete  12/20/2014   CLINICAL DATA:  Pancreatitis, nausea/ vomiting, upper abdominal pain, evaluate for gallstones  EXAM: ULTRASOUND ABDOMEN COMPLETE  COMPARISON:  02/24/2013  FINDINGS: Gallbladder: No gallstones, gallbladder wall thickening, or pericholecystic fluid. Negative sonographic Murphy's sign.  Common bile duct: Diameter: 3 mm  Liver: No focal lesion identified. Within normal limits in parenchymal echogenicity.  IVC: No abnormality visualized.  Pancreas: Enlarged/mildly heterogeneous with peripancreatic fluid.  Spleen: Size and appearance within normal limits.  Right Kidney: Length: 10.4 cm.  No mass or hydronephrosis.  Left Kidney: Length: 11.3 cm.  No mass or hydronephrosis.  Abdominal aorta: No aneurysm visualized.  Other findings: None.  IMPRESSION: Acute pancreatitis with mild peripancreatic fluid.  Otherwise negative abdominal ultrasound.   Electronically Signed   By: Charline Bills M.D.   On: 12/20/2014 19:53   Ct Abdomen Pelvis W Contrast  12/23/2014   CLINICAL DATA:  Abdominal pain.  Alcohol induced pancreatitis.  EXAM: CT ABDOMEN AND PELVIS WITH CONTRAST  TECHNIQUE: Multidetector CT imaging of the abdomen and pelvis was performed using the standard protocol following bolus administration of intravenous contrast.  CONTRAST:  80mL OMNIPAQUE IOHEXOL 300 MG/ML  SOLN  COMPARISON:  Ultrasound 12/20/2014.  CT 02/24/2004.  FINDINGS: Lower chest: Small to moderate bilateral pleural effusions, larger on the left with associated bibasilar atelectasis. No significant pericardial effusion.  Hepatobiliary: There is a 1.8 cm low-density lesion in the dome of the left hepatic lobe on image 15. This is not well seen on the prior examination due to motion but demonstrates probable  peripheral enhancement and is suggestive of a hemangioma. No suspicious liver lesions demonstrated. No evidence of gallstones, gallbladder wall thickening or biliary dilatation.  Pancreas: The pancreatic body and tail are mildly enlarged with surrounding inflammatory change consistent with pancreatitis. No evidence of pancreatic ductal dilatation, necrosis, hemorrhage, calcification or focal surrounding fluid collection.  Spleen: Normal in size without focal abnormality.  Adrenals/Urinary Tract: Both adrenal glands appear normal.The kidneys appear normal without evidence of urinary tract calculus, suspicious lesion or hydronephrosis. No bladder abnormalities are seen.  Stomach/Bowel: No evidence of bowel wall thickening, distention or surrounding inflammatory change.The appendix appears normal.  Vascular/Lymphatic: There are no enlarged abdominal or pelvic lymph nodes. No significant vascular findings. The portal, superior mesenteric and splenic veins are patent.  Reproductive: The uterus and ovaries appear normal. There is no adnexal mass. 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  Other: There is a moderate amount of ascites throughout the peritoneal cavity with generalized mesenteric edema. No focal extraluminal fluid collections identified.  Musculoskeletal: No acute or significant osseous findings. The femoral heads appear normal.  IMPRESSION: 1. Enlargement of the pancreatic body and tail with surrounding inflammation consistent with uncomplicated pancreatitis. 2. Ascites  and bilateral pleural effusions. 3. Left gonadal vein varices noted incidentally. 4. Probable incidental hemangioma within the dome of the left hepatic lobe.   Electronically Signed   By: Carey BullocksWilliam  Veazey M.D.   On: 12/23/2014 19:43     Erasmo DownerAngela M Bacigalupo, MD 12/24/2014, 9:58 AM PGY-1, [facility] FPTS Intern pager: [phone], text pages welcome

## 2014-12-25 LAB — BASIC METABOLIC PANEL
Anion gap: 5 (ref 5–15)
BUN: <5 mg/dL — ABNORMAL LOW (ref 6–20)
CALCIUM: 8.4 mg/dL — AB (ref 8.9–10.3)
CO2: 23 mmol/L (ref 22–32)
Chloride: 109 mmol/L (ref 101–111)
Creatinine, Ser: 0.44 mg/dL (ref 0.44–1.00)
Glucose, Bld: 104 mg/dL — ABNORMAL HIGH (ref 65–99)
Potassium: 3.4 mmol/L — ABNORMAL LOW (ref 3.5–5.1)
SODIUM: 137 mmol/L (ref 135–145)

## 2014-12-25 LAB — CBC
HCT: 23.9 % — ABNORMAL LOW (ref 36.0–46.0)
Hemoglobin: 8 g/dL — ABNORMAL LOW (ref 12.0–15.0)
MCH: 26.5 pg (ref 26.0–34.0)
MCHC: 33.5 g/dL (ref 30.0–36.0)
MCV: 79.1 fL (ref 78.0–100.0)
PLATELETS: 323 10*3/uL (ref 150–400)
RBC: 3.02 MIL/uL — ABNORMAL LOW (ref 3.87–5.11)
RDW: 19.1 % — ABNORMAL HIGH (ref 11.5–15.5)
WBC: 4.1 10*3/uL (ref 4.0–10.5)

## 2014-12-25 NOTE — Progress Notes (Signed)
Family Medicine Teaching Service Daily Progress Note Intern Pager: [phone]  Patient name: Kristina Moyer Medical record number: [id number] Date of birth: 10/06/1980 Age: 34 y.o. Gender: female  Primary Care Provider: No PCP Per Patient Consultants: None Code Status: Full  Pt Overview and Major Events to Date:  5/30 - Admitted with acute on chronic pancreatitis   Assessment and Plan:  Kristina Moyer is a 34 y.o. female presenting with abdominal pain . PMH is significant for ETOH abuse, chronic pancreatitis, Iron deficiency anemia  Abdominal pain 2/2 Acute on chronic pancreatitis: Lipase 933 on admission. Epigastric pain with some guarding only in epigastrium.Abdominal US consistent with acute pancreatitis with mild peripancreatic fluid. Hemodynamically stable.CT abd/pelvis 6/2 shows uncomplicated pancreatitis.  CRP 11.4 - Advance to full liquid diet  - Continue Scheduled Norco 5mg  q4  - Continue Dilaudid 0.5mg  IV q4 PRN severe pain (1mg  in last 24h) - Zofran PRN nausea - Serial abdominal exams  Gonorrhea/Trichomoniasis. [facility] to [facility]. Has not been treated. Low suspicion for PID given lack of fevers and normal WBC.   HIV and RPR nonreactive - s/p 250mg  ceftriaxone, 1g azithromycin, 2g metronidazole  Fever: Resolved. Tmax 100.5 5/31 at 2100, afebrile since.  Possibly related to ongoing inflammation from pancreatitis vs infection (PID vs intraabdominal process). UA noninfectious, UCx no growth. - BCx NGTD  Normocytic anemia: Hgb downtrending 8.6 (6/1) > 7.8 >8.0 - baseline 9.3-9.8 - Continue trend hemoglobin  ETOH abuse: Drinks 2 margarita drinks a day on average 5x/week. Drank 6 alcoholic beverages this past weekend.  -CIWA protocol - has not required ativan -c/s Social Work  -continued alcohol cessation counseling, esp in light of chronic pancreatitis  Tobacco dependence: Smokes 1ppd -Nicotine patch PRN -Continue smoking cessation  counseling, esp in light of chronic pancreatitis  FEN/GI: NS @150cc /h, Advance to Full liq diet  Prophylaxis: Sub-q heparin  Disposition: Admitted pending improvement in symptoms  Subjective:  Patient reports that she is doing well this morning.  She denies nausea, vomiting, weakness.  She reports that she was able to ambulate without difficulty yesterday.  She is asking for a diet this am.  We discussed need to advance very slowly.  She would like to try full liquids this am.  Having BM normally.  No constipation or straining.  Pain medication working well to relieve pain.  Objective: Temp:  [98.4 F (36.9 C)-98.8 F (37.1 C)] 98.4 F (36.9 C) (06/04 0557) Pulse Rate:  [61-66] 61 (06/04 0557) Resp:  [16-17] 16 (06/04 0557) BP: (136-139)/(80-89) 136/87 mmHg (06/04 0557) SpO2:  [100 %] 100 % (06/04 0557) Physical Exam: General: resting in bed, NAD Cardiovascular: RRR, no murmurs or rubs appreciated Respiratory: NWOB, CTAB with no crackles/wheezes Abdomen: +BS, mildly distended, improving TTP over epigastrium. No rebound or guarding Extremities: WWP, no cyanosis or edema Neuro: follows commands, no focal deficits.   Laboratory/Imaging/Diagnostic Testing:  Recent Labs Lab 12/22/14 0514 12/24/14 0604 12/25/14 0428  WBC 6.8 5.2 4.1  HGB 8.6* 7.8* 8.0*  HCT 26.4* 23.8* 23.9*  PLT 286 289 323    Recent Labs Lab 12/20/14 1034  12/21/14 0548 12/22/14 0514 12/24/14 0604 12/25/14 0428  NA 138  < > 133* 131* 136 137  K 3.4*  < > 3.5 3.3* 3.2* 3.4*  CL 107  < > 107 103 110 109  CO2 19*  --  19* 18* 20* 23  BUN <5*  < > <5* <5* <5* <5*  CREATININE 0.62  < >  0.51 0.56 0.40* 0.44  CALCIUM 8.8*  --  7.7* 7.8* 8.0* 8.4*  PROT 6.1*  --  5.3* 5.0*  --   --   BILITOT 0.9  --  1.0 0.8  --   --   ALKPHOS 58  --  49 43  --   --   ALT 11*  --  8* 9*  --   --   AST 21  --  15 17  --   --   GLUCOSE 116*  < > 78 72 102* 104*  < > = values in this interval not  displayed.  Urinalysis    Component Value Date/Time   COLORURINE YELLOW 12/22/2014 1100   APPEARANCEUR CLEAR 12/22/2014 1100   LABSPEC 1.008 12/22/2014 1100   PHURINE 6.0 12/22/2014 1100   GLUCOSEU NEGATIVE 12/22/2014 1100   HGBUR NEGATIVE 12/22/2014 1100   BILIRUBINUR NEGATIVE 12/22/2014 1100   KETONESUR >80* 12/22/2014 1100   PROTEINUR NEGATIVE 12/22/2014 1100   UROBILINOGEN 0.2 12/22/2014 1100   NITRITE NEGATIVE 12/22/2014 1100   LEUKOCYTESUR NEGATIVE 12/22/2014 1100   No new images   Raliegh Ip, DO 12/25/2014, 6:25 AM PGY-1, [place] [facility] FPTS Intern pager: [phone], text pages welcome

## 2014-12-26 LAB — CBC
HCT: 24.1 % — ABNORMAL LOW (ref 36.0–46.0)
HEMOGLOBIN: 8.1 g/dL — AB (ref 12.0–15.0)
MCH: 26.4 pg (ref 26.0–34.0)
MCHC: 33.6 g/dL (ref 30.0–36.0)
MCV: 78.5 fL (ref 78.0–100.0)
PLATELETS: 330 10*3/uL (ref 150–400)
RBC: 3.07 MIL/uL — AB (ref 3.87–5.11)
RDW: 19 % — AB (ref 11.5–15.5)
WBC: 3.8 10*3/uL — AB (ref 4.0–10.5)

## 2014-12-26 LAB — BASIC METABOLIC PANEL
ANION GAP: 5 (ref 5–15)
CHLORIDE: 108 mmol/L (ref 101–111)
CO2: 26 mmol/L (ref 22–32)
CREATININE: 0.47 mg/dL (ref 0.44–1.00)
Calcium: 8.5 mg/dL — ABNORMAL LOW (ref 8.9–10.3)
GFR calc Af Amer: >60 mL/min (ref >60)
GFR calc non Af Amer: >60 mL/min (ref >60)
Glucose, Bld: 94 mg/dL (ref 65–99)
Potassium: 3.3 mmol/L — ABNORMAL LOW (ref 3.5–5.1)
Sodium: 139 mmol/L (ref 135–145)

## 2014-12-26 MED ORDER — POTASSIUM CHLORIDE CRYS ER 20 MEQ PO TBCR
20.0000 meq | EXTENDED_RELEASE_TABLET | Freq: Once | ORAL | Status: AC
  Administered 2014-12-26: 20 meq via ORAL
  Filled 2014-12-26: qty 1

## 2014-12-26 NOTE — Evaluation (Addendum)
Physical Therapy Evaluation/Discharge Patient Details Name: Kristina ShaggyLatoya S Balin MRN: [id number] DOB: 1980/10/12 Today's Date: 12/26/2014   History of Present Illness  Pt is a 34 y/o F w/ prior h/o chronic pancreatitis admitted w/ abdominal pain.  Pt's PMH includes ETOH use, chronic pancreatitis, anemia.  Clinical Impression  Pt admitted with above diagnosis. Pt able to ambulate and perform all mobility/transfers w/o assist (independently) and safely; therefore is appropriate for d/c home w/ no PT follow up, PT signing off at this time.    Follow Up Recommendations No PT follow up    Equipment Recommendations  None recommended by PT    Recommendations for Other Services       Precautions / Restrictions Restrictions Weight Bearing Restrictions: No      Mobility  Bed Mobility Overal bed mobility: Independent                Transfers Overall transfer level: Independent                  Ambulation/Gait Ambulation/Gait assistance: Independent     Gait Pattern/deviations: WFL(Within Functional Limits)   Gait velocity interpretation: at or above normal speed for age/gender    Stairs            Wheelchair Mobility    Modified Rankin (Stroke Patients Only)       Balance Overall balance assessment: Independent                                           Pertinent Vitals/Pain Pain Assessment: No/denies pain Pain Intervention(s): Limited activity within patient's tolerance;Monitored during session    Home Living Family/patient expects to be discharged to:: Private residence Living Arrangements: Parent;Children (Mom, 3 children (ages 4513, 477, 8)) Available Help at Discharge: Family;Available 24 hours/day Type of Home: House Home Access: Level entry     Home Layout: One level Home Equipment: None      Prior Function Level of Independence: Independent               Hand Dominance   Dominant Hand: Right    Extremity/Trunk  Assessment   Upper Extremity Assessment: Overall WFL for tasks assessed           Lower Extremity Assessment: Overall WFL for tasks assessed         Communication   Communication: No difficulties  Cognition Arousal/Alertness: Awake/alert Behavior During Therapy: WFL for tasks assessed/performed Overall Cognitive Status: Within Functional Limits for tasks assessed                      General Comments      Exercises        Assessment/Plan    PT Assessment Patent does not need any further PT services  PT Diagnosis Abnormality of gait   PT Problem List    PT Treatment Interventions     PT Goals (Current goals can be found in the Care Plan section) Acute Rehab PT Goals Patient Stated Goal: to go home     Frequency     Barriers to discharge        Co-evaluation               End of Session   Activity Tolerance: Patient tolerated treatment well Patient left: with call bell/phone within reach (in bathroom on toilet) Nurse Communication: Mobility status (Pt on toilet )  Time: 1541-1550 PT Time Calculation (min) (ACUTE ONLY): 9 min   Charges:   PT Evaluation $Initial PT Evaluation Tier I: 1 Procedure     PT G Codes:       Michail Jewels PT, DPT [phone] Pager: [phone] 12/26/2014, 4:06 PM

## 2014-12-26 NOTE — Progress Notes (Signed)
Family Medicine Teaching Service Daily Progress Note Intern Pager: [phone]  Patient name: Kristina Moyer Medical record number: [id number] Date of birth: 03/10/1981 Age: 34 y.o. Gender: female  Primary Care Provider: No PCP Per Patient Consultants: None Code Status: Full  Pt Overview and Major Events to Date:  5/30 - Admitted with acute on chronic pancreatitis   Assessment and Plan:  Kristina Moyer is a 34 y.o. female presenting with abdominal pain . PMH is significant for ETOH abuse, chronic pancreatitis, Iron deficiency anemia  Abdominal pain 2/2 Acute on chronic pancreatitis: Lipase 933 on admission. Epigastric pain with some guarding only in epigastrium.Abdominal US consistent with acute pancreatitis with mild peripancreatic fluid. Hemodynamically stable.CT abd/pelvis 6/2 shows uncomplicated pancreatitis.  CRP 11.4 - Continue full liquid diet, ADAT - possibly to soft later today - Continue Scheduled Norco  q4 - can consider un-scheduling if pain improving - Continue Dilaudid 0.5mg  IV q4 PRN severe pain (0.5mg  in last 24h) - Zofran PRN nausea  Gonorrhea/Trichomoniasis. Noted on STD screening at [facility] the week prior to admission. Has not been treated. Low suspicion for PID given lack of fevers and normal WBC.   HIV and RPR nonreactive - s/p  ceftriaxone, 1g azithromycin, 2g metronidazole  Fever: Resolved. Tmax 100.5 5/31 at 2100, afebrile since.  Possibly related to ongoing inflammation from pancreatitis vs infection (PID vs intraabdominal process). UA noninfectious, UCx no growth. - BCx NGTD  Normocytic anemia: Hgb downtrending 8.6 (6/1) > 7.8 >8.0>8.1 - baseline 9.3-9.8 - Continue trend hemoglobin  Hypokalemia: mild. K 3.3. - Replete with KDur - Continue to monitor  ETOH abuse: Drinks 2 margarita drinks a day on average 5x/week. Drank 6 alcoholic beverages this past weekend.  -CIWA protocol - has not required ativan -c/s Social Work  -continued alcohol  cessation counseling, esp in light of chronic pancreatitis  Tobacco dependence: Smokes 1ppd -Nicotine patch PRN -Continue smoking cessation counseling, esp in light of chronic pancreatitis  FEN/GI: NS /h, Full liq diet, ADAT Prophylaxis: Sub-q heparin  Disposition: Admitted pending improvement in symptoms  Subjective:  Patient reports that she is doing well this morning.  She denies nausea, vomiting, weakness.  Tolerated grits for breakfast this AM.  Having BM normally. Pain medication working well to relieve pain.  Objective: Temp:  [98.4 F (36.9 C)-98.7 F (37.1 C)] 98.4 F (36.9 C) (06/05 0650) Pulse Rate:  [58-68] 64 (06/05 0650) Resp:  [16] 16 (06/05 0650) BP: (134-153)/(79-92) 134/79 mmHg (06/05 0650) SpO2:  [98 %-100 %] 98 % (06/05 0650) Physical Exam: General: resting in bed, NAD Cardiovascular: RRR, no murmurs or rubs appreciated Respiratory: NWOB, CTAB with no crackles/wheezes Abdomen: +BS, mildly distended, improving TTP over epigastrium, LUQ, LLQ. No rebound or guarding Extremities: WWP, no cyanosis or edema Neuro: follows commands, no focal deficits.   Laboratory/Imaging/Diagnostic Testing:  Recent Labs Lab 12/24/14 0604 12/25/14 0428 12/26/14 0555  WBC 5.2 4.1 3.8*  HGB 7.8* 8.0* 8.1*  HCT 23.8* 23.9* 24.1*  PLT 289 323 330    Recent Labs Lab 12/20/14 1034  12/21/14 0548 12/22/14 0514 12/24/14 0604 12/25/14 0428 12/26/14 0555  NA 138  < > 133* 131* 136 137 139  K 3.4*  < > 3.5 3.3* 3.2* 3.4* 3.3*  CL 107  < > 107 103 110 109 108  CO2 19*  --  19* 18* 20* 23 26  BUN <5*  < > <5* <5* <5* <5* <5*  CREATININE 0.62  < > 0.51 0.56 0.40* 0.44 0.47  CALCIUM 8.8*  --  7.7* 7.8* 8.0* 8.4* 8.5*  PROT 6.1*  --  5.3* 5.0*  --   --   --   BILITOT 0.9  --  1.0 0.8  --   --   --   ALKPHOS 58  --  49 43  --   --   --   ALT 11*  --  8* 9*  --   --   --   AST 21  --  15 17  --   --   --   GLUCOSE 116*  < > 78 72 102* 104* 94  < > = values in this  interval not displayed.  Urinalysis    Component Value Date/Time   COLORURINE YELLOW 12/22/2014 1100   APPEARANCEUR CLEAR 12/22/2014 1100   LABSPEC 1.008 12/22/2014 1100   PHURINE 6.0 12/22/2014 1100   GLUCOSEU NEGATIVE 12/22/2014 1100   HGBUR NEGATIVE 12/22/2014 1100   BILIRUBINUR NEGATIVE 12/22/2014 1100   KETONESUR >80* 12/22/2014 1100   PROTEINUR NEGATIVE 12/22/2014 1100   UROBILINOGEN 0.2 12/22/2014 1100   NITRITE NEGATIVE 12/22/2014 1100   LEUKOCYTESUR NEGATIVE 12/22/2014 1100   No new images   Erasmo DownerAngela M Petra Dumler, MD 12/26/2014, 8:56 AM PGY-1, [facility] FPTS Intern pager: [phone], text pages welcome

## 2014-12-27 LAB — CBC
HCT: 23.6 % — ABNORMAL LOW (ref 36.0–46.0)
HEMOGLOBIN: 7.9 g/dL — AB (ref 12.0–15.0)
MCH: 26.3 pg (ref 26.0–34.0)
MCHC: 33.5 g/dL (ref 30.0–36.0)
MCV: 78.7 fL (ref 78.0–100.0)
Platelets: 347 10*3/uL (ref 150–400)
RBC: 3 MIL/uL — ABNORMAL LOW (ref 3.87–5.11)
RDW: 19 % — ABNORMAL HIGH (ref 11.5–15.5)
WBC: 4.7 10*3/uL (ref 4.0–10.5)

## 2014-12-27 LAB — BASIC METABOLIC PANEL
ANION GAP: 8 (ref 5–15)
BUN: <5 mg/dL — ABNORMAL LOW (ref 6–20)
CALCIUM: 8.5 mg/dL — AB (ref 8.9–10.3)
CO2: 24 mmol/L (ref 22–32)
CREATININE: 0.46 mg/dL (ref 0.44–1.00)
Chloride: 105 mmol/L (ref 101–111)
GFR calc non Af Amer: >60 mL/min (ref >60)
Glucose, Bld: 94 mg/dL (ref 65–99)
POTASSIUM: 3.5 mmol/L (ref 3.5–5.1)
Sodium: 137 mmol/L (ref 135–145)

## 2014-12-27 MED ORDER — HYDROCODONE-ACETAMINOPHEN 5-325 MG PO TABS
1.0000 | ORAL_TABLET | ORAL | Status: DC | PRN
  Administered 2014-12-27 – 2014-12-29 (×5): 1 via ORAL
  Filled 2014-12-27 (×5): qty 1

## 2014-12-27 NOTE — Progress Notes (Signed)
Family Medicine Teaching Service Daily Progress Note Intern Pager: [phone]  Patient name: Kristina Moyer Medical record number: [id number] Date of birth: 1980-10-28 Age: 34 y.o. Gender: female  Primary Care Provider: No PCP Per Patient Consultants: None Code Status: Full  Pt Overview and Major Events to Date:  5/30 - Admitted with acute on chronic pancreatitis   Assessment and Plan:  Kristina Moyer is a 34 y.o. female presenting with abdominal pain . PMH is significant for ETOH abuse, chronic pancreatitis, Iron deficiency anemia  Abdominal pain 2/2 Acute on chronic pancreatitis: Lipase 933 on admission. Epigastric pain with some guarding only in epigastrium.Abdominal US consistent with acute pancreatitis with mild peripancreatic fluid. Hemodynamically stable.CT abd/pelvis 6/2 shows uncomplicated pancreatitis.  CRP 11.4 - Advance to soft diet, ADAT - maybe regular diet for dinner - Continue Norco  q4h prn - previously scheduled - Continue Dilaudid 0.5mg  IV q4 PRN severe pain (0.5mg  in last 24h) - Zofran PRN nausea  Gonorrhea/Trichomoniasis. Noted on STD screening at [facility] the week prior to admission. Has not been treated. Low suspicion for PID given lack of fevers and normal WBC.   HIV and RPR nonreactive - s/p  ceftriaxone, 1g azithromycin, 2g metronidazole  Fever: Resolved. Tmax 100.5 5/31 at 2100, afebrile since.  Possibly related to ongoing inflammation from pancreatitis vs infection (PID vs intraabdominal process). UA noninfectious, UCx no growth. - BCx NGTD  Normocytic anemia: Stable. Hgb 8.6 (6/1) > 7.8 >8.0>8.1 >7.9 - baseline 9.3-9.8 - Continue trend hemoglobin  Hypokalemia: Resolved.  ETOH abuse: Drinks 2 margarita drinks a day on average 5x/week. Drank 6 alcoholic beverages this past weekend.  -CIWA protocol - has not required ativan -c/s Social Work  -continued alcohol cessation counseling, esp in light of chronic pancreatitis  Tobacco dependence: Smokes  1ppd -Nicotine patch PRN -Continue smoking cessation counseling, esp in light of chronic pancreatitis  FEN/GI: NS /h, advance to soft diet, ADAT  Prophylaxis: Sub-q heparin  Disposition: Admitted pending improvement in symptoms. If tolerating advanced diet and PO pain meds today, possible d/c tomorrow.  Subjective:  Patient reports that she is doing well this morning.  She denies nausea, vomiting, weakness.  Tolerated grits for breakfast this AM, but tired of this same food.  Having BM normally. Pain medication working well to relieve pain.  Objective: Temp:  [98.3 F (36.8 C)-98.8 F (37.1 C)] 98.4 F (36.9 C) (06/06 0549) Pulse Rate:  [53-64] 61 (06/06 0549) Resp:  [16] 16 (06/06 0549) BP: (136-145)/(78-88) 145/88 mmHg (06/06 0549) SpO2:  [98 %-100 %] 100 % (06/06 0549) Physical Exam: General: resting in bed, NAD Cardiovascular: RRR, no murmurs or rubs appreciated Respiratory: NWOB, CTAB with no crackles/wheezes Abdomen: +BS, mildly distended, improving TTP over epigastrium, LUQ, LLQ. No rebound or guarding Extremities: WWP, no cyanosis or edema Neuro: follows commands, no focal deficits.   Laboratory/Imaging/Diagnostic Testing:  Recent Labs Lab 12/25/14 0428 12/26/14 0555 12/27/14 0352  WBC 4.1 3.8* 4.7  HGB 8.0* 8.1* 7.9*  HCT 23.9* 24.1* 23.6*  PLT 323 330 347    Recent Labs Lab 12/20/14 1034  12/21/14 0548 12/22/14 0514  12/25/14 0428 12/26/14 0555 12/27/14 0352  NA 138  < > 133* 131*  < > 137 139 137  K 3.4*  < > 3.5 3.3*  < > 3.4* 3.3* 3.5  CL 107  < > 107 103  < > 109 108 105  CO2 19*  --  19* 18*  < > BUN <5*  < > <  5* <5*  < > <5* <5* <5*  CREATININE 0.62  < > 0.51 0.56  < > 0.44 0.47 0.46  CALCIUM 8.8*  --  7.7* 7.8*  < > 8.4* 8.5* 8.5*  PROT 6.1*  --  5.3* 5.0*  --   --   --   --   BILITOT 0.9  --  1.0 0.8  --   --   --   --   ALKPHOS 58  --  49 43  --   --   --   --   ALT 11*  --  8* 9*  --   --   --   --   AST 21  --  15 17   --   --   --   --   GLUCOSE 116*  < > 78 72  < > 104* 94 94  < > = values in this interval not displayed.  Urinalysis    Component Value Date/Time   COLORURINE YELLOW 12/22/2014 1100   APPEARANCEUR CLEAR 12/22/2014 1100   LABSPEC 1.008 12/22/2014 1100   PHURINE 6.0 12/22/2014 1100   GLUCOSEU NEGATIVE 12/22/2014 1100   HGBUR NEGATIVE 12/22/2014 1100   BILIRUBINUR NEGATIVE 12/22/2014 1100   KETONESUR >80* 12/22/2014 1100   PROTEINUR NEGATIVE 12/22/2014 1100   UROBILINOGEN 0.2 12/22/2014 1100   NITRITE NEGATIVE 12/22/2014 1100   LEUKOCYTESUR NEGATIVE 12/22/2014 1100   No new images   Erasmo DownerAngela M Bacigalupo, MD 12/27/2014, 9:40 AM PGY-1, [place] [facility] FPTS Intern pager: [phone], text pages welcome

## 2014-12-27 NOTE — Care Management Note (Signed)
Case Management Note  Patient Details  Name: Kristina Moyer MRN: [id number] Date of Birth: 1980-11-26  Subjective/Objective:                    Action/Plan:  UR updated  Expected Discharge Date:    12-27-14               Expected Discharge Plan:  Home/Self Care  In-House Referral:     Discharge planning Services  CM Consult, Indigent Health Clinic  Post Acute Care Choice:    Choice offered to:     DME Arranged:    DME Agency:     HH Arranged:    HH Agency:     Status of Service:  In process, will continue to follow  Medicare Important Message Given:    Date Medicare IM Given:    Medicare IM give by:    Date Additional Medicare IM Given:    Additional Medicare Important Message give by:     If discussed at Long Length of Stay Meetings, dates discussed:    Additional Comments:  Kingsley PlanWile, Harmoney Sienkiewicz [name], RN 12/27/2014, 2:49 PM

## 2014-12-27 NOTE — ED Notes (Signed)
Attempted to contact patient to discuss lab report of positive GC finding, and to inquire if she was treated for same while in ED. Number listed for home contact is not valid, per another female who answered phone. Called mobile number, but this number id not able to receive calls at this time. As no define treatment is documented, shall assume no treatment was given. Discussed w MD, and will [facility] of GC. Letter sent

## 2014-12-28 LAB — CULTURE, BLOOD (ROUTINE X 2)
CULTURE: NO GROWTH
Culture: NO GROWTH

## 2014-12-28 LAB — CBC
HEMATOCRIT: 24.6 % — AB (ref 36.0–46.0)
Hemoglobin: 8.1 g/dL — ABNORMAL LOW (ref 12.0–15.0)
MCH: 25.7 pg — ABNORMAL LOW (ref 26.0–34.0)
MCHC: 32.9 g/dL (ref 30.0–36.0)
MCV: 78.1 fL (ref 78.0–100.0)
PLATELETS: 385 10*3/uL (ref 150–400)
RBC: 3.15 MIL/uL — ABNORMAL LOW (ref 3.87–5.11)
RDW: 19.1 % — AB (ref 11.5–15.5)
WBC: 4.7 10*3/uL (ref 4.0–10.5)

## 2014-12-28 LAB — BASIC METABOLIC PANEL
Anion gap: 9 (ref 5–15)
BUN: <5 mg/dL — ABNORMAL LOW (ref 6–20)
CO2: 26 mmol/L (ref 22–32)
CREATININE: 0.49 mg/dL (ref 0.44–1.00)
Calcium: 8.7 mg/dL — ABNORMAL LOW (ref 8.9–10.3)
Chloride: 103 mmol/L (ref 101–111)
GFR calc non Af Amer: >60 mL/min (ref >60)
GLUCOSE: 92 mg/dL (ref 65–99)
POTASSIUM: 3.3 mmol/L — AB (ref 3.5–5.1)
SODIUM: 138 mmol/L (ref 135–145)

## 2014-12-28 MED ORDER — POTASSIUM CHLORIDE CRYS ER 20 MEQ PO TBCR
20.0000 meq | EXTENDED_RELEASE_TABLET | Freq: Once | ORAL | Status: AC
  Administered 2014-12-28: 20 meq via ORAL
  Filled 2014-12-28: qty 1

## 2014-12-28 NOTE — Care Management Note (Signed)
Case Management Note  Patient Details  Name: Jaclyn ShaggyLatoya S Goodin MRN: [id number] Date of Birth: September 21, 1980  Subjective/Objective:                    Action/Plan:   Expected Discharge Date:      12-28-14            Expected Discharge Plan:  Home/Self Care  In-House Referral:     Discharge planning Services  CM Consult, Indigent Health Clinic  Post Acute Care Choice:    Choice offered to:     DME Arranged:    DME Agency:     HH Arranged:    HH Agency:     Status of Service:  In process, will continue to follow  Medicare Important Message Given:    Date Medicare IM Given:    Medicare IM give by:    Date Additional Medicare IM Given:    Additional Medicare Important Message give by:     If discussed at Long Length of Stay Meetings, dates discussed:  12-28-14  Additional Comments:  Kingsley PlanWile, Amayra Kiedrowski [name], RN 12/28/2014, 10:19 AM

## 2014-12-28 NOTE — Progress Notes (Signed)
Family Medicine Teaching Service Daily Progress Note Intern Pager: [phone]  Patient name: Kristina Moyer Medical record number: [id number] Date of birth: 08/31/1980 Age: 34 y.o. Gender: female  Primary Care Provider: No PCP Per Patient Consultants: None Code Status: Full  Pt Overview and Major Events to Date:  5/30 - Admitted with acute on chronic pancreatitis   Assessment and Plan:  Kristina Moyer is a 34 y.o. female presenting with abdominal pain . PMH is significant for ETOH abuse, chronic pancreatitis, Iron deficiency anemia  Abdominal pain 2/2 Acute on chronic pancreatitis: Lipase 933 on admission. Epigastric pain with some guarding only in epigastrium.Abdominal US consistent with acute pancreatitis with mild peripancreatic fluid. Hemodynamically stable.CT abd/pelvis 6/2 shows uncomplicated pancreatitis.  CRP 11.4 - Continue soft diet - Continue Norco 5mg  q4h prn - previously scheduled - d/c Dilaudid - Zofran PRN nausea  Gonorrhea/Trichomoniasis. [facility]. Has not been treated. Low suspicion for PID given lack of fevers and normal WBC.   HIV and RPR nonreactive - s/p 250mg  ceftriaxone, 1g azithromycin, 2g metronidazole  Fever: Resolved. Tmax 100.5 5/31 at 2100, afebrile since.  Possibly related to ongoing inflammation from pancreatitis vs infection (PID vs intraabdominal process). UA noninfectious, UCx no growth. - BCx NGTD  Normocytic anemia: Stable. Hgb 8.6 (6/1) > 7.8 >8.0>8.1 >7.9 - baseline 9.3-9.8 - Continue trend hemoglobin  Hypokalemia: K 3.3. - replete with KDur 20mEq  ETOH abuse: Drinks 2 margarita drinks a day on average 5x/week. Drank 6 alcoholic beverages this past weekend. Reports that she will not drink anymore -CIWA protocol - has not required ativan -c/s Social Work  -continued alcohol cessation counseling, esp in light of chronic pancreatitis  Tobacco dependence: Smokes 1ppd -Nicotine patch  PRN -Continue smoking cessation counseling, esp in light of chronic pancreatitis  FEN/GI: KVO, soft diet Prophylaxis: Sub-q heparin  Disposition: Admitted pending improvement in symptoms. If tolerating advanced diet and PO pain meds today, possible d/c tomorrow.  Subjective:  Patient reports that she is doing well this morning.  She denies nausea, vomiting, weakness.  Pain increased last night after dinner and required IV Dilaudid.  Having BM normally.   Objective: Temp:  [98.6 F (37 C)-99.2 F (37.3 C)] 98.6 F (37 C) (06/07 0553) Pulse Rate:  [60-73] 60 (06/07 0553) Resp:  [18-19] 19 (06/07 0553) BP: (119-130)/(66-83) 119/66 mmHg (06/07 0553) SpO2:  [99 %] 99 % (06/07 0553) Physical Exam: General: resting in bed, NAD Cardiovascular: RRR, no murmurs or rubs appreciated Respiratory: NWOB, CTAB with no crackles/wheezes Abdomen: +BS, mildly distended, improving TTP over epigastrium, LUQ, LLQ. No rebound or guarding Extremities: WWP, no cyanosis or edema Neuro: follows commands, no focal deficits.   Laboratory/Imaging/Diagnostic Testing:  Recent Labs Lab 12/26/14 0555 12/27/14 0352 12/28/14 0531  WBC 3.8* 4.7 4.7  HGB 8.1* 7.9* 8.1*  HCT 24.1* 23.6* 24.6*  PLT 330 347 385    Recent Labs Lab 12/22/14 0514  12/26/14 0555 12/27/14 0352 12/28/14 0531  NA 131*  < > 139 137 138  K 3.3*  < > 3.3* 3.5 3.3*  CL 103  < > 108 105 103  CO2 18*  < > 26 24 26   BUN <5*  < > <5* <5* <5*  CREATININE 0.56  < > 0.47 0.46 0.49  CALCIUM 7.8*  < > 8.5* 8.5* 8.7*  PROT 5.0*  --   --   --   --   BILITOT 0.8  --   --   --   --  ALKPHOS 43  --   --   --   --   ALT 9*  --   --   --   --   AST 17  --   --   --   --   GLUCOSE 72  < > 94 94 92  < > = values in this interval not displayed.  Urinalysis    Component Value Date/Time   COLORURINE YELLOW 12/22/2014 1100   APPEARANCEUR CLEAR 12/22/2014 1100   LABSPEC 1.008 12/22/2014 1100   PHURINE 6.0 12/22/2014 1100   GLUCOSEU  NEGATIVE 12/22/2014 1100   HGBUR NEGATIVE 12/22/2014 1100   BILIRUBINUR NEGATIVE 12/22/2014 1100   KETONESUR >80* 12/22/2014 1100   PROTEINUR NEGATIVE 12/22/2014 1100   UROBILINOGEN 0.2 12/22/2014 1100   NITRITE NEGATIVE 12/22/2014 1100   LEUKOCYTESUR NEGATIVE 12/22/2014 1100   No new images   Erasmo Downer, MD 12/28/2014, 1:34 PM PGY-1, [place] [facility] FPTS Intern pager: [phone], text pages welcome

## 2014-12-29 LAB — BASIC METABOLIC PANEL
ANION GAP: 8 (ref 5–15)
CO2: 25 mmol/L (ref 22–32)
Calcium: 8.9 mg/dL (ref 8.9–10.3)
Chloride: 101 mmol/L (ref 101–111)
Creatinine, Ser: 0.47 mg/dL (ref 0.44–1.00)
GFR calc non Af Amer: >60 mL/min (ref >60)
Glucose, Bld: 103 mg/dL — ABNORMAL HIGH (ref 65–99)
POTASSIUM: 3.3 mmol/L — AB (ref 3.5–5.1)
Sodium: 134 mmol/L — ABNORMAL LOW (ref 135–145)

## 2014-12-29 MED ORDER — HYDROCODONE-ACETAMINOPHEN 5-325 MG PO TABS: 1.0000 | ORAL_TABLET | ORAL | Status: DC | PRN

## 2014-12-29 NOTE — Progress Notes (Signed)
Pt discharged to home.  Discharge instructions explained to pt.  IV removed.  Pt has no questions at the time of discharge.  Pt ambulated out on hospital on own. Shearon StallsJose D Jeane Cashatt, RN

## 2014-12-30 NOTE — ED Notes (Signed)
Called and left message w female at residence that patient needs to call [place] at the [facility]

## 2014-12-31 NOTE — ED Notes (Signed)
Call to residence, letter to residence have not elicited response from patient. Call to [facility] at [facility] , left detailed VM for her to contact. Discussed w Gearldine Bienenstock , and she is to attempt to contact patient, by other methods. Has no record of her coming in to HD for treatment .

## 2015-01-11 ENCOUNTER — Inpatient Hospital Stay: Payer: Self-pay | Admitting: Internal Medicine

## 2015-03-17 ENCOUNTER — Encounter (HOSPITAL_COMMUNITY): Payer: Self-pay

## 2015-03-17 ENCOUNTER — Emergency Department (HOSPITAL_COMMUNITY)
Admission: EM | Admit: 2015-03-17 | Discharge: 2015-03-17 | Disposition: A | Discharge status: Home / Self Care | Payer: Medicaid Other | Attending: Emergency Medicine | Admitting: Emergency Medicine

## 2015-03-17 DIAGNOSIS — F10920 Alcohol use, unspecified with intoxication, uncomplicated: Secondary | ICD-10-CM | POA: Insufficient documentation

## 2015-03-17 DIAGNOSIS — Z8719 Personal history of other diseases of the digestive system: Secondary | ICD-10-CM | POA: Insufficient documentation

## 2015-03-17 DIAGNOSIS — R1013 Epigastric pain: Secondary | ICD-10-CM | POA: Insufficient documentation

## 2015-03-17 DIAGNOSIS — R1033 Periumbilical pain: Secondary | ICD-10-CM | POA: Diagnosis not present

## 2015-03-17 DIAGNOSIS — Z7289 Other problems related to lifestyle: Secondary | ICD-10-CM

## 2015-03-17 DIAGNOSIS — R197 Diarrhea, unspecified: Secondary | ICD-10-CM | POA: Insufficient documentation

## 2015-03-17 DIAGNOSIS — R109 Unspecified abdominal pain: Secondary | ICD-10-CM

## 2015-03-17 DIAGNOSIS — Z720 Tobacco use: Secondary | ICD-10-CM | POA: Insufficient documentation

## 2015-03-17 DIAGNOSIS — Z789 Other specified health status: Secondary | ICD-10-CM

## 2015-03-17 DIAGNOSIS — R112 Nausea with vomiting, unspecified: Secondary | ICD-10-CM | POA: Insufficient documentation

## 2015-03-17 LAB — CBC WITH DIFFERENTIAL/PLATELET
Basophils Absolute: 0 10*3/uL (ref 0.0–0.1)
Basophils Relative: 0 % (ref 0–1)
Eosinophils Absolute: 0.1 10*3/uL (ref 0.0–0.7)
Eosinophils Relative: 2 % (ref 0–5)
HEMATOCRIT: 27.9 % — AB (ref 36.0–46.0)
HEMOGLOBIN: 9.1 g/dL — AB (ref 12.0–15.0)
LYMPHS PCT: 50 % — AB (ref 12–46)
Lymphs Abs: 2.4 10*3/uL (ref 0.7–4.0)
MCH: 26 pg (ref 26.0–34.0)
MCHC: 32.6 g/dL (ref 30.0–36.0)
MCV: 79.7 fL (ref 78.0–100.0)
Monocytes Absolute: 0.4 10*3/uL (ref 0.1–1.0)
Monocytes Relative: 8 % (ref 3–12)
NEUTROS ABS: 1.9 10*3/uL (ref 1.7–7.7)
Neutrophils Relative %: 40 % — ABNORMAL LOW (ref 43–77)
Platelets: 339 10*3/uL (ref 150–400)
RBC: 3.5 MIL/uL — AB (ref 3.87–5.11)
RDW: 18.9 % — ABNORMAL HIGH (ref 11.5–15.5)
WBC: 4.8 10*3/uL (ref 4.0–10.5)

## 2015-03-17 LAB — URINALYSIS, ROUTINE W REFLEX MICROSCOPIC
Bilirubin Urine: NEGATIVE
Glucose, UA: NEGATIVE mg/dL
Hgb urine dipstick: NEGATIVE
Ketones, ur: NEGATIVE mg/dL
Leukocytes, UA: NEGATIVE
Nitrite: NEGATIVE
Protein, ur: NEGATIVE mg/dL
Specific Gravity, Urine: 1.021 (ref 1.005–1.030)
Urobilinogen, UA: 0.2 mg/dL (ref 0.0–1.0)
pH: 6 (ref 5.0–8.0)

## 2015-03-17 LAB — COMPREHENSIVE METABOLIC PANEL
ALBUMIN: 3.8 g/dL (ref 3.5–5.0)
ALT: 11 U/L — ABNORMAL LOW (ref 14–54)
AST: 20 U/L (ref 15–41)
Alkaline Phosphatase: 63 U/L (ref 38–126)
Anion gap: 9 (ref 5–15)
BILIRUBIN TOTAL: 0.3 mg/dL (ref 0.3–1.2)
BUN: 6 mg/dL (ref 6–20)
CALCIUM: 8.9 mg/dL (ref 8.9–10.3)
CO2: 22 mmol/L (ref 22–32)
Chloride: 107 mmol/L (ref 101–111)
Creatinine, Ser: 0.56 mg/dL (ref 0.44–1.00)
GFR calc Af Amer: >60 mL/min (ref >60)
GFR calc non Af Amer: >60 mL/min (ref >60)
GLUCOSE: 81 mg/dL (ref 65–99)
Potassium: 3.9 mmol/L (ref 3.5–5.1)
Sodium: 138 mmol/L (ref 135–145)
TOTAL PROTEIN: 6.3 g/dL — AB (ref 6.5–8.1)

## 2015-03-17 LAB — LIPASE, BLOOD: Lipase: 27 U/L (ref 22–51)

## 2015-03-17 LAB — PREGNANCY, URINE: Preg Test, Ur: NEGATIVE

## 2015-03-17 LAB — ETHANOL: Alcohol, Ethyl (B): 143 mg/dL — ABNORMAL HIGH (ref <5)

## 2015-03-17 MED ORDER — ONDANSETRON 4 MG PO TBDP: 4.0000 mg | ORAL_TABLET | Freq: Three times a day (TID) | ORAL | Status: DC | PRN

## 2015-03-17 MED ORDER — ONDANSETRON HCL 4 MG/2ML IJ SOLN
4.0000 mg | Freq: Once | INTRAMUSCULAR | Status: AC
Start: 2015-03-17 — End: 2015-03-17
  Administered 2015-03-17: 4 mg via INTRAVENOUS
  Filled 2015-03-17: qty 2

## 2015-03-17 MED ORDER — MORPHINE SULFATE (PF) 4 MG/ML IV SOLN
4.0000 mg | Freq: Once | INTRAVENOUS | Status: AC
  Administered 2015-03-17: 4 mg via INTRAVENOUS
  Filled 2015-03-17: qty 1

## 2015-03-17 NOTE — Discharge Instructions (Signed)
Please follow up with your primary care physician in 1-2 days. If you do not have one please call the [place] [facility] number listed above. Please read all discharge instructions and return precautions.  ° ° °Abdominal Pain °Many things can cause abdominal pain. Usually, abdominal pain is not caused by a disease and will improve without treatment. It can often be observed and treated at home. Your health care provider will do a physical exam and possibly order blood tests and X-rays to help determine the seriousness of your pain. However, in many cases, more time must pass before a clear cause of the pain can be found. Before that point, your health care provider may not know if you need more testing or further treatment. °HOME CARE INSTRUCTIONS  °Monitor your abdominal pain for any changes. The following actions may help to alleviate any discomfort you are experiencing: °· Only take over-the-counter or prescription medicines as directed by your health care provider. °· Do not take laxatives unless directed to do so by your health care provider. °· Try a clear liquid diet (broth, tea, or water) as directed by your health care provider. Slowly move to a bland diet as tolerated. °SEEK MEDICAL CARE IF: °· You have unexplained abdominal pain. °· You have abdominal pain associated with nausea or diarrhea. °· You have pain when you urinate or have a bowel movement. °· You experience abdominal pain that wakes you in the night. °· You have abdominal pain that is worsened or improved by eating food. °· You have abdominal pain that is worsened with eating fatty foods. °· You have a fever. °SEEK IMMEDIATE MEDICAL CARE IF:  °· Your pain does not go away within 2 hours. °· You keep throwing up (vomiting). °· Your pain is felt only in portions of the abdomen, such as the right side or the left lower portion of the abdomen. °· You pass bloody or black tarry stools. °MAKE SURE YOU: °· Understand these instructions.    °· Will watch your condition.   °· Will get help right away if you are not doing well or get worse.   °Document Released: 04/18/2005 Document Revised: 07/14/2013 Document Reviewed: 03/18/2013 °ExitCare® Patient Information ©2015 [employer]. This information is not intended to replace advice given to you by your health care provider. Make sure you discuss any questions you have with your health care provider. ° ° °

## 2015-03-17 NOTE — ED Notes (Signed)
Pt stable, ambulatory, states understanding of discharge instructions 

## 2015-03-17 NOTE — ED Notes (Signed)
Pt arrived via EMS c/o lower mid abdominal pain intermittently x2 days.  Hx pancreatitis

## 2015-03-17 NOTE — ED Provider Notes (Signed)
CSN: [id number]     Arrival date & time 03/17/15  4098 History   First MD Initiated Contact with Patient 03/17/15 [phone]     Chief Complaint  Patient presents with  . Abdominal Pain     (Consider location/radiation/quality/duration/timing/severity/associated sxs/prior Treatment) HPI Comments: Patient is a 34 yo F PMHx significant for chronic pancreatitis, alcoholism presenting to the ED for two days of epigastric abdominal pain with associated nausea and non-bloody non-bilious emesis. Endorses this feels similar to pancreatitis. Denies any modifying factors. Tried Ibuprofen yesterday with no relief in symptoms. Endorses "drinking a little bit."   Patient is a 34 y.o. female presenting with abdominal pain. The history is provided by the patient.  Abdominal Pain Pain location:  Epigastric Pain severity:  Severe Duration:  2 days Progression:  Worsening Chronicity:  Recurrent Relieved by:  None tried Ineffective treatments:  None tried Associated symptoms: diarrhea, nausea and vomiting     Past Medical History  Diagnosis Date  . Pancreatitis, chronic   . Alcoholism    Past Surgical History  Procedure Laterality Date  . Tubal ligation     History reviewed. No pertinent family history. Social History  Substance Use Topics  . Smoking status: Current Every Day Smoker -- 1.00 packs/day    Types: Cigarettes  . Smokeless tobacco: Never Used  . Alcohol Use: Yes     Comment: occasional   OB History    Gravida Para Term Preterm AB TAB SAB Ectopic Multiple Living   4 3 3  1  1   3      Review of Systems  Gastrointestinal: Positive for nausea, vomiting, abdominal pain and diarrhea.  All other systems reviewed and are negative.     Allergies  Review of patient's allergies indicates no known allergies.  Home Medications   Prior to Admission medications   Medication Sig Start Date End Date Taking? Authorizing Provider  HYDROcodone-acetaminophen (NORCO/VICODIN) 5-325 MG per  tablet Take 1 tablet by mouth every 4 (four) hours as needed for moderate pain or severe pain. Patient not taking: Reported on 03/17/2015 12/29/14   Erasmo Downer, MD  ibuprofen (ADVIL,MOTRIN) 200 MG tablet Take 200 mg by mouth every 6 (six) hours as needed for moderate pain.    Historical Provider, MD  omeprazole (PRILOSEC) 20 MG capsule Take 1 capsule (20 mg total) by mouth daily. Patient not taking: Reported on 12/19/2014 10/02/13   Brandt Loosen, MD  ondansetron (ZOFRAN ODT) 4 MG disintegrating tablet Take 1 tablet (4 mg total) by mouth every 8 (eight) hours as needed for nausea. Patient not taking: Reported on 03/17/2015 12/19/14   Rolland Porter, MD  ondansetron (ZOFRAN ODT) 4 MG disintegrating tablet Take 1 tablet (4 mg total) by mouth every 8 (eight) hours as needed for nausea or vomiting. 03/17/15   Francee Piccolo, PA-C  ondansetron (ZOFRAN) 4 MG tablet Take 1 tablet (4 mg total) by mouth every 6 (six) hours. Patient not taking: Reported on 12/19/2014 10/02/13   Brandt Loosen, MD   BP 108/68 mmHg  Pulse 65  Temp(Src) 97.9 F (36.6 C) (Oral)  Resp 16  SpO2 100% Physical Exam  Constitutional: She is oriented to person, place, and time. She appears well-developed and well-nourished.  HENT:  Head: Normocephalic and atraumatic.  Right Ear: External ear normal.  Left Ear: External ear normal.  Mouth/Throat: No oropharyngeal exudate.  Eyes: Conjunctivae are normal.  Neck: Neck supple.  Cardiovascular: Normal rate, regular rhythm and normal heart sounds.   Pulmonary/Chest: Effort  normal and breath sounds normal.  Abdominal: Soft. Bowel sounds are normal. There is tenderness in the epigastric area and periumbilical area.  Musculoskeletal: Normal range of motion.  Neurological: She is alert and oriented to person, place, and time.  Skin: Skin is warm and dry. She is not diaphoretic.  Nursing note and vitals reviewed.   ED Course  Procedures (including critical care time) Medications   morphine 4 MG/ML injection 4 mg (4 mg Intravenous Given 03/17/15 0435)  ondansetron (ZOFRAN) injection 4 mg (4 mg Intravenous Given 03/17/15 0434)    Labs Review Labs Reviewed  CBC WITH DIFFERENTIAL/PLATELET - Abnormal; Notable for the following:    RBC 3.50 (*)    Hemoglobin 9.1 (*)    HCT 27.9 (*)    RDW 18.9 (*)    Neutrophils Relative % 40 (*)    Lymphocytes Relative 50 (*)    All other components within normal limits  COMPREHENSIVE METABOLIC PANEL - Abnormal; Notable for the following:    Total Protein 6.3 (*)    ALT 11 (*)    All other components within normal limits  URINALYSIS, ROUTINE W REFLEX MICROSCOPIC (NOT AT [facility]) - Abnormal; Notable for the following:    APPearance CLOUDY (*)    All other components within normal limits  ETHANOL - Abnormal; Notable for the following:    Alcohol, Ethyl (B) 143 (*)    All other components within normal limits  LIPASE, BLOOD  PREGNANCY, URINE    Imaging Review No results found. I have personally reviewed and evaluated these images and lab results as part of my medical decision-making.   EKG Interpretation None      5:47 AM On re-evaluation patient asleep. Upon awakening, abdominal pain is improved. Tolerating PO intake w/o difficulty. Complaining of dental pain. No further episodes of emesis or diarrhea.   MDM   Final diagnoses:  Nausea vomiting and diarrhea  Abdominal pain in female  Alcohol use    Filed Vitals:   03/17/15 0552  BP: 108/68  Pulse: 65  Temp:   Resp: 16   Afebrile, NAD, non-toxic appearing, AAOx4.   I have reviewed nursing notes, vital signs, and all lab results as noted above.  Patient is nontoxic, nonseptic appearing, in no apparent distress.  Patient's pain and other symptoms adequately managed in emergency department.  Fluid bolus given.  Labs and vitals reviewed.  Patient does not meet the SIRS or Sepsis criteria.  On repeat exam patient does not have a surgical abdomin and there are no  peritoneal signs.  No indication of appendicitis, bowel obstruction, bowel perforation, cholecystitis, diverticulitis, pancreatitis. PID or ectopic pregnancy.  Patient discharged home with symptomatic treatment and given strict instructions for follow-up with their primary care physician.  I have also discussed reasons to return immediately to the ER.  Patient expresses understanding and agrees with plan.     Francee Piccolo, PA-C 03/17/15 1610  Marisa Severin, MD 03/17/15 [phone]

## 2015-04-07 ENCOUNTER — Encounter (HOSPITAL_COMMUNITY): Payer: Self-pay | Admitting: Emergency Medicine

## 2015-04-07 ENCOUNTER — Inpatient Hospital Stay (HOSPITAL_COMMUNITY)
Admission: EM | Admit: 2015-04-07 | Discharge: 2015-04-12 | DRG: 440 | Disposition: A | Discharge status: Home / Self Care | Payer: Medicaid Other | Attending: Internal Medicine | Admitting: Internal Medicine

## 2015-04-07 DIAGNOSIS — D649 Anemia, unspecified: Secondary | ICD-10-CM | POA: Diagnosis present

## 2015-04-07 DIAGNOSIS — K859 Acute pancreatitis without necrosis or infection, unspecified: Secondary | ICD-10-CM | POA: Diagnosis present

## 2015-04-07 DIAGNOSIS — Z791 Long term (current) use of non-steroidal anti-inflammatories (NSAID): Secondary | ICD-10-CM

## 2015-04-07 DIAGNOSIS — F1010 Alcohol abuse, uncomplicated: Secondary | ICD-10-CM | POA: Diagnosis present

## 2015-04-07 DIAGNOSIS — K861 Other chronic pancreatitis: Secondary | ICD-10-CM | POA: Diagnosis present

## 2015-04-07 DIAGNOSIS — F17210 Nicotine dependence, cigarettes, uncomplicated: Secondary | ICD-10-CM | POA: Diagnosis present

## 2015-04-07 DIAGNOSIS — R109 Unspecified abdominal pain: Secondary | ICD-10-CM

## 2015-04-07 DIAGNOSIS — K852 Alcohol induced acute pancreatitis: Principal | ICD-10-CM | POA: Diagnosis present

## 2015-04-07 DIAGNOSIS — R748 Abnormal levels of other serum enzymes: Secondary | ICD-10-CM | POA: Diagnosis present

## 2015-04-07 NOTE — ED Notes (Signed)
Pt c/o pain right above the umbilicus x1 day, 3 episodes of emesis. Denies diarrhea. Hx of pancreatitis.

## 2015-04-08 ENCOUNTER — Inpatient Hospital Stay (HOSPITAL_COMMUNITY): Payer: Medicaid Other

## 2015-04-08 ENCOUNTER — Encounter (HOSPITAL_COMMUNITY): Payer: Self-pay | Admitting: Internal Medicine

## 2015-04-08 ENCOUNTER — Observation Stay (HOSPITAL_COMMUNITY): Payer: Medicaid Other

## 2015-04-08 DIAGNOSIS — R748 Abnormal levels of other serum enzymes: Secondary | ICD-10-CM | POA: Diagnosis present

## 2015-04-08 DIAGNOSIS — F17210 Nicotine dependence, cigarettes, uncomplicated: Secondary | ICD-10-CM | POA: Diagnosis present

## 2015-04-08 DIAGNOSIS — F1010 Alcohol abuse, uncomplicated: Secondary | ICD-10-CM | POA: Diagnosis not present

## 2015-04-08 DIAGNOSIS — Z791 Long term (current) use of non-steroidal anti-inflammatories (NSAID): Secondary | ICD-10-CM | POA: Diagnosis not present

## 2015-04-08 DIAGNOSIS — D649 Anemia, unspecified: Secondary | ICD-10-CM | POA: Diagnosis present

## 2015-04-08 DIAGNOSIS — K852 Alcohol induced acute pancreatitis: Secondary | ICD-10-CM | POA: Diagnosis not present

## 2015-04-08 DIAGNOSIS — K861 Other chronic pancreatitis: Secondary | ICD-10-CM | POA: Diagnosis present

## 2015-04-08 DIAGNOSIS — R1013 Epigastric pain: Secondary | ICD-10-CM | POA: Diagnosis present

## 2015-04-08 LAB — COMPREHENSIVE METABOLIC PANEL
ALK PHOS: 52 U/L (ref 38–126)
ALT: 14 U/L (ref 14–54)
ALT: 10 U/L — AB (ref 14–54)
ANION GAP: 12 (ref 5–15)
AST: 21 U/L (ref 15–41)
AST: 20 U/L (ref 15–41)
Albumin: 4.6 g/dL (ref 3.5–5.0)
Albumin: 3.8 g/dL (ref 3.5–5.0)
Alkaline Phosphatase: 65 U/L (ref 38–126)
Anion gap: 8 (ref 5–15)
BUN: 8 mg/dL (ref 6–20)
BUN: 7 mg/dL (ref 6–20)
CALCIUM: 8.9 mg/dL (ref 8.9–10.3)
CHLORIDE: 103 mmol/L (ref 101–111)
CO2: 21 mmol/L — ABNORMAL LOW (ref 22–32)
CO2: 22 mmol/L (ref 22–32)
CREATININE: 0.53 mg/dL (ref 0.44–1.00)
Calcium: 9.5 mg/dL (ref 8.9–10.3)
Chloride: 105 mmol/L (ref 101–111)
Creatinine, Ser: 0.6 mg/dL (ref 0.44–1.00)
Glucose, Bld: 110 mg/dL — ABNORMAL HIGH (ref 65–99)
Glucose, Bld: 117 mg/dL — ABNORMAL HIGH (ref 65–99)
POTASSIUM: 3.8 mmol/L (ref 3.5–5.1)
Potassium: 3.7 mmol/L (ref 3.5–5.1)
Sodium: 136 mmol/L (ref 135–145)
Sodium: 135 mmol/L (ref 135–145)
TOTAL PROTEIN: 6.6 g/dL (ref 6.5–8.1)
Total Bilirubin: 0.7 mg/dL (ref 0.3–1.2)
Total Bilirubin: 0.8 mg/dL (ref 0.3–1.2)
Total Protein: 7.8 g/dL (ref 6.5–8.1)

## 2015-04-08 LAB — LIPID PANEL
CHOL/HDL RATIO: 1.9 ratio
Cholesterol: 173 mg/dL (ref 0–200)
HDL: 90 mg/dL (ref >40)
LDL Cholesterol: 69 mg/dL (ref 0–99)
TRIGLYCERIDES: 69 mg/dL (ref <150)
VLDL: 14 mg/dL (ref 0–40)

## 2015-04-08 LAB — CBC WITH DIFFERENTIAL/PLATELET
BASOS ABS: 0 10*3/uL (ref 0.0–0.1)
BASOS PCT: 0 %
EOS ABS: 0.1 10*3/uL (ref 0.0–0.7)
Eosinophils Relative: 1 %
HCT: 29.3 % — ABNORMAL LOW (ref 36.0–46.0)
HEMOGLOBIN: 9.4 g/dL — AB (ref 12.0–15.0)
Lymphocytes Relative: 10 %
Lymphs Abs: 0.9 10*3/uL (ref 0.7–4.0)
MCH: 26.9 pg (ref 26.0–34.0)
MCHC: 32.1 g/dL (ref 30.0–36.0)
MCV: 83.7 fL (ref 78.0–100.0)
Monocytes Absolute: 0.9 10*3/uL (ref 0.1–1.0)
Monocytes Relative: 11 %
NEUTROS PCT: 78 %
Neutro Abs: 6.4 10*3/uL (ref 1.7–7.7)
Platelets: 292 10*3/uL (ref 150–400)
RBC: 3.5 MIL/uL — AB (ref 3.87–5.11)
RDW: 18.5 % — ABNORMAL HIGH (ref 11.5–15.5)
WBC: 8.3 10*3/uL (ref 4.0–10.5)

## 2015-04-08 LAB — CBC
HEMATOCRIT: 35.3 % — AB (ref 36.0–46.0)
HEMOGLOBIN: 11.3 g/dL — AB (ref 12.0–15.0)
MCH: 26.7 pg (ref 26.0–34.0)
MCHC: 32 g/dL (ref 30.0–36.0)
MCV: 83.5 fL (ref 78.0–100.0)
Platelets: 339 10*3/uL (ref 150–400)
RBC: 4.23 MIL/uL (ref 3.87–5.11)
RDW: 18.7 % — ABNORMAL HIGH (ref 11.5–15.5)
WBC: 10.9 10*3/uL — AB (ref 4.0–10.5)

## 2015-04-08 LAB — URINALYSIS, ROUTINE W REFLEX MICROSCOPIC
Glucose, UA: NEGATIVE mg/dL
Hgb urine dipstick: NEGATIVE
Ketones, ur: >80 mg/dL — AB
NITRITE: NEGATIVE
PH: 5.5 (ref 5.0–8.0)
Protein, ur: NEGATIVE mg/dL
SPECIFIC GRAVITY, URINE: 1.03 (ref 1.005–1.030)
UROBILINOGEN UA: 0.2 mg/dL (ref 0.0–1.0)

## 2015-04-08 LAB — RAPID URINE DRUG SCREEN, HOSP PERFORMED
Amphetamines: NOT DETECTED
BARBITURATES: NOT DETECTED
BENZODIAZEPINES: NOT DETECTED
Cocaine: NOT DETECTED
Opiates: POSITIVE — AB
TETRAHYDROCANNABINOL: NOT DETECTED

## 2015-04-08 LAB — URINE MICROSCOPIC-ADD ON

## 2015-04-08 LAB — LIPASE, BLOOD: LIPASE: 1671 U/L — AB (ref 22–51)

## 2015-04-08 LAB — ETHANOL

## 2015-04-08 LAB — PREGNANCY, URINE: PREG TEST UR: NEGATIVE

## 2015-04-08 LAB — I-STAT BETA HCG BLOOD, ED (MC, WL, AP ONLY)

## 2015-04-08 MED ORDER — MORPHINE SULFATE (PF) 2 MG/ML IV SOLN
1.0000 mg | Freq: Once | INTRAVENOUS | Status: AC
  Administered 2015-04-08: 1 mg via INTRAVENOUS
  Filled 2015-04-08: qty 1

## 2015-04-08 MED ORDER — LORAZEPAM 2 MG/ML IJ SOLN: 1.0000 mg | Freq: Four times a day (QID) | INTRAMUSCULAR | Status: DC | PRN

## 2015-04-08 MED ORDER — ADULT MULTIVITAMIN W/MINERALS CH
1.0000 | ORAL_TABLET | Freq: Every day | ORAL | Status: DC
  Administered 2015-04-08 – 2015-04-12 (×5): 1 via ORAL
  Filled 2015-04-08 (×5): qty 1

## 2015-04-08 MED ORDER — KETOROLAC TROMETHAMINE 30 MG/ML IJ SOLN
30.0000 mg | Freq: Once | INTRAMUSCULAR | Status: AC
  Administered 2015-04-08: 30 mg via INTRAMUSCULAR
  Filled 2015-04-08: qty 1

## 2015-04-08 MED ORDER — ONDANSETRON HCL 4 MG/2ML IJ SOLN: 4.0000 mg | Freq: Four times a day (QID) | INTRAMUSCULAR | Status: DC | PRN

## 2015-04-08 MED ORDER — LORAZEPAM 2 MG/ML IJ SOLN
0.0000 mg | Freq: Four times a day (QID) | INTRAMUSCULAR | Status: AC
  Administered 2015-04-08: 2 mg via INTRAVENOUS
  Filled 2015-04-08: qty 1

## 2015-04-08 MED ORDER — SODIUM CHLORIDE 0.9 % IV BOLUS (SEPSIS)
1000.0000 mL | Freq: Once | INTRAVENOUS | Status: AC
  Administered 2015-04-08: 1000 mL via INTRAVENOUS

## 2015-04-08 MED ORDER — LORAZEPAM 2 MG/ML IJ SOLN: 0.0000 mg | Freq: Two times a day (BID) | INTRAMUSCULAR | Status: DC

## 2015-04-08 MED ORDER — ACETAMINOPHEN 650 MG RE SUPP: 650.0000 mg | Freq: Four times a day (QID) | RECTAL | Status: DC | PRN

## 2015-04-08 MED ORDER — ENOXAPARIN SODIUM 40 MG/0.4ML ~~LOC~~ SOLN
40.0000 mg | SUBCUTANEOUS | Status: DC
  Administered 2015-04-08 – 2015-04-11 (×4): 40 mg via SUBCUTANEOUS
  Filled 2015-04-08 (×5): qty 0.4

## 2015-04-08 MED ORDER — THIAMINE HCL 100 MG/ML IJ SOLN
100.0000 mg | Freq: Every day | INTRAMUSCULAR | Status: DC
  Filled 2015-04-08 (×4): qty 1

## 2015-04-08 MED ORDER — ONDANSETRON HCL 4 MG/2ML IJ SOLN
4.0000 mg | Freq: Once | INTRAMUSCULAR | Status: AC
  Administered 2015-04-08: 4 mg via INTRAVENOUS
  Filled 2015-04-08: qty 2

## 2015-04-08 MED ORDER — ONDANSETRON HCL 4 MG PO TABS: 4.0000 mg | ORAL_TABLET | Freq: Four times a day (QID) | ORAL | Status: DC | PRN

## 2015-04-08 MED ORDER — SODIUM CHLORIDE 0.9 % IV SOLN
INTRAVENOUS | Status: DC
  Administered 2015-04-08 – 2015-04-09 (×2): via INTRAVENOUS

## 2015-04-08 MED ORDER — VITAMIN B-1 100 MG PO TABS
100.0000 mg | ORAL_TABLET | Freq: Every day | ORAL | Status: DC
  Administered 2015-04-08 – 2015-04-12 (×5): 100 mg via ORAL
  Filled 2015-04-08 (×5): qty 1

## 2015-04-08 MED ORDER — ACETAMINOPHEN 325 MG PO TABS
650.0000 mg | ORAL_TABLET | Freq: Four times a day (QID) | ORAL | Status: DC | PRN
  Administered 2015-04-10 – 2015-04-12 (×2): 650 mg via ORAL
  Filled 2015-04-08 (×2): qty 2

## 2015-04-08 MED ORDER — HYDROMORPHONE HCL 1 MG/ML IJ SOLN
0.5000 mg | INTRAMUSCULAR | Status: DC | PRN
  Administered 2015-04-08 – 2015-04-09 (×5): 0.5 mg via INTRAVENOUS
  Filled 2015-04-08 (×5): qty 1

## 2015-04-08 MED ORDER — FOLIC ACID 1 MG PO TABS
1.0000 mg | ORAL_TABLET | Freq: Every day | ORAL | Status: DC
  Administered 2015-04-08 – 2015-04-12 (×5): 1 mg via ORAL
  Filled 2015-04-08 (×5): qty 1

## 2015-04-08 MED ORDER — LORAZEPAM 1 MG PO TABS: 1.0000 mg | ORAL_TABLET | Freq: Four times a day (QID) | ORAL | Status: DC | PRN

## 2015-04-08 NOTE — ED Notes (Signed)
hospitalist at bedside

## 2015-04-08 NOTE — ED Notes (Signed)
Pt provided urine sample at 0300. Will add-on new orders

## 2015-04-08 NOTE — ED Notes (Signed)
Bed: WA31 Expected date:  Expected time:  Means of arrival:  Comments: Hold for 23 

## 2015-04-08 NOTE — H&P (Signed)
[facility] History and Physical  Kristina Moyer ZOX:[id number] DOB: 1980/08/08 DOA: 04/07/2015  Referring physician: Ms.Gail. PCP: No PCP Per Patient  Specialists: None.  Chief Complaint: Abdominal pain.  HPI: Kristina Moyer is a 34 y.o. female history of alcoholism most recently admitted in May 2016 for acute alcoholic pancreatitis presents to the ER because of worsening pain over the last 24 hours. Pain is mostly in the epigastric area with nausea and vomiting. Also had mild diarrhea. In the ER patient's labs reveal elevated lipase. Patient states her last drink was yesterday. Denies any chest pain or shortness of breath. Denies any fever chills or any recent use of antibiotics. Patient has been admitted for acute pancreatitis.   Review of Systems: As presented in the history of presenting illness, rest negative.  Past Medical History  Diagnosis Date  . Pancreatitis, chronic   . Alcoholism    Past Surgical History  Procedure Laterality Date  . Tubal ligation     Social History:  reports that she has been smoking Cigarettes.  She has been smoking about 1.00 pack per day. She has never used smokeless tobacco. She reports that she drinks alcohol. She reports that she does not use illicit drugs. Where does patient live at home. Can patient participate in ADLs? Yes.  No Known Allergies  Family History: No family history on file. negative for pancreatitis.   Prior to Admission medications   Medication Sig Start Date End Date Taking? Authorizing Provider  ibuprofen (ADVIL,MOTRIN) 200 MG tablet Take 200 mg by mouth every 6 (six) hours as needed for moderate pain.   Yes Historical Provider, MD  HYDROcodone-acetaminophen (NORCO/VICODIN) 5-325 MG per tablet Take 1 tablet by mouth every 4 (four) hours as needed for moderate pain or severe pain. Patient not taking: Reported on 03/17/2015 12/29/14   Erasmo Downer, MD  omeprazole (PRILOSEC) 20 MG capsule Take 1 capsule (20 mg  total) by mouth daily. Patient not taking: Reported on 12/19/2014 10/02/13   Brandt Loosen, MD  ondansetron (ZOFRAN ODT) 4 MG disintegrating tablet Take 1 tablet (4 mg total) by mouth every 8 (eight) hours as needed for nausea. Patient not taking: Reported on 03/17/2015 12/19/14   Rolland Porter, MD  ondansetron (ZOFRAN ODT) 4 MG disintegrating tablet Take 1 tablet (4 mg total) by mouth every 8 (eight) hours as needed for nausea or vomiting. Patient not taking: Reported on 04/08/2015 03/17/15   Francee Piccolo, PA-C  ondansetron (ZOFRAN) 4 MG tablet Take 1 tablet (4 mg total) by mouth every 6 (six) hours. Patient not taking: Reported on 12/19/2014 10/02/13   Brandt Loosen, MD    Physical Exam: Filed Vitals:   04/07/15 2339 04/08/15 0526  BP: 101/68 115/62  Pulse: 87 63  Temp: 97.9 F (36.6 C)   TempSrc: Oral   Resp: 16 14  SpO2: 100% 100%     General:  Moderately built and nourished.  Eyes: Anicteric no pallor.  ENT: No discharge from the ears eyes nose and mouth.  Neck: No mass felt.  Cardiovascular: S1 and S2 heard.  Respiratory: No rhonchi or crepitations.  Abdomen: Mild epigastric tenderness no guarding or rigidity.  Skin: No rash.  Musculoskeletal: No edema.  Psychiatric: Appears normal.  Neurologic: Alert awake oriented to time place and person. Moves all extremities.  Labs on Admission:  Basic Metabolic Panel:  Recent Labs Lab 04/08/15 0104  NA 136  K 3.8  CL 103  CO2 21*  GLUCOSE 110*  BUN 8  CREATININE 0.60  CALCIUM 9.5   Liver Function Tests:  Recent Labs Lab 04/08/15 0104  AST 21  ALT 14  ALKPHOS 65  BILITOT 0.7  PROT 7.8  ALBUMIN 4.6    Recent Labs Lab 04/08/15 0104  LIPASE 1671*   No results for input(s): AMMONIA in the last 168 hours. CBC:  Recent Labs Lab 04/08/15 0104  WBC 10.9*  HGB 11.3*  HCT 35.3*  MCV 83.5  PLT 339   Cardiac Enzymes: No results for input(s): CKTOTAL, CKMB, CKMBINDEX, TROPONINI in the last 168  hours.  BNP (last 3 results) No results for input(s): BNP in the last 8760 hours.  ProBNP (last 3 results) No results for input(s): PROBNP in the last 8760 hours.  CBG: No results for input(s): GLUCAP in the last 168 hours.  Radiological Exams on Admission: Dg Abd Acute W/chest  04/08/2015   CLINICAL DATA:  Nausea with lower abdominal pain.  Elevated lipase.  EXAM: DG ABDOMEN ACUTE W/ 1V CHEST  COMPARISON:  CT 12/23/2014  FINDINGS: The cardiomediastinal contours are normal. The lungs are clear. There is no free intra-abdominal air. No dilated bowel loops to suggest obstruction. Small volume of stool throughout the colon. No radiopaque calculi. No acute osseous abnormalities are seen.  IMPRESSION: Negative abdominal radiographs.  No acute cardiopulmonary disease.   Electronically Signed   By: Rubye Oaks M.D.   On: 04/08/2015 04:58    Assessment/Plan Principal Problem:   Acute pancreatitis   1. Acute alcoholic pancreatitis - Patient on clear liquid diet. Recheck labs including LFTs and lipase. Will check lipid panel specifically for triglycerides and check sonogram of abdomen. Continue with hydration and pain relief medications. 2. Alcohol abuse - advised patient to quit drinking and I have placed patient on CIWA protocol. 3. Chronic anemia - follow CBC.  Pregnancy screen and drug screens are pending.   DVT Prophylaxis Lovenox.  Code Status: Full code.  Family Communication: Discussed with patient.  Disposition Plan: Admit to inpatient.    KAKRAKANDY,ARSHAD N. [facility] Pager [phone].  If 7PM-7AM, please contact night-coverage [email or web address] Password [email or web address] 04/08/2015, 6:09 AM

## 2015-04-08 NOTE — ED Provider Notes (Signed)
CSN: [id number]     Arrival date & time 04/07/15  2253 History   First MD Initiated Contact with Patient 04/08/15 0201     Chief Complaint  Patient presents with  . Abdominal Pain     (Consider location/radiation/quality/duration/timing/severity/associated sxs/prior Treatment) HPI Comments: This is a 34 year old alcoholic with a history of pancreatitis.  He says that this today.  She's had pain, nausea, several episodes of emesis.  No diarrhea.  He was last seen August 25, not hospitalized  Patient is a 34 y.o. female presenting with abdominal pain. The history is provided by the patient.  Abdominal Pain Pain location:  Epigastric and LUQ Pain quality: aching   Pain radiates to:  Does not radiate Pain severity:  Moderate Onset quality:  Gradual Duration:  2 days Timing:  Constant Progression:  Worsening Chronicity:  Recurrent Context: alcohol use   Context: not eating and not medication withdrawal   Relieved by:  Nothing Worsened by:  Nothing tried Ineffective treatments:  None tried Associated symptoms: dysuria, nausea and vomiting   Associated symptoms: no chest pain, no cough, no diarrhea and no fever   Risk factors: alcohol abuse   Risk factors: not obese     Past Medical History  Diagnosis Date  . Pancreatitis, chronic   . Alcoholism    Past Surgical History  Procedure Laterality Date  . Tubal ligation     No family history on file. Social History  Substance Use Topics  . Smoking status: Current Every Day Smoker -- 1.00 packs/day    Types: Cigarettes  . Smokeless tobacco: Never Used  . Alcohol Use: Yes     Comment: occasional   OB History    Gravida Para Term Preterm AB TAB SAB Ectopic Multiple Living   Review of Systems  Constitutional: Negative for fever.  Respiratory: Negative for cough.   Cardiovascular: Negative for chest pain.  Gastrointestinal: Positive for nausea, vomiting and abdominal pain. Negative for diarrhea.   Genitourinary: Positive for dysuria.  Skin: Negative for rash.  Neurological: Negative for headaches.  All other systems reviewed and are negative.     Allergies  Review of patient's allergies indicates no known allergies.  Home Medications   Prior to Admission medications   Medication Sig Start Date End Date Taking? Authorizing Provider  ibuprofen (ADVIL,MOTRIN) 200 MG tablet Take 200 mg by mouth every 6 (six) hours as needed for moderate pain.   Yes Historical Provider, MD  HYDROcodone-acetaminophen (NORCO/VICODIN) 5-325 MG per tablet Take 1 tablet by mouth every 4 (four) hours as needed for moderate pain or severe pain. Patient not taking: Reported on 03/17/2015 12/29/14   Erasmo Downer, MD  omeprazole (PRILOSEC) 20 MG capsule Take 1 capsule (20 mg total) by mouth daily. Patient not taking: Reported on 12/19/2014 10/02/13   Brandt Loosen, MD  ondansetron (ZOFRAN ODT) 4 MG disintegrating tablet Take 1 tablet (4 mg total) by mouth every 8 (eight) hours as needed for nausea. Patient not taking: Reported on 03/17/2015 12/19/14   Rolland Porter, MD  ondansetron (ZOFRAN ODT) 4 MG disintegrating tablet Take 1 tablet (4 mg total) by mouth every 8 (eight) hours as needed for nausea or vomiting. Patient not taking: Reported on 04/08/2015 03/17/15   Francee Piccolo, PA-C  ondansetron (ZOFRAN) 4 MG tablet Take 1 tablet (4 mg total) by mouth every 6 (six) hours. Patient not taking: Reported on 12/19/2014 10/02/13   Raynelle Fanning  Lavella Lemons, MD   BP 101/68 mmHg  Pulse 87  Temp(Src) 97.9 F (36.6 C) (Oral)  Resp 16  SpO2 100%  LMP 04/05/2015 Physical Exam  Constitutional: She appears well-developed and well-nourished.  HENT:  Head: Normocephalic.  Eyes: Pupils are equal, round, and reactive to light.  Neck: Normal range of motion.  Cardiovascular: Normal rate and regular rhythm.   Pulmonary/Chest: Effort normal and breath sounds normal.  Abdominal: Soft. Bowel sounds are normal. She exhibits no  distension. There is tenderness in the epigastric area and left upper quadrant.  Musculoskeletal: Normal range of motion.  Neurological: She is alert.  Skin: Skin is warm.  Nursing note and vitals reviewed.   ED Course  Procedures (including critical care time) Labs Review Labs Reviewed  LIPASE, BLOOD - Abnormal; Notable for the following:    Lipase 1671 (*)    All other components within normal limits  COMPREHENSIVE METABOLIC PANEL - Abnormal; Notable for the following:    CO2 21 (*)    Glucose, Bld 110 (*)    All other components within normal limits  CBC - Abnormal; Notable for the following:    WBC 10.9 (*)    Hemoglobin 11.3 (*)    HCT 35.3 (*)    RDW 18.7 (*)    All other components within normal limits  URINALYSIS, ROUTINE W REFLEX MICROSCOPIC (NOT AT [facility]) - Abnormal; Notable for the following:    Color, Urine AMBER (*)    APPearance CLOUDY (*)    Bilirubin Urine SMALL (*)    Ketones, ur >80 (*)    Leukocytes, UA TRACE (*)    All other components within normal limits  URINE MICROSCOPIC-ADD ON - Abnormal; Notable for the following:    Squamous Epithelial / LPF FEW (*)    All other components within normal limits  ETHANOL  I-STAT BETA HCG BLOOD, ED ([facility], [facility], [facility] ONLY)    Imaging Review No results found. I have personally reviewed and evaluated these images and lab results as part of my medical decision-making.   EKG Interpretation None      MDM   Final diagnoses:  Acute pancreatitis, unspecified pancreatitis type         Earley Favor, NP 04/08/15 1610  Tomasita Crumble, MD 04/08/15 [phone]

## 2015-04-08 NOTE — Progress Notes (Signed)
PATIENT DETAILS Name: Kristina Moyer Age: 34 y.o. Sex: female Date of Birth: Jun 01, 1981 Admit Date: 04/07/2015 Admitting Physician No admitting provider for patient encounter. PCP:No PCP Per Patient  Brief narrative:  34 year old female with history of alcohol abuse, prior history of alcoholic pancreatitis presented to the ED with epigastric pain. Found to have significantly elevated lipase, suspected ongoing alcoholic pancreatitis. Admitted for further evaluation and treatment  Subjective: Continues to have epigastric pain. Last drink was yesterday. No nausea vomiting this morning.  Assessment/Plan: Principal Problem: Acute alcoholic pancreatitis: Continue supportive measures, abdomen is soft with some mild tenderness in the epigastric area. Follow closely.  Active Problems: Alcohol abuse: No evidence of withdrawal at present-last drink just yesterday. Counseled. Continue Ativan per protocol  Chronic anemia: follow CBC.  Disposition: Remain inpatient  Antimicrobial agents  See below  Anti-infectives    None      DVT Prophylaxis: Prophylactic Lovenox   Code Status: Full code   Family Communication None at bedside  Procedures: None  CONSULTS:  None  Time spent 30 minutes-Greater than 50% of this time was spent in counseling, explanation of diagnosis, planning of further management, and coordination of care.  MEDICATIONS: Scheduled Meds:  Continuous Infusions:  PRN Meds:.HYDROmorphone (DILAUDID) injection    PHYSICAL EXAM: Vital signs in last 24 hours: Filed Vitals:   04/07/15 2339 04/08/15 0526 04/08/15 0724 04/08/15 1046  BP: 101/68 115/62 144/104 120/78  Pulse: 87 63 87 72  Temp: 97.9 F (36.6 C)     TempSrc: Oral     Resp: 16 14 20 14   SpO2: 100% 100% 98% 100%    Weight change:  There were no vitals filed for this visit. There is no weight on file to calculate BMI.   Gen Exam: Awake and alert with clear speech.  Neck:  Supple, No JVD.   Chest: B/L Clear.   CVS: S1 S2 Regular, no murmurs.  Abdomen: soft, BS +, epigastric tenderness, non distended.  Extremities: no edema, lower extremities warm to touch. Neurologic: Non Focal.   Skin: No Rash.   Wounds: N/A.   Intake/Output from previous day: No intake or output data in the 24 hours ending 04/08/15 1347   LAB RESULTS: CBC  Recent Labs Lab 04/08/15 0104  WBC 10.9*  HGB 11.3*  HCT 35.3*  PLT 339  MCV 83.5  MCH 26.7  MCHC 32.0  RDW 18.7*    Chemistries   Recent Labs Lab 04/08/15 0104  NA 136  K 3.8  CL 103  CO2 21*  GLUCOSE 110*  BUN 8  CREATININE 0.60  CALCIUM 9.5    CBG: No results for input(s): GLUCAP in the last 168 hours.  GFR CrCl cannot be calculated (Unknown ideal weight.).  Coagulation profile No results for input(s): INR, PROTIME in the last 168 hours.  Cardiac Enzymes No results for input(s): CKMB, TROPONINI, MYOGLOBIN in the last 168 hours.  Invalid input(s): CK  Invalid input(s): POCBNP No results for input(s): DDIMER in the last 72 hours. No results for input(s): HGBA1C in the last 72 hours. No results for input(s): CHOL, HDL, LDLCALC, TRIG, CHOLHDL, LDLDIRECT in the last 72 hours. No results for input(s): TSH, T4TOTAL, T3FREE, THYROIDAB in the last 72 hours.  Invalid input(s): FREET3 No results for input(s): VITAMINB12, FOLATE, FERRITIN, TIBC, IRON, RETICCTPCT in the last 72 hours.  Recent Labs  04/08/15 0104  LIPASE 1671*    Urine  Studies No results for input(s): UHGB, CRYS in the last 72 hours.  Invalid input(s): UACOL, UAPR, USPG, UPH, UTP, UGL, UKET, UBIL, UNIT, UROB, ULEU, UEPI, UWBC, URBC, UBAC, CAST, UCOM, BILUA  MICROBIOLOGY: No results found for this or any previous visit (from the past 240 hour(s)).  RADIOLOGY STUDIES/RESULTS: Dg Abd Acute W/chest  04/08/2015   CLINICAL DATA:  Nausea with lower abdominal pain.  Elevated lipase.  EXAM: DG ABDOMEN ACUTE W/ 1V CHEST  COMPARISON:  CT  12/23/2014  FINDINGS: The cardiomediastinal contours are normal. The lungs are clear. There is no free intra-abdominal air. No dilated bowel loops to suggest obstruction. Small volume of stool throughout the colon. No radiopaque calculi. No acute osseous abnormalities are seen.  IMPRESSION: Negative abdominal radiographs.  No acute cardiopulmonary disease.   Electronically Signed   By: Rubye Oaks M.D.   On: 04/08/2015 04:58    Jeoffrey Massed, MD  [facility] Pager:[phone]  If 7PM-7AM, please contact night-coverage [email or web address] Password [email or web address] 04/08/2015, 1:47 PM   LOS: 0 days

## 2015-04-08 NOTE — ED Notes (Signed)
US at bedside

## 2015-04-08 NOTE — ED Notes (Signed)
Pt can go up at 17:45 

## 2015-04-09 DIAGNOSIS — F1010 Alcohol abuse, uncomplicated: Secondary | ICD-10-CM

## 2015-04-09 DIAGNOSIS — K852 Alcohol induced acute pancreatitis: Principal | ICD-10-CM

## 2015-04-09 LAB — BASIC METABOLIC PANEL
Anion gap: 6 (ref 5–15)
BUN: <5 mg/dL — ABNORMAL LOW (ref 6–20)
CHLORIDE: 106 mmol/L (ref 101–111)
CO2: 23 mmol/L (ref 22–32)
Calcium: 8.4 mg/dL — ABNORMAL LOW (ref 8.9–10.3)
Creatinine, Ser: 0.47 mg/dL (ref 0.44–1.00)
Glucose, Bld: 113 mg/dL — ABNORMAL HIGH (ref 65–99)
POTASSIUM: 3.5 mmol/L (ref 3.5–5.1)
SODIUM: 135 mmol/L (ref 135–145)

## 2015-04-09 LAB — CBC
HEMATOCRIT: 26.1 % — AB (ref 36.0–46.0)
Hemoglobin: 8.3 g/dL — ABNORMAL LOW (ref 12.0–15.0)
MCH: 26.6 pg (ref 26.0–34.0)
MCHC: 31.8 g/dL (ref 30.0–36.0)
MCV: 83.7 fL (ref 78.0–100.0)
PLATELETS: 248 10*3/uL (ref 150–400)
RBC: 3.12 MIL/uL — AB (ref 3.87–5.11)
RDW: 18.6 % — ABNORMAL HIGH (ref 11.5–15.5)
WBC: 5.7 10*3/uL (ref 4.0–10.5)

## 2015-04-09 LAB — LIPASE, BLOOD: Lipase: 340 U/L — ABNORMAL HIGH (ref 22–51)

## 2015-04-09 MED ORDER — POTASSIUM CHLORIDE 2 MEQ/ML IV SOLN: INTRAVENOUS | Status: DC

## 2015-04-09 MED ORDER — KCL IN DEXTROSE-NACL 40-5-0.45 MEQ/L-%-% IV SOLN
INTRAVENOUS | Status: DC
  Administered 2015-04-09: 1000 mL via INTRAVENOUS
  Administered 2015-04-09: 09:00:00 via INTRAVENOUS
  Filled 2015-04-09 (×4): qty 1000

## 2015-04-09 MED ORDER — MORPHINE SULFATE (PF) 4 MG/ML IV SOLN
4.0000 mg | INTRAVENOUS | Status: DC | PRN
  Administered 2015-04-09 – 2015-04-10 (×5): 4 mg via INTRAVENOUS
  Filled 2015-04-09 (×5): qty 1

## 2015-04-09 NOTE — Progress Notes (Signed)
[facility] PROGRESS NOTE  Assessment/Plan: Acute Alcoholic pancreatitis: - place pt NPO, cont IV narcotics. - cont to have abd pain.  Alcohol abuse - cont thiamine and folate, cot to monitor with CIWA protocol.   Code Status: full Family Communication: none  Disposition Plan: inpatient home in 2 days   Consultants:  none  Procedures:  none  Antibiotics:  None  HPI/Subjective: Still in pain  Objective: Filed Vitals:   04/08/15 1756 04/08/15 1806 04/09/15 0130 04/09/15 0620  BP: 103/67 111/69 108/59 113/69  Pulse: 71 72 80 79  Temp: 98.9 F (37.2 C) 99.3 F (37.4 C) 98.7 F (37.1 C) 98.9 F (37.2 C)  TempSrc: Oral Oral Oral Oral  Resp: Height:   (1.854 m)    Weight:  54.432 kg (120 lb)    SpO2: 100% 100% 100% 100%    Intake/Output Summary (Last 24 hours) at 04/09/15 0727 Last data filed at 04/09/15 1191  Gross per 24 hour  Intake   1000 ml  Output      0 ml  Net   1000 ml   Filed Weights   04/08/15 1806  Weight: 54.432 kg (120 lb)    Exam:  General: Alert, awake, oriented x3, in no acute distress.  HEENT: No bruits, no goiter.  Heart: Regular rate and rhythm. Lungs: Good air movement, clear Abdomen: Soft, epigastric tenderness,nondistended, positive bowel sounds.  Neuro: Grossly intact, nonfocal.   Data Reviewed: Basic Metabolic Panel:  Recent Labs Lab 04/08/15 0104 04/08/15 1411 04/09/15 0430  NA 136 135 135  K 3.8 3.7 3.5  CL 103 105 106  CO2 21* 22 23  GLUCOSE 110* 117* 113*  BUN 8 7 <5*  CREATININE 0.60 0.53 0.47  CALCIUM 9.5 8.9 8.4*   Liver Function Tests:  Recent Labs Lab 04/08/15 0104 04/08/15 1411  AST 21 20  ALT 14 10*  ALKPHOS 65 52  BILITOT 0.7 0.8  PROT 7.8 6.6  ALBUMIN 4.6 3.8    Recent Labs Lab 04/08/15 0104 04/09/15 0430  LIPASE 1671* 340*   No results for input(s): AMMONIA in the last 168 hours. CBC:  Recent Labs Lab 04/08/15 0104 04/08/15 1411 04/09/15 0430    WBC 10.9* 8.3 5.7  NEUTROABS  --  6.4  --   HGB 11.3* 9.4* 8.3*  HCT 35.3* 29.3* 26.1*  MCV 83.5 83.7 83.7  PLT 339 292 248   Cardiac Enzymes: No results for input(s): CKTOTAL, CKMB, CKMBINDEX, TROPONINI in the last 168 hours. BNP (last 3 results) No results for input(s): BNP in the last 8760 hours.  ProBNP (last 3 results) No results for input(s): PROBNP in the last 8760 hours.  CBG: No results for input(s): GLUCAP in the last 168 hours.  No results found for this or any previous visit (from the past 240 hour(s)).   Studies: US Abdomen Complete  04/08/2015   CLINICAL DATA:  Abdominal pain with nausea and vomiting. History of pancreatitis  EXAM: ULTRASOUND ABDOMEN COMPLETE  COMPARISON:  January 12, 2015 CT abdomen and pelvis ; abdominal ultrasound Dec 20, 2014.  FINDINGS: Gallbladder: No gallstones or wall thickening visualized. There is no pericholecystic fluid. No sonographic Murphy sign noted.  Common bile duct: Diameter: 2 mm. There is no intrahepatic, common hepatic, or common bile duct dilatation.  Liver: No focal lesion identified. Within normal limits in parenchymal echogenicity.  IVC: No abnormality visualized.  Pancreas: Pancreas is upper normal in size. There is no peripancreatic  inflammation. No pancreatic mass or pseudocyst appreciable.  Spleen: Size and appearance within normal limits.  Right Kidney: Length: 10.9 cm. Echogenicity within normal limits. No mass or hydronephrosis visualized.  Left Kidney: Length: 11.0 cm. Echogenicity within normal limits. No mass or hydronephrosis visualized.  Abdominal aorta: No aneurysm visualized.  Other findings: No demonstrable ascites.  IMPRESSION: Pancreas upper normal in size without focal inflammation or mass. Study otherwise unremarkable.   Electronically Signed   By: Bretta Bang III M.D.   On: 04/08/2015 14:55   Dg Abd Acute W/chest  04/08/2015   CLINICAL DATA:  Nausea with lower abdominal pain.  Elevated lipase.  EXAM: DG ABDOMEN  ACUTE W/ 1V CHEST  COMPARISON:  CT 12/23/2014  FINDINGS: The cardiomediastinal contours are normal. The lungs are clear. There is no free intra-abdominal air. No dilated bowel loops to suggest obstruction. Small volume of stool throughout the colon. No radiopaque calculi. No acute osseous abnormalities are seen.  IMPRESSION: Negative abdominal radiographs.  No acute cardiopulmonary disease.   Electronically Signed   By: Rubye Oaks M.D.   On: 04/08/2015 04:58    Scheduled Meds: . enoxaparin (LOVENOX) injection  40 mg Subcutaneous Q24H  . folic acid  1 mg Oral Daily  . LORazepam  0-4 mg Intravenous Q6H   Followed by  . [START ON 04/10/2015] LORazepam  0-4 mg Intravenous Q12H  . multivitamin with minerals  1 tablet Oral Daily  . thiamine  100 mg Oral Daily   Or  . thiamine  100 mg Intravenous Daily   Continuous Infusions: . sodium chloride 125 mL/hr at 04/09/15 0210    Time Spent: 25 min.   Marinda Elk  [facility] Pager [phone]. If 7PM-7AM, please contact night-coverage at [email or web address], password [facility] 04/09/2015, 7:27 AM  LOS: 1 day

## 2015-04-10 MED ORDER — HYDROXYZINE HCL 10 MG PO TABS
10.0000 mg | ORAL_TABLET | Freq: Three times a day (TID) | ORAL | Status: DC | PRN
  Administered 2015-04-10 – 2015-04-12 (×2): 10 mg via ORAL
  Filled 2015-04-10 (×3): qty 1

## 2015-04-10 MED ORDER — HYOSCYAMINE SULFATE 0.125 MG SL SUBL
0.2500 mg | SUBLINGUAL_TABLET | Freq: Once | SUBLINGUAL | Status: AC
  Administered 2015-04-10: 0.25 mg via SUBLINGUAL
  Filled 2015-04-10: qty 2

## 2015-04-10 NOTE — Progress Notes (Signed)
[facility] PROGRESS NOTE  Assessment/Plan: Acute Alcoholic pancreatitis: - pain improved, will like to try a diet. - start clear liq diet. D/c nartotics.  Alcohol abuse - cont thiamine and folate, cot to monitor with CIWA protocol.   Code Status: full Family Communication: none  Disposition Plan: inpatient home in 2 days   Consultants:  none  Procedures:  none  Antibiotics:  None  HPI/Subjective: Pain improved, will like to try a diet.  Objective: Filed Vitals:   04/09/15 1843 04/09/15 2000 04/10/15 0108 04/10/15 0800  BP: 107/66 109/69 112/74 103/65  Pulse: 66 64 68 58  Temp: 98.6 F (37 C) 98.9 F (37.2 C) 98.7 F (37.1 C) 98.8 F (37.1 C)  TempSrc: Oral Oral Oral Oral  Resp: Height:      Weight:      SpO2: 100% 100% 100% 100%    Intake/Output Summary (Last 24 hours) at 04/10/15 0933 Last data filed at 04/10/15 0700  Gross per 24 hour  Intake   2155 ml  Output      2 ml  Net   2153 ml   Filed Weights   04/08/15 1806  Weight: 54.432 kg (120 lb)    Exam:  General: Alert, awake, oriented x3, in no acute distress.  HEENT: No bruits, no goiter.  Heart: Regular rate and rhythm. Lungs: Good air movement, clear Abdomen: Soft, epigastric tenderness,nondistended, positive bowel sounds.  Neuro: Grossly intact, nonfocal.   Data Reviewed: Basic Metabolic Panel:  Recent Labs Lab 04/08/15 0104 04/08/15 1411 04/09/15 0430  NA 136 135 135  K 3.8 3.7 3.5  CL 103 105 106  CO2 21* 22 23  GLUCOSE 110* 117* 113*  BUN 8 7 <5*  CREATININE 0.60 0.53 0.47  CALCIUM 9.5 8.9 8.4*   Liver Function Tests:  Recent Labs Lab 04/08/15 0104 04/08/15 1411  AST 21 20  ALT 14 10*  ALKPHOS 65 52  BILITOT 0.7 0.8  PROT 7.8 6.6  ALBUMIN 4.6 3.8    Recent Labs Lab 04/08/15 0104 04/09/15 0430  LIPASE 1671* 340*   No results for input(s): AMMONIA in the last 168 hours. CBC:  Recent Labs Lab 04/08/15 0104 04/08/15 1411  04/09/15 0430  WBC 10.9* 8.3 5.7  NEUTROABS  --  6.4  --   HGB 11.3* 9.4* 8.3*  HCT 35.3* 29.3* 26.1*  MCV 83.5 83.7 83.7  PLT 339 292 248   Cardiac Enzymes: No results for input(s): CKTOTAL, CKMB, CKMBINDEX, TROPONINI in the last 168 hours. BNP (last 3 results) No results for input(s): BNP in the last 8760 hours.  ProBNP (last 3 results) No results for input(s): PROBNP in the last 8760 hours.  CBG: No results for input(s): GLUCAP in the last 168 hours.  No results found for this or any previous visit (from the past 240 hour(s)).   Studies: US Abdomen Complete  04/08/2015   CLINICAL DATA:  Abdominal pain with nausea and vomiting. History of pancreatitis  EXAM: ULTRASOUND ABDOMEN COMPLETE  COMPARISON:  January 12, 2015 CT abdomen and pelvis ; abdominal ultrasound Dec 20, 2014.  FINDINGS: Gallbladder: No gallstones or wall thickening visualized. There is no pericholecystic fluid. No sonographic Murphy sign noted.  Common bile duct: Diameter: 2 mm. There is no intrahepatic, common hepatic, or common bile duct dilatation.  Liver: No focal lesion identified. Within normal limits in parenchymal echogenicity.  IVC: No abnormality visualized.  Pancreas: Pancreas is upper normal in size. There is no  peripancreatic inflammation. No pancreatic mass or pseudocyst appreciable.  Spleen: Size and appearance within normal limits.  Right Kidney: Length: 10.9 cm. Echogenicity within normal limits. No mass or hydronephrosis visualized.  Left Kidney: Length: 11.0 cm. Echogenicity within normal limits. No mass or hydronephrosis visualized.  Abdominal aorta: No aneurysm visualized.  Other findings: No demonstrable ascites.  IMPRESSION: Pancreas upper normal in size without focal inflammation or mass. Study otherwise unremarkable.   Electronically Signed   By: Bretta Bang III M.D.   On: 04/08/2015 14:55    Scheduled Meds: . enoxaparin (LOVENOX) injection  40 mg Subcutaneous Q24H  . folic acid  1 mg Oral  Daily  . LORazepam  0-4 mg Intravenous Q6H   Followed by  . LORazepam  0-4 mg Intravenous Q12H  . multivitamin with minerals  1 tablet Oral Daily  . thiamine  100 mg Oral Daily   Or  . thiamine  100 mg Intravenous Daily   Continuous Infusions: . dextrose 5 % and 0.45 % NaCl with KCl 40 mEq/L 1,000 mL (04/09/15 2016)    Time Spent: 25 min.   Marinda Elk  [facility] Pager [phone]. If 7PM-7AM, please contact night-coverage at [email or web address], password [facility] 04/10/2015, 9:33 AM  LOS: 2 days

## 2015-04-11 LAB — BASIC METABOLIC PANEL
Anion gap: 8 (ref 5–15)
BUN: <5 mg/dL — ABNORMAL LOW (ref 6–20)
CO2: 23 mmol/L (ref 22–32)
Calcium: 8.8 mg/dL — ABNORMAL LOW (ref 8.9–10.3)
Chloride: 104 mmol/L (ref 101–111)
Creatinine, Ser: 0.51 mg/dL (ref 0.44–1.00)
GFR calc Af Amer: >60 mL/min (ref >60)
GFR calc non Af Amer: >60 mL/min (ref >60)
Glucose, Bld: 104 mg/dL — ABNORMAL HIGH (ref 65–99)
Potassium: 3.8 mmol/L (ref 3.5–5.1)
Sodium: 135 mmol/L (ref 135–145)

## 2015-04-11 MED ORDER — MORPHINE SULFATE (PF) 4 MG/ML IV SOLN
4.0000 mg | INTRAVENOUS | Status: DC | PRN
  Administered 2015-04-11 – 2015-04-12 (×4): 4 mg via INTRAVENOUS
  Filled 2015-04-11 (×4): qty 1

## 2015-04-11 MED ORDER — HYOSCYAMINE SULFATE 0.125 MG SL SUBL
0.2500 mg | SUBLINGUAL_TABLET | Freq: Four times a day (QID) | SUBLINGUAL | Status: DC | PRN
  Administered 2015-04-11: 0.25 mg via SUBLINGUAL
  Filled 2015-04-11 (×3): qty 2

## 2015-04-11 MED ORDER — DEXTROSE-NACL 5-0.45 % IV SOLN
INTRAVENOUS | Status: DC
  Administered 2015-04-11: 21:00:00 via INTRAVENOUS
  Administered 2015-04-11: 1000 mL via INTRAVENOUS
  Administered 2015-04-12: 04:00:00 via INTRAVENOUS

## 2015-04-11 MED ORDER — PANTOPRAZOLE SODIUM 40 MG IV SOLR
40.0000 mg | Freq: Two times a day (BID) | INTRAVENOUS | Status: DC
  Administered 2015-04-11 – 2015-04-12 (×3): 40 mg via INTRAVENOUS
  Filled 2015-04-11 (×4): qty 40

## 2015-04-11 NOTE — Care Management Note (Signed)
Case Management Note  Patient Details  Name: Kristina Moyer MRN: [id number] Date of Birth: [birth year]  Subjective/Objective:  34 y/o f admitted w/Acute pancreatitis. From home. Indep PTA.                  Action/Plan:d/c plan home. No anticipated d/c needs.   Expected Discharge Date:   (UNKNOWN)               Expected Discharge Plan:  Home/Self Care  In-House Referral:     Discharge planning Services  CM Consult  Post Acute Care Choice:    Choice offered to:     DME Arranged:    DME Agency:     HH Arranged:    HH Agency:     Status of Service:  In process, will continue to follow  Medicare Important Message Given:    Date Medicare IM Given:    Medicare IM give by:    Date Additional Medicare IM Given:    Additional Medicare Important Message give by:     If discussed at Long Length of Stay Meetings, dates discussed:    Additional Comments:  Lanier Clam, RN 04/11/2015, 12:22 PM

## 2015-04-11 NOTE — Progress Notes (Signed)
[facility] PROGRESS NOTE  Assessment/Plan: Acute Alcoholic pancreatitis: - We try to give her a diet yesterday and it made her pain worst. Will place nothing by mouth start on IV morphine.  Alcohol abuse - cont thiamine and folate, cot to monitor with CIWA protocol.   Code Status: full Family Communication: none  Disposition Plan: inpatient home in 2 days   Consultants:  none  Procedures:  none  Antibiotics:  None  HPI/Subjective: Pain was worst when she try to eat. She relates it radiated to her back.  Objective: Filed Vitals:   04/10/15 1400 04/10/15 2154 04/11/15 0151 04/11/15 0505  BP: 101/57 100/64 106/54 101/62  Pulse: 60 66 66 66  Temp: 98.6 F (37 C) 99.1 F (37.3 C) 98.7 F (37.1 C) 98.4 F (36.9 C)  TempSrc: Oral Oral Oral Oral  Resp: Height:      Weight:      SpO2: 100% 100% 100% 100%    Intake/Output Summary (Last 24 hours) at 04/11/15 0855 Last data filed at 04/11/15 0600  Gross per 24 hour  Intake 1588.33 ml  Output      0 ml  Net 1588.33 ml   Filed Weights   04/08/15 1806  Weight: 54.432 kg (120 lb)    Exam:  General: Alert, awake, oriented x3, in no acute distress.  HEENT: No bruits, no goiter.  Heart: Regular rate and rhythm. Lungs: Good air movement, clear, no rails Abdomen: Soft, persistent epigastric tenderness,nondistended, positive bowel sounds.     Data Reviewed: Basic Metabolic Panel:  Recent Labs Lab 04/08/15 0104 04/08/15 1411 04/09/15 0430 04/11/15 0444  NA 136 135 135 135  K 3.8 3.7 3.5 3.8  CL 103 105 106 104  CO2 21* GLUCOSE 110* 117* 113* 104*  BUN 8 7 <5* <5*  CREATININE 0.60 0.53 0.47 0.51  CALCIUM 9.5 8.9 8.4* 8.8*   Liver Function Tests:  Recent Labs Lab 04/08/15 0104 04/08/15 1411  AST 21 20  ALT 14 10*  ALKPHOS 65 52  BILITOT 0.7 0.8  PROT 7.8 6.6  ALBUMIN 4.6 3.8    Recent Labs Lab 04/08/15 0104 04/09/15 0430  LIPASE 1671* 340*   No  results for input(s): AMMONIA in the last 168 hours. CBC:  Recent Labs Lab 04/08/15 0104 04/08/15 1411 04/09/15 0430  WBC 10.9* 8.3 5.7  NEUTROABS  --  6.4  --   HGB 11.3* 9.4* 8.3*  HCT 35.3* 29.3* 26.1*  MCV 83.5 83.7 83.7  PLT 339 292 248   Cardiac Enzymes: No results for input(s): CKTOTAL, CKMB, CKMBINDEX, TROPONINI in the last 168 hours. BNP (last 3 results) No results for input(s): BNP in the last 8760 hours.  ProBNP (last 3 results) No results for input(s): PROBNP in the last 8760 hours.  CBG: No results for input(s): GLUCAP in the last 168 hours.  No results found for this or any previous visit (from the past 240 hour(s)).   Studies: No results found.  Scheduled Meds: . enoxaparin (LOVENOX) injection  40 mg Subcutaneous Q24H  . folic acid  1 mg Oral Daily  . LORazepam  0-4 mg Intravenous Q12H  . multivitamin with minerals  1 tablet Oral Daily  . thiamine  100 mg Oral Daily   Or  . thiamine  100 mg Intravenous Daily   Continuous Infusions: . dextrose 5 % and 0.45% NaCl 1,000 mL (04/11/15 0355)    Time Spent: 25 min.  Marinda Elk  [facility] Pager [phone]. If 7PM-7AM, please contact night-coverage at [email or web address], password [facility] 04/11/2015, 8:55 AM  LOS: 3 days

## 2015-04-11 NOTE — Progress Notes (Signed)
Patient c/o severe abdominal pain 9/10 after eating a chocolate chip cookie from meal tray and states no relief after taking tylenol.  Notified Kirtland Bouchard Schorr NP, orders received. Due to severe pain, patient also made NPO at this time. Discussed diet change with patient and patient agrees with plan of care. Will continue to monitor closely.  Dorothyann Peng RN

## 2015-04-12 NOTE — Discharge Summary (Signed)
Physician Discharge Summary  Kristina Moyer UEA:[id number] DOB: 10-03-1980 DOA: 04/07/2015  PCP: No PCP Per Patient  Admit date: 04/07/2015 Discharge date: 04/12/2015  Time spent: 35 minutes  Recommendations for Outpatient Follow-up:  1. Follow-up with primary care doctor at the Center as an outpatient.  Discharge Diagnoses:  Principal Problem:   Acute pancreatitis Active Problems:   Alcohol abuse   Discharge Condition: Stable  Diet recommendation: regular  Filed Weights   04/08/15 1806  Weight: 54.432 kg (120 lb)    History of present illness:  34 year old female recently admitted for alcoholic pancreatitis in [place] comes in to the ED because of worsening abdominal pain for the last 24 hours radiating to her back associated with nausea and vomiting.  Hospital Course:  Acute alcoholic pancreatitis: She was placed nothing by mouth on IV fluids and IV narcotics treated conservatively it took several days to improve. She was tried on clear liquid diet which she tolerated narcotics were stopped she was started on Protonix by mouth twice a day she was counseled about staining from alcohol.   Alcohol abuse: She was started on thiamine and folate was monitor with protocol signs of withdrawal.   Procedures:  None (i.e. Studies not automatically included, echos, thoracentesis, etc; not x-rays)  Consultations:  None  Discharge Exam: Filed Vitals:   04/12/15 0417  BP: 126/70  Pulse: 50  Temp: 97.5 F (36.4 C)  Resp: 12    General: Awake alert oriented 3 Cardiovascular: Regular rate and rhythm Respiratory: Air movement clear to auscultation  Discharge Instructions   Discharge Instructions    Diet - low sodium heart healthy    Complete by:  As directed      Increase activity slowly    Complete by:  As directed           Current Discharge Medication List    CONTINUE these medications which have NOT CHANGED   Details  HYDROcodone-acetaminophen  (NORCO/VICODIN) 5-325 MG per tablet Take 1 tablet by mouth every 4 (four) hours as needed for moderate pain or severe pain. Qty: 30 tablet, Refills: 0    omeprazole (PRILOSEC) 20 MG capsule Take 1 capsule (20 mg total) by mouth daily. Qty: 30 capsule, Refills: 0    ondansetron (ZOFRAN) 4 MG tablet Take 1 tablet (4 mg total) by mouth every 6 (six) hours. Qty: 18 tablet, Refills: 0      STOP taking these medications     ibuprofen (ADVIL,MOTRIN) 200 MG tablet      ondansetron (ZOFRAN ODT) 4 MG disintegrating tablet      ondansetron (ZOFRAN ODT) 4 MG disintegrating tablet        No Known Allergies    The results of significant diagnostics from this hospitalization (including imaging, microbiology, ancillary and laboratory) are listed below for reference.    Significant Diagnostic Studies: US Abdomen Complete  04/08/2015   CLINICAL DATA:  Abdominal pain with nausea and vomiting. History of pancreatitis  EXAM: ULTRASOUND ABDOMEN COMPLETE  COMPARISON:  January 12, 2015 CT abdomen and pelvis ; abdominal ultrasound Dec 20, 2014.  FINDINGS: Gallbladder: No gallstones or wall thickening visualized. There is no pericholecystic fluid. No sonographic Murphy sign noted.  Common bile duct: Diameter: 2 mm. There is no intrahepatic, common hepatic, or common bile duct dilatation.  Liver: No focal lesion identified. Within normal limits in parenchymal echogenicity.  IVC: No abnormality visualized.  Pancreas: Pancreas is upper normal in size. There is no peripancreatic inflammation. No pancreatic mass or  pseudocyst appreciable.  Spleen: Size and appearance within normal limits.  Right Kidney: Length: 10.9 cm. Echogenicity within normal limits. No mass or hydronephrosis visualized.  Left Kidney: Length: 11.0 cm. Echogenicity within normal limits. No mass or hydronephrosis visualized.  Abdominal aorta: No aneurysm visualized.  Other findings: No demonstrable ascites.  IMPRESSION: Pancreas upper normal in size  without focal inflammation or mass. Study otherwise unremarkable.   Electronically Signed   By: Bretta Bang III M.D.   On: 04/08/2015 14:55   Dg Abd Acute W/chest  04/08/2015   CLINICAL DATA:  Nausea with lower abdominal pain.  Elevated lipase.  EXAM: DG ABDOMEN ACUTE W/ 1V CHEST  COMPARISON:  CT 12/23/2014  FINDINGS: The cardiomediastinal contours are normal. The lungs are clear. There is no free intra-abdominal air. No dilated bowel loops to suggest obstruction. Small volume of stool throughout the colon. No radiopaque calculi. No acute osseous abnormalities are seen.  IMPRESSION: Negative abdominal radiographs.  No acute cardiopulmonary disease.   Electronically Signed   By: Rubye Oaks M.D.   On: 04/08/2015 04:58    Microbiology: No results found for this or any previous visit (from the past 240 hour(s)).   Labs: Basic Metabolic Panel:  Recent Labs Lab 04/08/15 0104 04/08/15 1411 04/09/15 0430 04/11/15 0444  NA 136 135 135 135  K 3.8 3.7 3.5 3.8  CL 103 105 106 104  CO2 21* GLUCOSE 110* 117* 113* 104*  BUN 8 7 <5* <5*  CREATININE 0.60 0.53 0.47 0.51  CALCIUM 9.5 8.9 8.4* 8.8*   Liver Function Tests:  Recent Labs Lab 04/08/15 0104 04/08/15 1411  AST 21 20  ALT 14 10*  ALKPHOS 65 52  BILITOT 0.7 0.8  PROT 7.8 6.6  ALBUMIN 4.6 3.8    Recent Labs Lab 04/08/15 0104 04/09/15 0430  LIPASE 1671* 340*   No results for input(s): AMMONIA in the last 168 hours. CBC:  Recent Labs Lab 04/08/15 0104 04/08/15 1411 04/09/15 0430  WBC 10.9* 8.3 5.7  NEUTROABS  --  6.4  --   HGB 11.3* 9.4* 8.3*  HCT 35.3* 29.3* 26.1*  MCV 83.5 83.7 83.7  PLT 339 292 248   Cardiac Enzymes: No results for input(s): CKTOTAL, CKMB, CKMBINDEX, TROPONINI in the last 168 hours. BNP: BNP (last 3 results) No results for input(s): BNP in the last 8760 hours.  ProBNP (last 3 results) No results for input(s): PROBNP in the last 8760 hours.  CBG: No results for  input(s): GLUCAP in the last 168 hours.     Signed:  Marinda Elk  [facility] 04/12/2015, 8:33 AM

## 2017-05-13 IMAGING — US US ABDOMEN COMPLETE
1 series · 14 of 25 positions shown · non-contrast
Comparison: January 12, 2015 CT abdomen and pelvis ; abdominal
ultrasound December 20, 2014.

CLINICAL DATA: Abdominal pain with nausea and vomiting. History of
pancreatitis

EXAM:
ULTRASOUND ABDOMEN COMPLETE

[Series 1: us abdomen complete · 0.12mm/px · 14 of 81 slices shown]
[im 1/81]
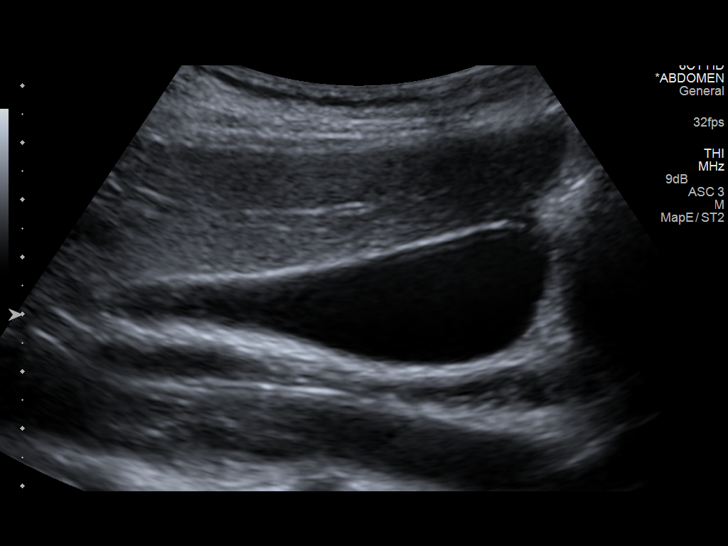
[im 7/81]
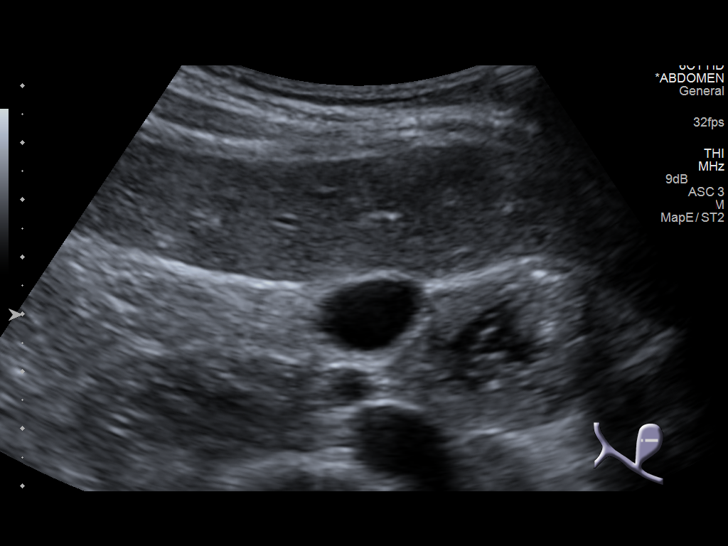
[im 14/81]
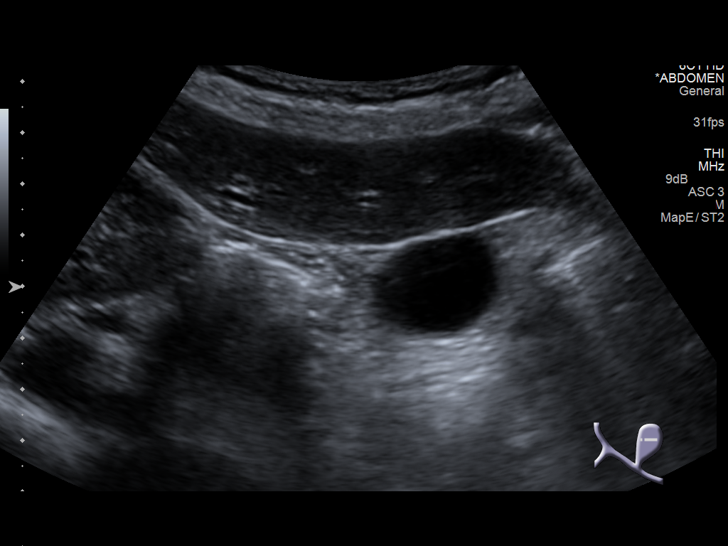
[im 21/81]
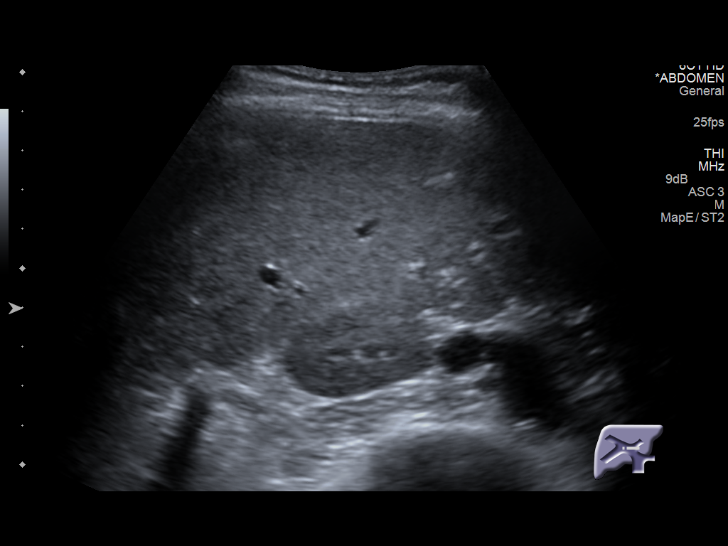
[im 27/81]
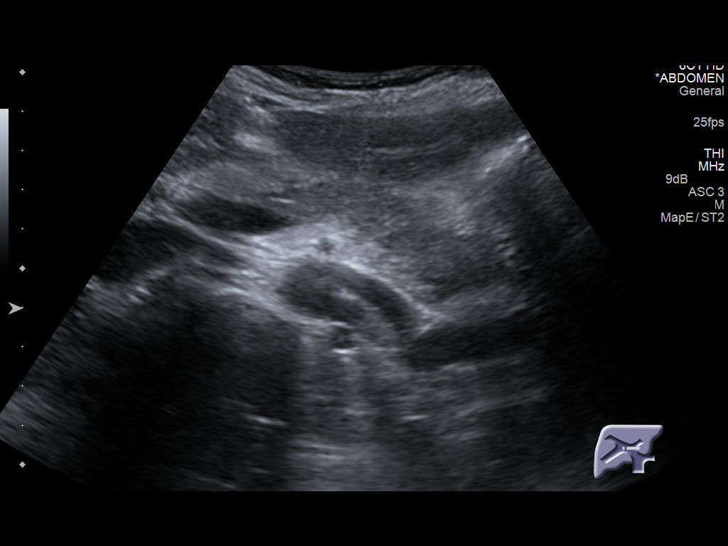
[im 31/81]
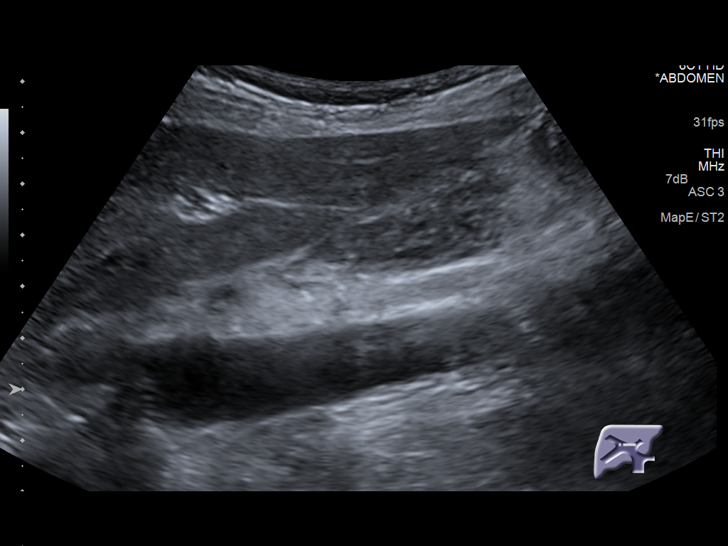
[im 37/81]
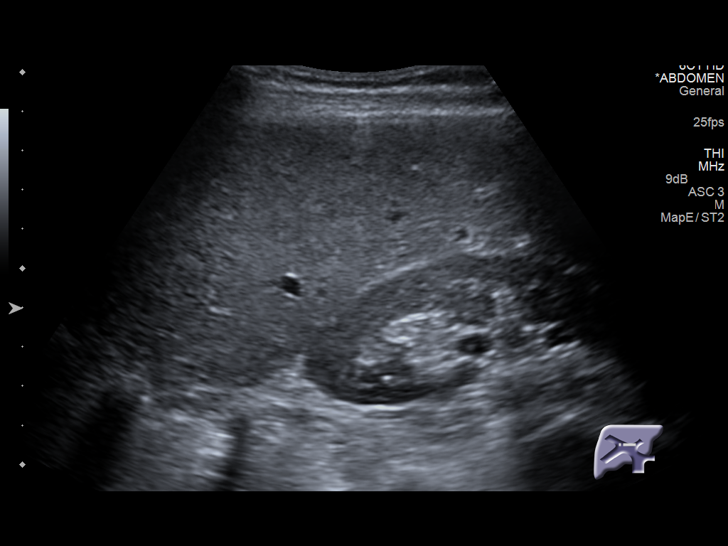
[im 44/81]
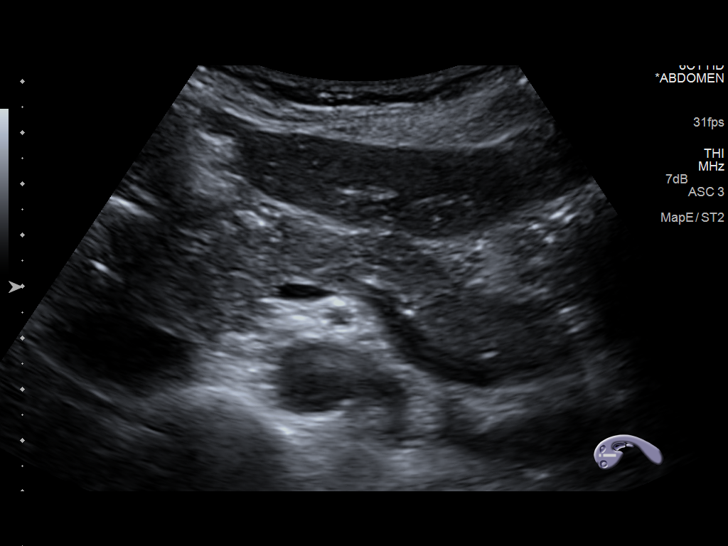
[im 51/81]
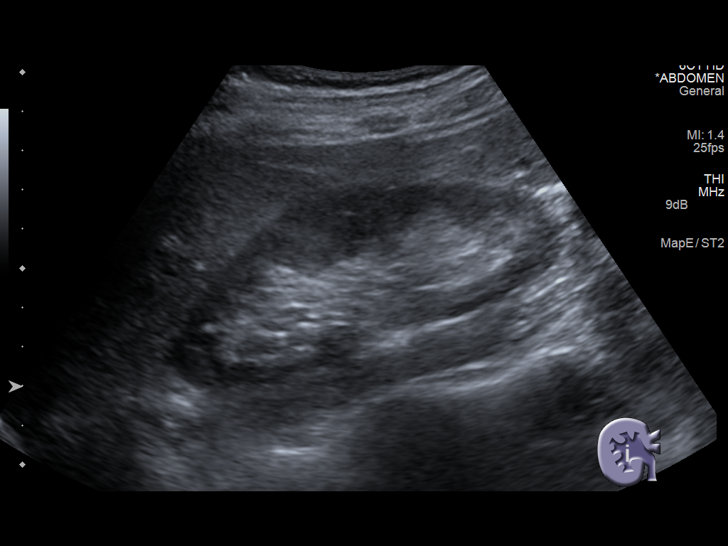
[im 54/81]
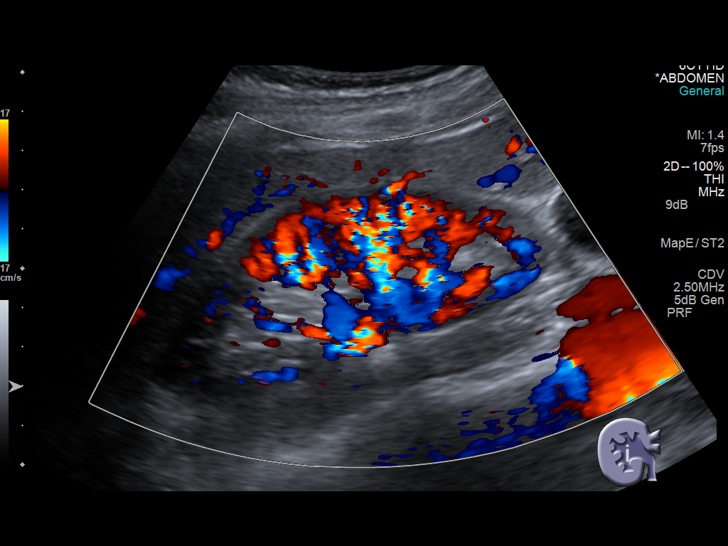
[im 61/81]
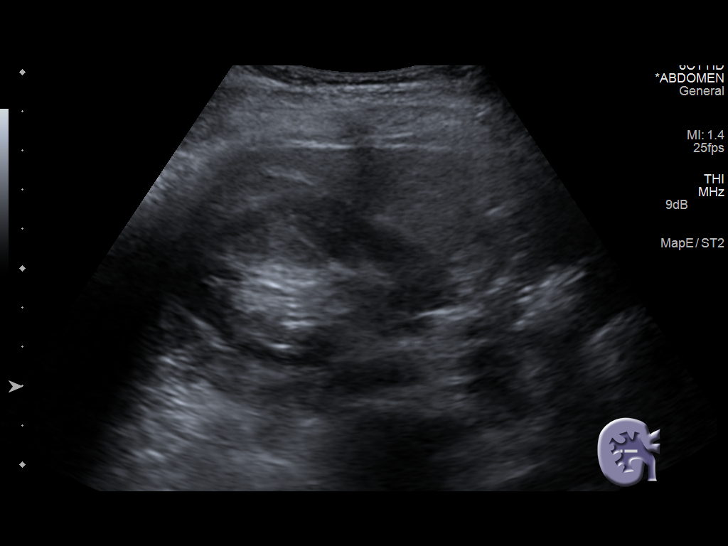
[im 67/81]
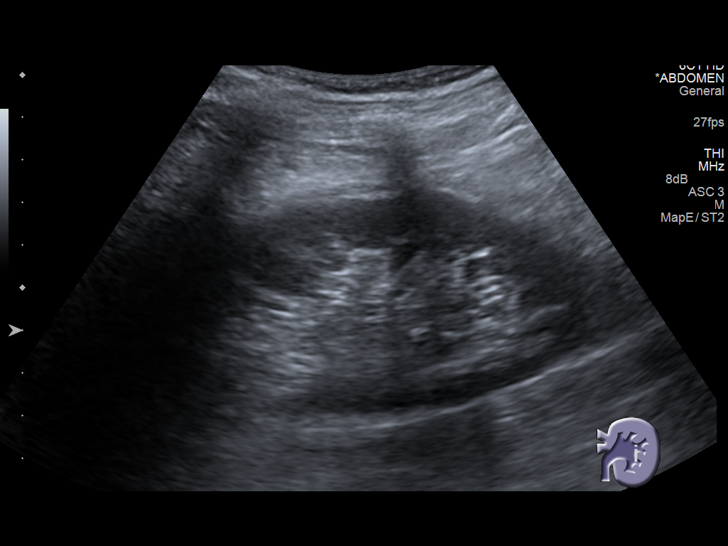
[im 74/81]
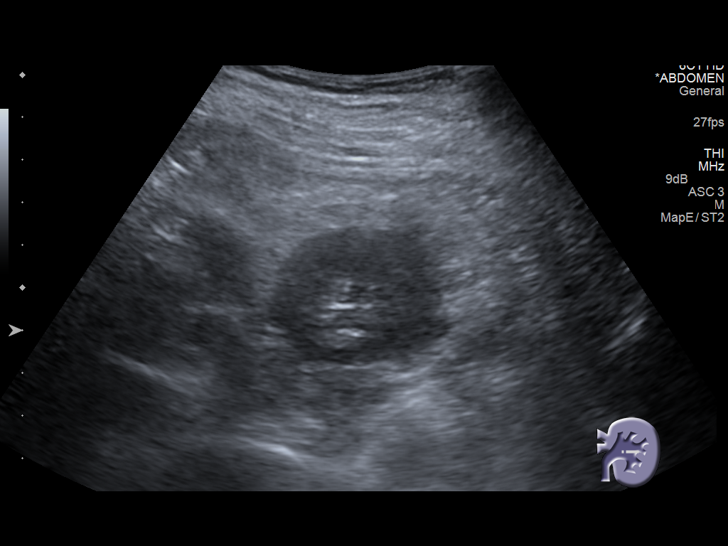
[im 81/81]
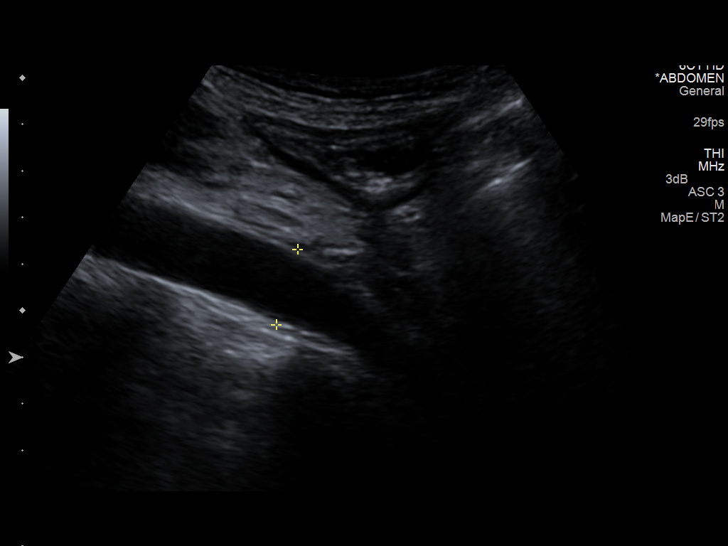

[14 of 25 positions shown; findings below may reference images not displayed]

FINDINGS: Gallbladder: No gallstones or wall thickening visualized. There is
no pericholecystic fluid. No sonographic Murphy sign noted.

Common bile duct: Diameter: 2 mm. There is no intrahepatic, common
hepatic, or common bile duct dilatation.

Liver: No focal lesion identified. Within normal limits in
parenchymal echogenicity.

IVC: No abnormality visualized.

Pancreas: Pancreas is upper normal in size. There is no
peripancreatic inflammation. No pancreatic mass or pseudocyst
appreciable.

Spleen: Size and appearance within normal limits.

Right Kidney: Length: 10.9 cm. Echogenicity within normal limits. No
mass or hydronephrosis visualized.

Left Kidney: Length: 11.0 cm. Echogenicity within normal limits. No
mass or hydronephrosis visualized.

Abdominal aorta: No aneurysm visualized.

Other findings: No demonstrable ascites.
IMPRESSION: Pancreas upper normal in size without focal inflammation or mass.
Study otherwise unremarkable.

## 2018-11-04 ENCOUNTER — Emergency Department (HOSPITAL_COMMUNITY)
Admission: EM | Admit: 2018-11-04 | Discharge: 2018-11-04 | Disposition: A | Discharge status: Home / Self Care | Payer: Medicaid Other | Attending: Emergency Medicine | Admitting: Emergency Medicine

## 2018-11-04 ENCOUNTER — Encounter (HOSPITAL_COMMUNITY): Payer: Self-pay | Admitting: Emergency Medicine

## 2018-11-04 ENCOUNTER — Other Ambulatory Visit: Payer: Self-pay

## 2018-11-04 DIAGNOSIS — Z79899 Other long term (current) drug therapy: Secondary | ICD-10-CM | POA: Diagnosis not present

## 2018-11-04 DIAGNOSIS — F17210 Nicotine dependence, cigarettes, uncomplicated: Secondary | ICD-10-CM | POA: Diagnosis not present

## 2018-11-04 DIAGNOSIS — R1084 Generalized abdominal pain: Secondary | ICD-10-CM | POA: Insufficient documentation

## 2018-11-04 LAB — COMPREHENSIVE METABOLIC PANEL
ALT: 13 U/L (ref 0–44)
AST: 20 U/L (ref 15–41)
Albumin: 3.4 g/dL — ABNORMAL LOW (ref 3.5–5.0)
Alkaline Phosphatase: 60 U/L (ref 38–126)
Anion gap: 8 (ref 5–15)
BUN: 5 mg/dL — ABNORMAL LOW (ref 6–20)
CO2: 22 mmol/L (ref 22–32)
Calcium: 8.9 mg/dL (ref 8.9–10.3)
Chloride: 108 mmol/L (ref 98–111)
Creatinine, Ser: 0.69 mg/dL (ref 0.44–1.00)
GFR calc Af Amer: >60 mL/min (ref >60)
GFR calc non Af Amer: >60 mL/min (ref >60)
Glucose, Bld: 133 mg/dL — ABNORMAL HIGH (ref 70–99)
Potassium: 3.3 mmol/L — ABNORMAL LOW (ref 3.5–5.1)
Sodium: 138 mmol/L (ref 135–145)
Total Bilirubin: 0.5 mg/dL (ref 0.3–1.2)
Total Protein: 6 g/dL — ABNORMAL LOW (ref 6.5–8.1)

## 2018-11-04 LAB — URINALYSIS, ROUTINE W REFLEX MICROSCOPIC
Bilirubin Urine: NEGATIVE
Glucose, UA: NEGATIVE mg/dL
Ketones, ur: NEGATIVE mg/dL
Nitrite: NEGATIVE
Protein, ur: NEGATIVE mg/dL
Specific Gravity, Urine: 1.015 (ref 1.005–1.030)
pH: 6 (ref 5.0–8.0)

## 2018-11-04 LAB — CBC WITH DIFFERENTIAL/PLATELET
Abs Immature Granulocytes: 0.01 10*3/uL (ref 0.00–0.07)
Basophils Absolute: 0 10*3/uL (ref 0.0–0.1)
Basophils Relative: 0 %
Eosinophils Absolute: 0.1 10*3/uL (ref 0.0–0.5)
Eosinophils Relative: 1 %
HCT: 31.3 % — ABNORMAL LOW (ref 36.0–46.0)
Hemoglobin: 9.9 g/dL — ABNORMAL LOW (ref 12.0–15.0)
Immature Granulocytes: 0 %
Lymphocytes Relative: 33 %
Lymphs Abs: 1.9 10*3/uL (ref 0.7–4.0)
MCH: 28.1 pg (ref 26.0–34.0)
MCHC: 31.6 g/dL (ref 30.0–36.0)
MCV: 88.9 fL (ref 80.0–100.0)
Monocytes Absolute: 0.4 10*3/uL (ref 0.1–1.0)
Monocytes Relative: 6 %
Neutro Abs: 3.5 10*3/uL (ref 1.7–7.7)
Neutrophils Relative %: 60 %
Platelets: 258 10*3/uL (ref 150–400)
RBC: 3.52 MIL/uL — ABNORMAL LOW (ref 3.87–5.11)
RDW: 15.6 % — ABNORMAL HIGH (ref 11.5–15.5)
WBC: 5.8 10*3/uL (ref 4.0–10.5)
nRBC: 0 % (ref 0.0–0.2)

## 2018-11-04 LAB — LIPASE, BLOOD: Lipase: 48 U/L (ref 11–51)

## 2018-11-04 LAB — I-STAT BETA HCG BLOOD, ED (MC, WL, AP ONLY): I-stat hCG, quantitative: <5 m[IU]/mL (ref <5)

## 2018-11-04 MED ORDER — ONDANSETRON 4 MG PO TBDP
4.0000 mg | ORAL_TABLET | Freq: Once | ORAL | Status: AC
  Administered 2018-11-04: 4 mg via ORAL
  Filled 2018-11-04: qty 1

## 2018-11-04 MED ORDER — HYDROCODONE-ACETAMINOPHEN 5-325 MG PO TABS
1.0000 | ORAL_TABLET | Freq: Once | ORAL | Status: AC
  Administered 2018-11-04: 1 via ORAL
  Filled 2018-11-04: qty 1

## 2018-11-04 NOTE — ED Provider Notes (Signed)
[facility] EMERGENCY DEPARTMENT Provider Note   CSN: [id number] Arrival date & time: 11/04/18  1210    History   Chief Complaint No chief complaint on file.   HPI Kristina Moyer is a 38 y.o. female.     38yo female presents with complaint of abdominal pain and back pain. Pain started yesterday, is sharp in nature, intermittent, worse with movement and sitting up right. No relief with motrin or tylenol at home. Radiates to diffuse abdomen as well as right and left back. States pain is similar to when she was admitted for pancreatitis. Denies nausea, vomiting, fevers, chills, changes in bowel or bladder habits. LMP last week. No other complaints or concerns.      Past Medical History:  Diagnosis Date  . Alcoholism (HCC)   . Pancreatitis, chronic [facility])     Patient Active Problem List   Diagnosis Date Noted  . Pyrexia   . Alcohol-induced chronic pancreatitis (HCC)   . Tobacco abuse   . Pancreatitis 12/20/2014  . Abdominal pain 12/20/2014  . Tobacco use   . Acute pancreatitis 11/17/2012  . Iron deficiency anemia 11/17/2012  . Increased anion gap metabolic acidosis 11/17/2012  . Alcohol abuse 11/17/2012    Past Surgical History:  Procedure Laterality Date  . TUBAL LIGATION       OB History    Gravida  4   Para  3   Term  3   Preterm      AB  1   Living  3     SAB  1   TAB      Ectopic      Multiple      Live Births               Home Medications    Prior to Admission medications   Medication Sig Start Date End Date Taking? Authorizing Provider  acetaminophen (TYLENOL) 500 MG tablet Take 1,000 mg by mouth every 6 (six) hours as needed for headache.   Yes [provider]  ibuprofen (ADVIL,MOTRIN) 200 MG tablet Take 200 mg by mouth every 6 (six) hours as needed for mild pain or moderate pain.   Yes [provider]  HYDROcodone-acetaminophen (NORCO/VICODIN) 5-325 MG per tablet Take 1 tablet by mouth every 4  (four) hours as needed for moderate pain or severe pain. Patient not taking: Reported on 03/17/2015 12/29/14   Erasmo Downer, MD  omeprazole (PRILOSEC) 20 MG capsule Take 1 capsule (20 mg total) by mouth daily. Patient not taking: Reported on 12/19/2014 10/02/13   Brandt Loosen, MD  ondansetron (ZOFRAN) 4 MG tablet Take 1 tablet (4 mg total) by mouth every 6 (six) hours. Patient not taking: Reported on 12/19/2014 10/02/13   Brandt Loosen, MD    Family History Family History  Family history unknown: Yes    Social History Social History   Tobacco Use  . Smoking status: Current Every Day Smoker    Packs/day: 1.00    Types: Cigarettes  . Smokeless tobacco: Never Used  Substance Use Topics  . Alcohol use: Yes    Comment: occasional  . Drug use: No     Allergies   Patient has no known allergies.   Review of Systems Review of Systems  Constitutional: Negative for chills and fever.  Respiratory: Negative for shortness of breath.   Cardiovascular: Negative for chest pain.  Gastrointestinal: Positive for abdominal pain. Negative for abdominal distention, blood in stool, constipation, diarrhea, nausea and  vomiting.  Genitourinary: Negative for dysuria, frequency and urgency.  Musculoskeletal: Positive for back pain. Negative for myalgias.  Skin: Negative for rash and wound.  Allergic/Immunologic: Negative for immunocompromised state.  Psychiatric/Behavioral: Negative for confusion.  All other systems reviewed and are negative.    Physical Exam Updated Vital Signs BP 107/70   Pulse 98   Temp 98.2 F (36.8 C) (Oral)   Resp 16   Ht  (1.753 m)   Wt 59 kg   LMP 10/31/2018   SpO2 100%   BMI 19.20 kg/m   Physical Exam Vitals signs and nursing note reviewed.  Constitutional:      General: She is not in acute distress.    Appearance: She is well-developed. She is not diaphoretic.  HENT:     Head: Normocephalic and atraumatic.  Cardiovascular:     Rate and Rhythm:  Normal rate and regular rhythm.     Pulses: Normal pulses.     Heart sounds: Normal heart sounds.  Pulmonary:     Effort: Pulmonary effort is normal.     Breath sounds: Normal breath sounds.  Abdominal:     General: Abdomen is flat. There is no distension.     Tenderness: There is abdominal tenderness in the right upper quadrant. There is no right CVA tenderness or left CVA tenderness. Negative signs include Murphy's sign.     Comments: Mild tenderness RUQ  Musculoskeletal:        General: No tenderness.  Skin:    General: Skin is warm and dry.     Findings: No erythema or rash.  Neurological:     Mental Status: She is alert and oriented to person, place, and time.  Psychiatric:        Behavior: Behavior normal.      ED Treatments / Results  Labs (all labs ordered are listed, but only abnormal results are displayed) Labs Reviewed  CBC WITH DIFFERENTIAL/PLATELET - Abnormal; Notable for the following components:      Result Value   RBC 3.52 (*)    Hemoglobin 9.9 (*)    HCT 31.3 (*)    RDW 15.6 (*)    All other components within normal limits  COMPREHENSIVE METABOLIC PANEL - Abnormal; Notable for the following components:   Potassium 3.3 (*)    Glucose, Bld 133 (*)    BUN 5 (*)    Total Protein 6.0 (*)    Albumin 3.4 (*)    All other components within normal limits  URINALYSIS, ROUTINE W REFLEX MICROSCOPIC - Abnormal; Notable for the following components:   APPearance HAZY (*)    Hgb urine dipstick LARGE (*)    Leukocytes,Ua SMALL (*)    Bacteria, UA RARE (*)    All other components within normal limits  LIPASE, BLOOD  I-STAT BETA HCG BLOOD, ED ([facility], [facility], [facility] ONLY)    EKG None  Radiology No results found.  Procedures Procedures (including critical care time)  Medications Ordered in ED Medications  HYDROcodone-acetaminophen (NORCO/VICODIN) 5-325 MG per tablet 1 tablet (1 tablet Oral Given 11/04/18 1243)  ondansetron (ZOFRAN-ODT) disintegrating tablet 4 mg (4 mg  Oral Given 11/04/18 1243)     Initial Impression / Assessment and Plan / ED Course  I have reviewed the triage vital signs and the nursing notes.  Pertinent labs & imaging results that were available during my care of the patient were reviewed by me and considered in my medical decision making (see chart for details).  Clinical Course  as of Nov 04 1426  Tue Nov 04, 2018  1406 37yo female with compalint of abdominal and back pain onset yesterday. On exam, well appearing female, mild TTP RUQ, negative murphy sign. Labs reassuring, hcg negative, UA with hgb, likely contaminant from menstrual cycle. CMP with mild hypokalemia, LFTs WNL. Lipase normal. CBC with Hgb 9.9, unchanged from baseline. Discussed results with patient, pain has improved, ready for dc at this time with plan to recheck with PCP.    [LM]    Clinical Course User Index [LM] Jeannie FendMurphy, Laura A, PA-C    Final Clinical Impressions(s) / ED Diagnoses   Final diagnoses:  Generalized abdominal pain    ED Discharge Orders    None       Alden HippMurphy, Laura A, PA-C 11/04/18 1428    Gerhard MunchLockwood, Robert, MD 11/04/18 [phone]

## 2018-11-04 NOTE — ED Triage Notes (Signed)
Pt here for evaluation of back and abdominal pain that began yesterday.  Pt has tried Advil and Tylenol with no relief.  Pt denies any other symptoms.

## 2018-11-06 ENCOUNTER — Encounter (HOSPITAL_COMMUNITY): Payer: Self-pay | Admitting: Emergency Medicine

## 2018-11-06 ENCOUNTER — Other Ambulatory Visit: Payer: Self-pay

## 2018-11-06 ENCOUNTER — Emergency Department (HOSPITAL_COMMUNITY): Payer: Medicaid Other

## 2018-11-06 ENCOUNTER — Emergency Department (HOSPITAL_COMMUNITY)
Admission: EM | Admit: 2018-11-06 | Discharge: 2018-11-06 | Disposition: A | Discharge status: Home / Self Care | Payer: Medicaid Other | Attending: Emergency Medicine | Admitting: Emergency Medicine

## 2018-11-06 DIAGNOSIS — F17210 Nicotine dependence, cigarettes, uncomplicated: Secondary | ICD-10-CM | POA: Insufficient documentation

## 2018-11-06 DIAGNOSIS — K861 Other chronic pancreatitis: Secondary | ICD-10-CM | POA: Diagnosis not present

## 2018-11-06 DIAGNOSIS — R10815 Periumbilic abdominal tenderness: Secondary | ICD-10-CM | POA: Insufficient documentation

## 2018-11-06 DIAGNOSIS — F1010 Alcohol abuse, uncomplicated: Secondary | ICD-10-CM | POA: Diagnosis not present

## 2018-11-06 DIAGNOSIS — R10811 Right upper quadrant abdominal tenderness: Secondary | ICD-10-CM | POA: Diagnosis not present

## 2018-11-06 DIAGNOSIS — R10812 Left upper quadrant abdominal tenderness: Secondary | ICD-10-CM | POA: Diagnosis not present

## 2018-11-06 DIAGNOSIS — R1013 Epigastric pain: Secondary | ICD-10-CM | POA: Diagnosis present

## 2018-11-06 LAB — CBC WITH DIFFERENTIAL/PLATELET
Abs Immature Granulocytes: 0.02 10*3/uL (ref 0.00–0.07)
Basophils Absolute: 0 10*3/uL (ref 0.0–0.1)
Basophils Relative: 0 %
Eosinophils Absolute: 0 10*3/uL (ref 0.0–0.5)
Eosinophils Relative: 1 %
HCT: 31.5 % — ABNORMAL LOW (ref 36.0–46.0)
Hemoglobin: 10.3 g/dL — ABNORMAL LOW (ref 12.0–15.0)
Immature Granulocytes: 0 %
Lymphocytes Relative: 39 %
Lymphs Abs: 2.4 10*3/uL (ref 0.7–4.0)
MCH: 28.5 pg (ref 26.0–34.0)
MCHC: 32.7 g/dL (ref 30.0–36.0)
MCV: 87.3 fL (ref 80.0–100.0)
Monocytes Absolute: 0.4 10*3/uL (ref 0.1–1.0)
Monocytes Relative: 7 %
Neutro Abs: 3.3 10*3/uL (ref 1.7–7.7)
Neutrophils Relative %: 53 %
Platelets: 292 10*3/uL (ref 150–400)
RBC: 3.61 MIL/uL — ABNORMAL LOW (ref 3.87–5.11)
RDW: 15.4 % (ref 11.5–15.5)
WBC: 6.1 10*3/uL (ref 4.0–10.5)
nRBC: 0 % (ref 0.0–0.2)

## 2018-11-06 LAB — URINALYSIS, ROUTINE W REFLEX MICROSCOPIC
Bacteria, UA: NONE SEEN
Bilirubin Urine: NEGATIVE
Glucose, UA: NEGATIVE mg/dL
Ketones, ur: NEGATIVE mg/dL
Nitrite: NEGATIVE
Protein, ur: NEGATIVE mg/dL
Specific Gravity, Urine: 1.019 (ref 1.005–1.030)
pH: 5 (ref 5.0–8.0)

## 2018-11-06 LAB — COMPREHENSIVE METABOLIC PANEL
ALT: 12 U/L (ref 0–44)
AST: 15 U/L (ref 15–41)
Albumin: 3.6 g/dL (ref 3.5–5.0)
Alkaline Phosphatase: 73 U/L (ref 38–126)
Anion gap: 8 (ref 5–15)
BUN: 7 mg/dL (ref 6–20)
CO2: 23 mmol/L (ref 22–32)
Calcium: 9.1 mg/dL (ref 8.9–10.3)
Chloride: 105 mmol/L (ref 98–111)
Creatinine, Ser: 0.55 mg/dL (ref 0.44–1.00)
GFR calc Af Amer: >60 mL/min (ref >60)
GFR calc non Af Amer: >60 mL/min (ref >60)
Glucose, Bld: 104 mg/dL — ABNORMAL HIGH (ref 70–99)
Potassium: 3.9 mmol/L (ref 3.5–5.1)
Sodium: 136 mmol/L (ref 135–145)
Total Bilirubin: 0.3 mg/dL (ref 0.3–1.2)
Total Protein: 6.6 g/dL (ref 6.5–8.1)

## 2018-11-06 LAB — I-STAT BETA HCG BLOOD, ED (MC, WL, AP ONLY): I-stat hCG, quantitative: <5 m[IU]/mL (ref <5)

## 2018-11-06 LAB — LIPASE, BLOOD: Lipase: 33 U/L (ref 11–51)

## 2018-11-06 MED ORDER — OXYCODONE-ACETAMINOPHEN 5-325 MG PO TABS: 1.0000 | ORAL_TABLET | Freq: Four times a day (QID) | ORAL | 0 refills | Status: DC | PRN

## 2018-11-06 MED ORDER — HYDROMORPHONE HCL 1 MG/ML IJ SOLN
0.5000 mg | Freq: Once | INTRAMUSCULAR | Status: AC
  Administered 2018-11-06: 0.5 mg via INTRAVENOUS
  Filled 2018-11-06: qty 1

## 2018-11-06 MED ORDER — OXYCODONE-ACETAMINOPHEN 5-325 MG PO TABS
1.0000 | ORAL_TABLET | Freq: Once | ORAL | Status: AC
  Administered 2018-11-06: 1 via ORAL
  Filled 2018-11-06: qty 1

## 2018-11-06 MED ORDER — ONDANSETRON 4 MG PO TBDP: 4.0000 mg | ORAL_TABLET | Freq: Three times a day (TID) | ORAL | 0 refills | Status: DC | PRN

## 2018-11-06 MED ORDER — ONDANSETRON HCL 4 MG/2ML IJ SOLN
4.0000 mg | Freq: Once | INTRAMUSCULAR | Status: AC
  Administered 2018-11-06: 4 mg via INTRAVENOUS
  Filled 2018-11-06: qty 2

## 2018-11-06 MED ORDER — IOHEXOL 300 MG/ML  SOLN
100.0000 mL | Freq: Once | INTRAMUSCULAR | Status: AC | PRN
  Administered 2018-11-06: 100 mL via INTRAVENOUS

## 2018-11-06 NOTE — ED Provider Notes (Signed)
[facility] EMERGENCY DEPARTMENT Provider Note   CSN: [id number] Arrival date & time: 11/06/18  1945    History   Chief Complaint Chief Complaint  Patient presents with  . Abdominal Pain    HPI Kristina Moyer is a 38 y.o. female.     Patient with history of pancreatitis, alcohol abuse presents the emergency department with complaint of upper abdominal pain worse in the epigastrium with radiation to the back.  She denies any shortness of breath or cough.  She has not had any vomiting or diarrhea.  No urinary symptoms.  Patient was seen in the emergency department 2 days ago and had reassuring lab work.  No sick contacts or COVID-19 contacts.  Reports last drink, wine, 5 days ago. Feels like her previous pancreatitis. The onset of this condition was acute. The course is constant. Aggravating factors: palpation. Alleviating factors: none.       Past Medical History:  Diagnosis Date  . Alcoholism (HCC)   . Pancreatitis, chronic [facility])     Patient Active Problem List   Diagnosis Date Noted  . Pyrexia   . Alcohol-induced chronic pancreatitis (HCC)   . Tobacco abuse   . Pancreatitis 12/20/2014  . Abdominal pain 12/20/2014  . Tobacco use   . Acute pancreatitis 11/17/2012  . Iron deficiency anemia 11/17/2012  . Increased anion gap metabolic acidosis 11/17/2012  . Alcohol abuse 11/17/2012    Past Surgical History:  Procedure Laterality Date  . TUBAL LIGATION       OB History    Gravida  4   Para  3   Term  3   Preterm      AB  1   Living  3     SAB  1   TAB      Ectopic      Multiple      Live Births               Home Medications    Prior to Admission medications   Medication Sig Start Date End Date Taking? Authorizing Provider  acetaminophen (TYLENOL) 500 MG tablet Take 1,000 mg by mouth every 6 (six) hours as needed for headache.    [provider]  HYDROcodone-acetaminophen (NORCO/VICODIN) 5-325 MG per tablet  Take 1 tablet by mouth every 4 (four) hours as needed for moderate pain or severe pain. Patient not taking: Reported on 03/17/2015 12/29/14   Erasmo Downer, MD  ibuprofen (ADVIL,MOTRIN) 200 MG tablet Take 200 mg by mouth every 6 (six) hours as needed for mild pain or moderate pain.    [provider]  omeprazole (PRILOSEC) 20 MG capsule Take 1 capsule (20 mg total) by mouth daily. Patient not taking: Reported on 12/19/2014 10/02/13   Brandt Loosen, MD  ondansetron (ZOFRAN) 4 MG tablet Take 1 tablet (4 mg total) by mouth every 6 (six) hours. Patient not taking: Reported on 12/19/2014 10/02/13   Brandt Loosen, MD    Family History Family History  Family history unknown: Yes    Social History Social History   Tobacco Use  . Smoking status: Current Every Day Smoker    Packs/day: 1.00    Types: Cigarettes  . Smokeless tobacco: Never Used  Substance Use Topics  . Alcohol use: Yes    Comment: occasional  . Drug use: No     Allergies   Patient has no known allergies.   Review of Systems Review of Systems  Constitutional: Negative for fever.  HENT: Negative for rhinorrhea and sore throat.   Eyes: Negative for redness.  Respiratory: Negative for cough.   Cardiovascular: Negative for chest pain.  Gastrointestinal: Positive for abdominal pain. Negative for diarrhea, nausea and vomiting.  Genitourinary: Negative for dysuria.  Musculoskeletal: Negative for myalgias.  Skin: Negative for rash.  Neurological: Negative for headaches.     Physical Exam Updated Vital Signs BP 115/82   Pulse 80   Temp 98.3 F (36.8 C) (Oral)   Resp 14   Ht  (1.753 m)   Wt 59.9 kg   LMP 10/31/2018   SpO2 100%   BMI 19.49 kg/m   Physical Exam Vitals signs and nursing note reviewed.  Constitutional:      Appearance: She is well-developed.  HENT:     Head: Normocephalic and atraumatic.  Eyes:     General:        Right eye: No discharge.        Left eye: No discharge.      Conjunctiva/sclera: Conjunctivae normal.  Neck:     Musculoskeletal: Normal range of motion and neck supple.  Cardiovascular:     Rate and Rhythm: Normal rate and regular rhythm.     Heart sounds: Normal heart sounds.  Pulmonary:     Effort: Pulmonary effort is normal.     Breath sounds: Normal breath sounds.  Abdominal:     Palpations: Abdomen is soft.     Tenderness: There is abdominal tenderness (mild-moderate) in the right upper quadrant, epigastric area, periumbilical area and left upper quadrant.  Genitourinary:    Rectum: Mass present.  Skin:    General: Skin is warm and dry.  Neurological:     Mental Status: She is alert.      ED Treatments / Results  Labs (all labs ordered are listed, but only abnormal results are displayed) Labs Reviewed  CBC WITH DIFFERENTIAL/PLATELET - Abnormal; Notable for the following components:      Result Value   RBC 3.61 (*)    Hemoglobin 10.3 (*)    HCT 31.5 (*)    All other components within normal limits  COMPREHENSIVE METABOLIC PANEL - Abnormal; Notable for the following components:   Glucose, Bld 104 (*)    All other components within normal limits  URINALYSIS, ROUTINE W REFLEX MICROSCOPIC - Abnormal; Notable for the following components:   APPearance HAZY (*)    Hgb urine dipstick SMALL (*)    Leukocytes,Ua TRACE (*)    All other components within normal limits  LIPASE, BLOOD  I-STAT BETA HCG BLOOD, ED ([facility], [facility], [facility] ONLY)    EKG None  Radiology Ct Abdomen Pelvis W Contrast  Result Date: 11/06/2018 CLINICAL DATA:  Epigastric pain with radiation to chest and back. Symptoms for 3 days. History of alcohol-induced pancreatitis. Alcohol abuse EXAM: CT ABDOMEN AND PELVIS WITH CONTRAST TECHNIQUE: Multidetector CT imaging of the abdomen and pelvis was performed using the standard protocol following bolus administration of intravenous contrast. CONTRAST:  OMNIPAQUE IOHEXOL 300 MG/ML  SOLN COMPARISON:  04/08/2015 ultrasound. Most  recent CT 12/23/2014. FINDINGS: Lower chest: Clear lung bases. Normal heart size without pericardial or pleural effusion. Hepatobiliary: Prominent lateral segment left liver lobe. High left hepatic lobe probable hemangioma including at 1.2 cm. Present back in 2016. Normal gallbladder, without biliary ductal dilatation. Pancreas: Suspect mild peripancreatic edema adjacent the head and uncinate process, including on image 29/3. No pancreatic duct dilatation or well-defined peripancreatic fluid collection. Spleen: Normal in size, without focal abnormality.  Adrenals/Urinary Tract: Normal adrenal glands. Right renal too small to characterize lesion. Normal left kidney. Normal urinary bladder. Stomach/Bowel: Normal stomach, without wall thickening. Colonic stool burden suggests constipation. Normal terminal ileum and appendix. Normal small bowel. Vascular/Lymphatic: Mild but markedly age advanced aortic atherosclerosis. No abdominopelvic adenopathy. Reproductive: Normal uterus and adnexa. Prominent gonadal veins. Other: Trace free pelvic fluid is likely physiologic. No abdominal ascites. Mild ventral abdominal wall laxity. Musculoskeletal: No acute osseous abnormality. IMPRESSION: 1. Suspicion of mild uncomplicated pancreatitis. 2. Prominent gonadal veins, as can be seen with pelvic congestion syndrome. 3. Possible constipation. 4. Prominent lateral segment left liver lobe. Cannot exclude mild cirrhosis in this patient with a history of alcohol abuse. Aortic Atherosclerosis (ICD10-I70.0). This is markedly age advanced. Electronically Signed   By: Jeronimo GreavesKyle  Talbot M.D.   On: 11/06/2018 21:59    Procedures Procedures (including critical care time)  Medications Ordered in ED Medications  HYDROmorphone (DILAUDID) injection 0.5 mg (0.5 mg Intravenous Given 11/06/18 2025)  ondansetron (ZOFRAN) injection 4 mg (4 mg Intravenous Given 11/06/18 2021)  iohexol (OMNIPAQUE) 300 MG/ML solution 100 mL (100 mLs Intravenous Contrast  Given 11/06/18 2118)  oxyCODONE-acetaminophen (PERCOCET/ROXICET) 5-325 MG per tablet 1 tablet (1 tablet Oral Given 11/06/18 2337)     Initial Impression / Assessment and Plan / ED Course  I have reviewed the triage vital signs and the nursing notes.  Pertinent labs & imaging results that were available during my care of the patient were reviewed by me and considered in my medical decision making (see chart for details).        Patient seen and examined. Work-up initiated. Medications ordered.   Vital signs reviewed and are as follows: BP 115/82   Pulse 80   Temp 98.3 F (36.8 C) (Oral)   Resp 14   Ht 5\' 9"  (1.753 m)   Wt 59.9 kg   LMP 10/31/2018   SpO2 100%   BMI 19.49 kg/m   Differential diagnosis: Acute pancreatitis, acute on chronic pancreatitis, cholelithiasis, cholecystitis, lower lobe pneumonia.  Patient was seen 2 days ago with reassuring work-up and was sent home with symptomatic care, however symptoms are worsening.  Discussed with patient, will proceed with imaging to help delineate possible etiologies and rule out emergent condition.  11:39 PM CT images personally reviewed and interpreted.    Patient updated on results.  She is tolerating oral fluids without vomiting.  Her pain is controlled.  We will discharge home with #5 Percocet and Zofran.  Described clear liquid diet and progression to bland foods.  The patient was urged to return to the Emergency Department immediately with worsening of current symptoms, worsening abdominal pain, persistent vomiting, blood noted in stools, fever, or any other concerns. The patient verbalized understanding.   Patient counseled on use of narcotic pain medications. Counseled not to combine these medications with others containing tylenol. Urged not to drink alcohol, drive, or perform any other activities that requires focus while taking these medications. The patient verbalizes understanding and agrees with the plan.    Final  Clinical Impressions(s) / ED Diagnoses   Final diagnoses:  Epigastric pain   Patient with epigastric pain.  History of alcohol induced pancreatitis.  Lab work-up is reassuring today.  Given second visit in the past 48 hours, CT imaging ordered to further delineate possible causes of her pain.  Most likely explanation is a very mild pancreatitis.  Patient symptoms are very well controlled here in the emergency department.  She will be treated as  above with strict return instructions.  She does not appear to be appreciably dehydrated.  She is no longer vomiting and her pain is controlled.  ED Discharge Orders         Ordered    ondansetron (ZOFRAN ODT) 4 MG disintegrating tablet  Every 8 hours PRN     11/06/18 2325    oxyCODONE-acetaminophen (PERCOCET/ROXICET) 5-325 MG tablet  Every 6 hours PRN     11/06/18 2325           Renne Crigler, PA-C 11/06/18 2347    Wynetta Fines, MD 11/12/18 [phone]

## 2018-11-06 NOTE — ED Notes (Signed)
Reviewed d/c instructions with pt, who verbalized understanding and had no outstanding questions. [employer] & pt labels removed and disposed of in shred bin. Pt departed in NAD, refused use of wheelchair.   

## 2018-11-06 NOTE — ED Triage Notes (Signed)
Pt reports epigastric pain that radiates to her chest & back, without any N/V/D, going on for 3 days. States that it's her pancreas. Recently evaluated for same, without relief. Denies cough, SOB, fevers, recent travel, or sick contacts.

## 2018-11-06 NOTE — ED Notes (Signed)
Patient transported to CT 

## 2018-11-06 NOTE — Discharge Instructions (Signed)
Please read and follow all provided instructions.  Your diagnoses today include:  1. Epigastric pain     Tests performed today include:  Blood counts and electrolytes  Blood tests to check liver and kidney function  Blood tests to check pancreas function  Urine test to look for infection and pregnancy (in women)  CT scan - shows mild pancreatitis, no other infections.  Vital signs. See below for your results today.   Medications prescribed:   Percocet (oxycodone/acetaminophen) - narcotic pain medication  DO NOT drive or perform any activities that require you to be awake and alert because this medicine can make you drowsy. BE VERY CAREFUL not to take multiple medicines containing Tylenol (also called acetaminophen). Doing so can lead to an overdose which can damage your liver and cause liver failure and possibly death.   Zofran (ondansetron) - for nausea and vomiting  Take any prescribed medications only as directed.  Home care instructions:   Follow any educational materials contained in this packet.  Follow a clear liquid diet for the next 24 hours then progress to a bland diet if you are not having any vomiting or worsening pain.  Follow-up instructions: Please follow-up with your primary care provider in the next 2 days for further evaluation of your symptoms.    Return instructions:  SEEK IMMEDIATE MEDICAL ATTENTION IF:  The pain does not go away or becomes severe   A temperature above 101F develops   Repeated vomiting occurs (multiple episodes)   The pain becomes localized to portions of the abdomen. The right side could possibly be appendicitis. In an adult, the left lower portion of the abdomen could be colitis or diverticulitis.   Blood is being passed in stools or vomit (bright red or black tarry stools)   You develop chest pain, difficulty breathing, dizziness or fainting, or become confused, poorly responsive, or inconsolable (young children)  If you  have any other emergent concerns regarding your health  Additional Information: Abdominal (belly) pain can be caused by many things. Your caregiver performed an examination and possibly ordered blood/urine tests and imaging (CT scan, x-rays, ultrasound). Many cases can be observed and treated at home after initial evaluation in the emergency department. Even though you are being discharged home, abdominal pain can be unpredictable. Therefore, you need a repeated exam if your pain does not resolve, returns, or worsens. Most patients with abdominal pain don't have to be admitted to the hospital or have surgery, but serious problems like appendicitis and gallbladder attacks can start out as nonspecific pain. Many abdominal conditions cannot be diagnosed in one visit, so follow-up evaluations are very important.  Your vital signs today were: BP 126/83    Pulse 61    Temp 98.3 F (36.8 C) (Oral)    Resp 16    Ht 5\' 9"  (1.753 m)    Wt 59.9 kg    LMP 10/31/2018    SpO2 100%    BMI 19.49 kg/m  If your blood pressure (bp) was elevated above 135/85 this visit, please have this repeated by your doctor within one month. --------------

## 2018-11-13 ENCOUNTER — Telehealth: Payer: Self-pay | Admitting: *Deleted

## 2018-11-13 NOTE — Telephone Encounter (Signed)
Medical Assistant left message on patient's home and cell voicemail. Voicemail states to give a call back to [place] with [facility] at [phone]. Patient is aware of visit tomorrow being a lab for repeat CBC and UA and a tele visit to discuss HFU

## 2018-11-14 ENCOUNTER — Inpatient Hospital Stay: Payer: Self-pay | Admitting: Primary Care

## 2018-12-28 ENCOUNTER — Emergency Department (HOSPITAL_COMMUNITY)
Admission: EM | Admit: 2018-12-28 | Discharge: 2018-12-29 | Discharge status: Against Medical Advice | Payer: Medicaid Other | Attending: Emergency Medicine | Admitting: Emergency Medicine

## 2018-12-28 ENCOUNTER — Encounter (HOSPITAL_COMMUNITY): Payer: Self-pay

## 2018-12-28 ENCOUNTER — Other Ambulatory Visit: Payer: Self-pay

## 2018-12-28 DIAGNOSIS — R1011 Right upper quadrant pain: Secondary | ICD-10-CM | POA: Insufficient documentation

## 2018-12-28 DIAGNOSIS — N3000 Acute cystitis without hematuria: Secondary | ICD-10-CM | POA: Insufficient documentation

## 2018-12-28 DIAGNOSIS — R1013 Epigastric pain: Secondary | ICD-10-CM | POA: Diagnosis not present

## 2018-12-28 DIAGNOSIS — F17210 Nicotine dependence, cigarettes, uncomplicated: Secondary | ICD-10-CM | POA: Insufficient documentation

## 2018-12-28 DIAGNOSIS — M549 Dorsalgia, unspecified: Secondary | ICD-10-CM | POA: Diagnosis present

## 2018-12-28 LAB — URINALYSIS, ROUTINE W REFLEX MICROSCOPIC
Bacteria, UA: NONE SEEN
Bilirubin Urine: NEGATIVE
Glucose, UA: NEGATIVE mg/dL
Hgb urine dipstick: NEGATIVE
Ketones, ur: NEGATIVE mg/dL
Nitrite: NEGATIVE
Protein, ur: 30 mg/dL — AB
Specific Gravity, Urine: 1.025 (ref 1.005–1.030)
pH: 6 (ref 5.0–8.0)

## 2018-12-28 LAB — CBC
HCT: 32 % — ABNORMAL LOW (ref 36.0–46.0)
Hemoglobin: 10.5 g/dL — ABNORMAL LOW (ref 12.0–15.0)
MCH: 28.6 pg (ref 26.0–34.0)
MCHC: 32.8 g/dL (ref 30.0–36.0)
MCV: 87.2 fL (ref 80.0–100.0)
Platelets: 302 10*3/uL (ref 150–400)
RBC: 3.67 MIL/uL — ABNORMAL LOW (ref 3.87–5.11)
RDW: 15.1 % (ref 11.5–15.5)
WBC: 5.1 10*3/uL (ref 4.0–10.5)
nRBC: 0 % (ref 0.0–0.2)

## 2018-12-28 LAB — COMPREHENSIVE METABOLIC PANEL
ALT: 10 U/L (ref 0–44)
AST: 15 U/L (ref 15–41)
Albumin: 3.8 g/dL (ref 3.5–5.0)
Alkaline Phosphatase: 69 U/L (ref 38–126)
Anion gap: 8 (ref 5–15)
BUN: 9 mg/dL (ref 6–20)
CO2: 22 mmol/L (ref 22–32)
Calcium: 9.2 mg/dL (ref 8.9–10.3)
Chloride: 105 mmol/L (ref 98–111)
Creatinine, Ser: 0.79 mg/dL (ref 0.44–1.00)
GFR calc Af Amer: >60 mL/min (ref >60)
GFR calc non Af Amer: >60 mL/min (ref >60)
Glucose, Bld: 103 mg/dL — ABNORMAL HIGH (ref 70–99)
Potassium: 4.1 mmol/L (ref 3.5–5.1)
Sodium: 135 mmol/L (ref 135–145)
Total Bilirubin: 0.7 mg/dL (ref 0.3–1.2)
Total Protein: 6.4 g/dL — ABNORMAL LOW (ref 6.5–8.1)

## 2018-12-28 LAB — I-STAT BETA HCG BLOOD, ED (MC, WL, AP ONLY): I-stat hCG, quantitative: <5 m[IU]/mL (ref <5)

## 2018-12-28 LAB — LIPASE, BLOOD: Lipase: 36 U/L (ref 11–51)

## 2018-12-28 MED ORDER — SODIUM CHLORIDE 0.9% FLUSH: 3.0000 mL | Freq: Once | INTRAVENOUS | Status: DC

## 2018-12-28 NOTE — ED Triage Notes (Signed)
Pt reports mid back pain that started last night, hx of pancreatitis but states she hasnt been drinking recently. Pt denies n/v

## 2018-12-29 ENCOUNTER — Encounter (HOSPITAL_COMMUNITY): Payer: Self-pay | Admitting: Emergency Medicine

## 2018-12-29 ENCOUNTER — Emergency Department (HOSPITAL_COMMUNITY)
Admission: EM | Admit: 2018-12-29 | Discharge: 2018-12-30 | Disposition: A | Discharge status: Home / Self Care | Payer: Medicaid Other | Source: Home / Self Care | Attending: Emergency Medicine | Admitting: Emergency Medicine

## 2018-12-29 ENCOUNTER — Other Ambulatory Visit: Payer: Self-pay

## 2018-12-29 DIAGNOSIS — N3000 Acute cystitis without hematuria: Secondary | ICD-10-CM | POA: Insufficient documentation

## 2018-12-29 DIAGNOSIS — R1011 Right upper quadrant pain: Secondary | ICD-10-CM | POA: Insufficient documentation

## 2018-12-29 DIAGNOSIS — R1013 Epigastric pain: Secondary | ICD-10-CM | POA: Insufficient documentation

## 2018-12-29 DIAGNOSIS — F17210 Nicotine dependence, cigarettes, uncomplicated: Secondary | ICD-10-CM | POA: Insufficient documentation

## 2018-12-29 NOTE — ED Notes (Signed)
Patient was upset about the wait time and left.

## 2018-12-29 NOTE — ED Triage Notes (Signed)
Pt c/o back pains and abd pains since last night. Reports drinking some ETOH. Hx pancreatitis. Went to [facility] ED last night but wait was so long left before being seen. Denies n/v/d. Or urinary problems.

## 2018-12-29 NOTE — ED Provider Notes (Signed)
[facility]-EMERGENCY DEPT Provider Note   CSN: [id number] Arrival date & time: 12/29/18  1820    History   Chief Complaint Chief Complaint  Patient presents with  . Abdominal Pain  . Back Pain    HPI Kristina Moyer is a 38 y.o. female with a PMH of alcohol abuse and pancreatitis presenting with constant epigastric abdominal pain radiating to back onset last night. Patient states [facility], but the wait was too long and she left. Patient states symptoms resolved on their own and started again at 4pm while at a graduation. Patient describes symptoms as sharp and states nothing makes symptoms better or worse. Patient states she had 2 glasses of wine 3 days ago, but denies any alcohol use today. Patient denies nausea, vomiting, diarrhea, or constipation. Patient states last BM was yesterday and it was normal. Patient reports taking tylenol prior to arrival without relief. Patient reports urinary frequency. Patient denies dysuria, hematuria, vaginal discharge, or vaginal bleeding. Patient denies any abdominal surgeries. Patient states she last ate this morning. Patient reports LMP was on 12/10/2018. Patient denies fever, chills, cough, congestion, shortness of breath, chest pain, sick contacts, or recent travel. Patient denies drug use.      HPI  Past Medical History:  Diagnosis Date  . Alcoholism (HCC)   . Pancreatitis, chronic [facility](HCC)     Patient Active Problem List   Diagnosis Date Noted  . Pyrexia   . Alcohol-induced chronic pancreatitis (HCC)   . Tobacco abuse   . Pancreatitis 12/20/2014  . Abdominal pain 12/20/2014  . Tobacco use   . Acute pancreatitis 11/17/2012  . Iron deficiency anemia 11/17/2012  . Increased anion gap metabolic acidosis 11/17/2012  . Alcohol abuse 11/17/2012    Past Surgical History:  Procedure Laterality Date  . TUBAL LIGATION       OB History    Gravida  4   Para  3   Term  3   Preterm     AB  1   Living  3     SAB  1   TAB      Ectopic      Multiple      Live Births               Home Medications    Prior to Admission medications   Medication Sig Start Date End Date Taking? Authorizing Provider  acetaminophen (TYLENOL) 500 MG tablet Take 2,000 mg by mouth every 8 (eight) hours as needed for moderate pain.    Yes [provider]  nitrofurantoin, macrocrystal-monohydrate, (MACROBID) 100 MG capsule Take 1 capsule (100 mg total) by mouth 2 (two) times daily. 12/30/18   Ardyth HarpsHernandez, Cordai Rodrigue P, PA-C  ondansetron (ZOFRAN ODT) 4 MG disintegrating tablet Take 1 tablet (4 mg total) by mouth every 8 (eight) hours as needed for nausea or vomiting. Patient not taking: Reported on 12/30/2018 11/06/18   Renne CriglerGeiple, Joshua, PA-C  oxyCODONE-acetaminophen (PERCOCET/ROXICET) 5-325 MG tablet Take 1 tablet by mouth every 6 (six) hours as needed for severe pain. Patient not taking: Reported on 12/30/2018 11/06/18   Renne CriglerGeiple, Joshua, PA-C    Family History Family History  Family history unknown: Yes    Social History Social History   Tobacco Use  . Smoking status: Current Every Day Smoker    Packs/day: 1.00    Types: Cigarettes  . Smokeless tobacco: Never Used  Substance Use Topics  . Alcohol use: Yes  Comment: occasional  . Drug use: No     Allergies   Patient has no known allergies.   Review of Systems Review of Systems  Constitutional: Negative for activity change, appetite change, chills, fever and unexpected weight change.  HENT: Negative for congestion, rhinorrhea and sore throat.   Eyes: Negative for visual disturbance.  Respiratory: Negative for cough and shortness of breath.   Cardiovascular: Negative for chest pain.  Gastrointestinal: Positive for abdominal pain. Negative for constipation, diarrhea, nausea and vomiting.  Endocrine: Negative for polydipsia, polyphagia and polyuria.  Genitourinary: Positive for frequency. Negative for dysuria, flank pain,  pelvic pain, vaginal bleeding, vaginal discharge and vaginal pain.  Musculoskeletal: Positive for back pain. Negative for myalgias.  Skin: Negative for rash.  Allergic/Immunologic: Negative for immunocompromised state.  Psychiatric/Behavioral: The patient is not nervous/anxious.      Physical Exam Updated Vital Signs BP 104/78   Pulse 64   Temp 98.5 F (36.9 C) (Oral)   Resp 15   LMP 12/10/2018   SpO2 100%   Physical Exam Vitals signs and nursing note reviewed.  Constitutional:      General: She is not in acute distress.    Appearance: She is well-developed. She is not diaphoretic.  HENT:     Head: Normocephalic and atraumatic.  Eyes:     General: No scleral icterus. Neck:     Musculoskeletal: Normal range of motion and neck supple.  Cardiovascular:     Rate and Rhythm: Normal rate and regular rhythm.     Heart sounds: Normal heart sounds. No murmur. No friction rub. No gallop.   Pulmonary:     Effort: Pulmonary effort is normal. No respiratory distress.     Breath sounds: Normal breath sounds. No wheezing or rales.  Abdominal:     General: Abdomen is flat. Bowel sounds are normal. There is no distension.     Palpations: Abdomen is soft. Abdomen is not rigid. There is no mass.     Tenderness: There is abdominal tenderness in the epigastric area. There is no right CVA tenderness, left CVA tenderness, guarding or rebound. Negative signs include Murphy's sign and McBurney's sign.     Hernia: No hernia is present.  Musculoskeletal: Normal range of motion.     Cervical back: Normal. She exhibits normal range of motion, no tenderness and no bony tenderness.     Thoracic back: Normal. She exhibits normal range of motion, no tenderness and no bony tenderness.     Lumbar back: Normal. She exhibits normal range of motion, no tenderness and no bony tenderness.  Skin:    General: Skin is warm.     Findings: No rash.  Neurological:     Mental Status: She is alert and oriented to  person, place, and time.     ED Treatments / Results  Labs (all labs ordered are listed, but only abnormal results are displayed) Labs Reviewed  COMPREHENSIVE METABOLIC PANEL - Abnormal; Notable for the following components:      Result Value   Sodium 134 (*)    Calcium 8.8 (*)    AST 14 (*)    All other components within normal limits  CBC - Abnormal; Notable for the following components:   RBC 3.75 (*)    Hemoglobin 10.8 (*)    HCT 33.9 (*)    All other components within normal limits  URINALYSIS, ROUTINE W REFLEX MICROSCOPIC - Abnormal; Notable for the following components:   APPearance HAZY (*)  Leukocytes,Ua LARGE (*)    Bacteria, UA RARE (*)    All other components within normal limits  URINE CULTURE  LIPASE, BLOOD  I-STAT BETA HCG BLOOD, ED ([facility], [facility], [facility] ONLY)    EKG None  Radiology [place] Abdomen Limited Ruq  Result Date: 12/30/2018 CLINICAL DATA:  38 year old female with 2 days of right upper quadrant pain. EXAM: ULTRASOUND ABDOMEN LIMITED RIGHT UPPER QUADRANT COMPARISON:  CT Abdomen and Pelvis 11/06/2018. FINDINGS: Gallbladder: No gallstones or wall thickening visualized. No sonographic Murphy sign noted by sonographer. Common bile duct: Diameter: 3 millimeters, normal. Liver: No focal lesion identified. Within normal limits in parenchymal echogenicity. Portal vein is patent on color Doppler imaging with normal direction of blood flow towards the liver. Other findings: Negative visible right kidney.  No free fluid. IMPRESSION: Negative right upper quadrant ultrasound. Electronically Signed   By: Odessa FlemingH  Hall M.D.   On: 12/30/2018 01:42    Procedures Procedures (including critical care time)  Medications Ordered in ED Medications  sodium chloride 0.9 % bolus 1,000 mL (1,000 mLs Intravenous New Bag/Given 12/30/18 0028)  morphine 4 MG/ML injection 4 mg (4 mg Intravenous Given 12/30/18 0029)     Initial Impression / Assessment and Plan / ED Course  I have reviewed the triage  vital signs and the nursing notes.  Pertinent labs & imaging results that were available during my care of the patient were reviewed by me and considered in my medical decision making (see chart for details).  Clinical Course as of Dec 30 203  Tue Dec 30, 2018  0041 UA reveals large leukocytes, rare bacteria, WBCs, and RBCs. Patient reports urinary frequency, but denies dysuria or hematuria. Will order urine culture.  Urinalysis, Routine w reflex microscopic(!) [AH]  0148 Negative right upper quadrant ultrasound.  US Abdomen Limited RUQ [AH]  0157 Reassessed patient. Patient denies any further abdominal pain. Patient states symptoms have completely resolved. Discussed findings with patient. Shared decision making was discussed regarding obtaining a CT abdomen. Patient does not want to proceed and refused a CT abdomen at this time. Patient states she will like to be discharged at this time.    [AH]    Clinical Course User Index [AH] Leretha DykesHernandez, Dede Dobesh P, PA-C      Patient presents with abdominal pain. Patient is nontoxic, nonseptic appearing, in no apparent distress.  Patient's pain and other symptoms adequately managed in emergency department.  Fluid bolus and pain medicine given.  Labs, imaging and vitals reviewed.  Lipase is within normal limits. Ultrasound was negative. Patient does not meet the SIRS or Sepsis criteria.  On repeat exam patient does not have a surgical abdomin and there are no peritoneal signs.  No indication of appendicitis, bowel obstruction, bowel perforation, cholecystitis, diverticulitis, PID or ectopic pregnancy.  UA reveals large leukocytes, WBCs, RBCs, and bacteria. Pt has been diagnosed with a UTI. Pt is afebrile, no CVA tenderness, normotensive, and denies N/V. Pt to be dc home with antibiotics and instructions to follow up with PCP if symptoms persist. Patient discharged home with antibiotics and given strict instructions for follow-up with their primary care physician  and GI if symptoms persist. I have also discussed reasons to return immediately to the ER.  Patient expresses understanding and agrees with plan.  Final Clinical Impressions(s) / ED Diagnoses   Final diagnoses:  Abdominal pain, epigastric  Acute cystitis without hematuria    ED Discharge Orders         Ordered    nitrofurantoin,  macrocrystal-monohydrate, (MACROBID) 100 MG capsule  2 times daily     12/30/18 0204           Arville Lime, PA-C 93/11/21 6244    Delora Fuel, MD 69/50/72 [phone]

## 2018-12-30 ENCOUNTER — Emergency Department (HOSPITAL_COMMUNITY): Payer: Medicaid Other

## 2018-12-30 LAB — CBC
HCT: 33.9 % — ABNORMAL LOW (ref 36.0–46.0)
Hemoglobin: 10.8 g/dL — ABNORMAL LOW (ref 12.0–15.0)
MCH: 28.8 pg (ref 26.0–34.0)
MCHC: 31.9 g/dL (ref 30.0–36.0)
MCV: 90.4 fL (ref 80.0–100.0)
Platelets: 302 10*3/uL (ref 150–400)
RBC: 3.75 MIL/uL — ABNORMAL LOW (ref 3.87–5.11)
RDW: 15.3 % (ref 11.5–15.5)
WBC: 6.8 10*3/uL (ref 4.0–10.5)
nRBC: 0 % (ref 0.0–0.2)

## 2018-12-30 LAB — COMPREHENSIVE METABOLIC PANEL
ALT: 9 U/L (ref 0–44)
AST: 14 U/L — ABNORMAL LOW (ref 15–41)
Albumin: 4.1 g/dL (ref 3.5–5.0)
Alkaline Phosphatase: 67 U/L (ref 38–126)
Anion gap: 6 (ref 5–15)
BUN: 14 mg/dL (ref 6–20)
CO2: 23 mmol/L (ref 22–32)
Calcium: 8.8 mg/dL — ABNORMAL LOW (ref 8.9–10.3)
Chloride: 105 mmol/L (ref 98–111)
Creatinine, Ser: 0.62 mg/dL (ref 0.44–1.00)
GFR calc Af Amer: >60 mL/min (ref >60)
GFR calc non Af Amer: >60 mL/min (ref >60)
Glucose, Bld: 98 mg/dL (ref 70–99)
Potassium: 4.2 mmol/L (ref 3.5–5.1)
Sodium: 134 mmol/L — ABNORMAL LOW (ref 135–145)
Total Bilirubin: 0.5 mg/dL (ref 0.3–1.2)
Total Protein: 7 g/dL (ref 6.5–8.1)

## 2018-12-30 LAB — URINALYSIS, ROUTINE W REFLEX MICROSCOPIC
Bilirubin Urine: NEGATIVE
Glucose, UA: NEGATIVE mg/dL
Hgb urine dipstick: NEGATIVE
Ketones, ur: NEGATIVE mg/dL
Nitrite: NEGATIVE
Protein, ur: NEGATIVE mg/dL
Specific Gravity, Urine: 1.025 (ref 1.005–1.030)
pH: 5 (ref 5.0–8.0)

## 2018-12-30 LAB — I-STAT BETA HCG BLOOD, ED (MC, WL, AP ONLY): I-stat hCG, quantitative: <5 m[IU]/mL (ref <5)

## 2018-12-30 LAB — LIPASE, BLOOD: Lipase: 29 U/L (ref 11–51)

## 2018-12-30 MED ORDER — SODIUM CHLORIDE 0.9 % IV BOLUS
1000.0000 mL | Freq: Once | INTRAVENOUS | Status: AC
  Administered 2018-12-30: 1000 mL via INTRAVENOUS

## 2018-12-30 MED ORDER — NITROFURANTOIN MONOHYD MACRO 100 MG PO CAPS: 100.0000 mg | ORAL_CAPSULE | Freq: Two times a day (BID) | ORAL | 0 refills | Status: DC

## 2018-12-30 MED ORDER — MORPHINE SULFATE (PF) 4 MG/ML IV SOLN
4.0000 mg | Freq: Once | INTRAVENOUS | Status: AC
  Administered 2018-12-30: 4 mg via INTRAVENOUS
  Filled 2018-12-30: qty 1

## 2018-12-30 NOTE — Discharge Instructions (Addendum)
You have been seen today for abdominal pain. Please read and follow all provided instructions.   1. Medications: macrobid (antibiotic), usual home medications 2. Treatment: rest, drink plenty of fluids 3. Follow Up: Please follow up with your primary doctor in 2 days for discussion of your diagnoses and further evaluation after today's visit; if you do not have a primary care doctor use the resource guide provided to find one; Please return to the ER for any new or worsening symptoms. Please obtain all of your results from medical records or have your doctors office obtain the results - share them with your doctor - you should be seen at your doctors office. Call today to arrange your follow up.   Take medications as prescribed. Please review all of the medicines and only take them if you do not have an allergy to them. Return to the emergency room for worsening condition or new concerning symptoms. Follow up with your regular doctor. If you don't have a regular doctor use one of the numbers below to establish a primary care doctor.  Please be aware that if you are taking birth control pills, taking other prescriptions, ESPECIALLY ANTIBIOTICS may make the birth control ineffective - if this is the case, either do not engage in sexual activity or use alternative methods of birth control such as condoms until you have finished the medicine and your family doctor says it is OK to restart them. If you are on a blood thinner such as COUMADIN, be aware that any other medicine that you take may cause the coumadin to either work too much, or not enough - you should have your coumadin level rechecked in next 7 days if this is the case.  ?  It is also a possibility that you have an allergic reaction to any of the medicines that you have been prescribed - Everybody reacts differently to medications and while MOST people have no trouble with most medicines, you may have a reaction such as nausea, vomiting, rash,  swelling, shortness of breath. If this is the case, please stop taking the medicine immediately and contact your physician.  ?  You should return to the ER if you develop severe or worsening symptoms.   Emergency Department Resource Guide 1) Find a Doctor and Pay Out of Pocket Although you won't have to find out who is covered by your insurance plan, it is a good idea to ask around and get recommendations. You will then need to call the office and see if the doctor you have chosen will accept you as a new patient and what types of options they offer for patients who are self-pay. Some doctors offer discounts or will set up payment plans for their patients who do not have insurance, but you will need to ask so you aren't surprised when you get to your appointment.  2) Contact Your Local Health Department Not all health departments have doctors that can see patients for sick visits, but many do, so it is worth a call to see if yours does. If you don't know where your local health department is, you can check in your phone book. The CDC also has a tool to help you locate your state's health department, and many state websites also have listings of all of their local health departments.  3) Find a [facility] If your illness is not likely to be very severe or complicated, you may want to try a walk in clinic. These are popping up  all over the country in pharmacies, drugstores, and shopping centers. They're usually staffed by nurse practitioners or physician assistants that have been trained to treat common illnesses and complaints. They're usually fairly quick and inexpensive. However, if you have serious medical issues or chronic medical problems, these are probably not your best option.  No Primary Care Doctor: Call Health Connect at  [phone] - they can help you locate a primary care doctor that  accepts your insurance, provides certain services, etc. Physician Referral Service-  [phone]  Chronic Pain Problems: Organization         Address  Phone   Notes  [facility]  [phone] Patients need to be referred by their primary care doctor.   Medication Assistance: Organization         Address  Phone   Notes  [facility] [address] [place], [place] [address] [phone] --Must be a resident of [facility] -- Must have NO insurance coverage whatsoever (no Medicaid/ Medicare, etc.) -- The pt. MUST have a primary care doctor that directs their care regularly and follows them in the community   [facility]  [phone]   [employer]  [phone]    Agencies that provide inexpensive medical care: Organization         Address  Phone   Notes  [place]  [phone]   [facility]    [phone]   [facility] [place], [place] [address] [phone]   [place] [address] [place] [phone]   [facility]    [phone]   [facility]    [phone]   [place] and [place] [address], [place] Phone:  [phone], Fax:  [phone] Hours of Operation:  9 am - 6 pm, M-F.  Also accepts Medicaid/Medicare and self-pay.  [facility] for [place] [address], [address], [place] Phone: [phone], Fax: [phone]. Hours of Operation:  8:30 am - 5:30 pm, M-F.  Also accepts Medicaid and self-pay.  [facility] [address] [place] Phone: [phone]   [place], [place], [place] [phone], Ext. [phone] Mondays & Thursdays: 7-9 AM.  First 15 patients are seen on a first come, first serve basis.    [place] Providers:  Organization         Address  Phone   Notes  [facility] [address],  [place] [phone] Also accepts self-pay patients.  [facility] [address], [place]  [phone]   [place], [address], [place] [phone]   [facility] [address] [place] [phone]   Lucianne Lei [address], [place]   [phone] Only accepts [place] [facility] [place] patients after they have their name applied to their card.   Self-Pay (no insurance) in [facility]:  Organization         Address  Phone   Notes  Sickle Cell Patients, [facility] [place] [phone]   [facility] [place] [phone]   [facility] [place]  [address], [place] [phone]   [facility]/Dr. Osei-Bonsu  [address], [place] or [address], [place] [phone] Phone number for both [place] and [place] locations is the same.  [facility] [address] [place] [phone]   [facility] [address], [place] or [address] [phone]   [facility] [address] [place] [phone], phone; [phone], fax Sees patients 1st and 3rd Saturday of every month.  Must not qualify for public or private insurance (i.e. Medicaid, Medicare, [place] Health Choice, Veterans' Benefits)  Household income should be no more than 200% of the poverty level The clinic cannot treat you if you are pregnant or think you are pregnant  Sexually transmitted diseases are not treated at the clinic.

## 2018-12-31 LAB — URINE CULTURE

## 2019-09-28 ENCOUNTER — Encounter (HOSPITAL_COMMUNITY): Payer: Self-pay | Admitting: Emergency Medicine

## 2019-09-28 ENCOUNTER — Emergency Department (HOSPITAL_COMMUNITY)
Admission: EM | Admit: 2019-09-28 | Discharge: 2019-09-29 | Discharge status: Against Medical Advice | Payer: Medicaid Other | Attending: Emergency Medicine | Admitting: Emergency Medicine

## 2019-09-28 ENCOUNTER — Other Ambulatory Visit: Payer: Self-pay

## 2019-09-28 DIAGNOSIS — Z5321 Procedure and treatment not carried out due to patient leaving prior to being seen by health care provider: Secondary | ICD-10-CM | POA: Insufficient documentation

## 2019-09-28 DIAGNOSIS — R1030 Lower abdominal pain, unspecified: Secondary | ICD-10-CM | POA: Diagnosis present

## 2019-09-28 LAB — COMPREHENSIVE METABOLIC PANEL
ALT: 15 U/L (ref 0–44)
AST: 18 U/L (ref 15–41)
Albumin: 3.9 g/dL (ref 3.5–5.0)
Alkaline Phosphatase: 75 U/L (ref 38–126)
Anion gap: 8 (ref 5–15)
BUN: 8 mg/dL (ref 6–20)
CO2: 23 mmol/L (ref 22–32)
Calcium: 9.3 mg/dL (ref 8.9–10.3)
Chloride: 106 mmol/L (ref 98–111)
Creatinine, Ser: 0.71 mg/dL (ref 0.44–1.00)
GFR calc Af Amer: >60 mL/min (ref >60)
GFR calc non Af Amer: >60 mL/min (ref >60)
Glucose, Bld: 103 mg/dL — ABNORMAL HIGH (ref 70–99)
Potassium: 4.1 mmol/L (ref 3.5–5.1)
Sodium: 137 mmol/L (ref 135–145)
Total Bilirubin: 0.4 mg/dL (ref 0.3–1.2)
Total Protein: 6.4 g/dL — ABNORMAL LOW (ref 6.5–8.1)

## 2019-09-28 LAB — CBC
HCT: 31.7 % — ABNORMAL LOW (ref 36.0–46.0)
Hemoglobin: 10.3 g/dL — ABNORMAL LOW (ref 12.0–15.0)
MCH: 28.4 pg (ref 26.0–34.0)
MCHC: 32.5 g/dL (ref 30.0–36.0)
MCV: 87.3 fL (ref 80.0–100.0)
Platelets: 316 10*3/uL (ref 150–400)
RBC: 3.63 MIL/uL — ABNORMAL LOW (ref 3.87–5.11)
RDW: 15.7 % — ABNORMAL HIGH (ref 11.5–15.5)
WBC: 6 10*3/uL (ref 4.0–10.5)
nRBC: 0 % (ref 0.0–0.2)

## 2019-09-28 LAB — LIPASE, BLOOD: Lipase: 24 U/L (ref 11–51)

## 2019-09-28 MED ORDER — SODIUM CHLORIDE 0.9% FLUSH: 3.0000 mL | Freq: Once | INTRAVENOUS | Status: DC

## 2019-09-28 NOTE — ED Triage Notes (Signed)
Pt reports lower abdominal pain that goes to her back X2 days.  More frequent urination and she believes it is her pancreatitis.

## 2019-09-29 ENCOUNTER — Emergency Department (HOSPITAL_COMMUNITY)
Admission: EM | Admit: 2019-09-29 | Discharge: 2019-09-29 | Disposition: A | Discharge status: Home / Self Care | Payer: Medicaid Other | Source: Home / Self Care | Attending: Emergency Medicine | Admitting: Emergency Medicine

## 2019-09-29 ENCOUNTER — Encounter (HOSPITAL_COMMUNITY): Payer: Self-pay | Admitting: Emergency Medicine

## 2019-09-29 DIAGNOSIS — F17210 Nicotine dependence, cigarettes, uncomplicated: Secondary | ICD-10-CM | POA: Insufficient documentation

## 2019-09-29 DIAGNOSIS — R1030 Lower abdominal pain, unspecified: Secondary | ICD-10-CM | POA: Insufficient documentation

## 2019-09-29 LAB — CBC
HCT: 34.6 % — ABNORMAL LOW (ref 36.0–46.0)
Hemoglobin: 10.9 g/dL — ABNORMAL LOW (ref 12.0–15.0)
MCH: 27.9 pg (ref 26.0–34.0)
MCHC: 31.5 g/dL (ref 30.0–36.0)
MCV: 88.7 fL (ref 80.0–100.0)
Platelets: 319 10*3/uL (ref 150–400)
RBC: 3.9 MIL/uL (ref 3.87–5.11)
RDW: 16.2 % — ABNORMAL HIGH (ref 11.5–15.5)
WBC: 5.7 10*3/uL (ref 4.0–10.5)
nRBC: 0 % (ref 0.0–0.2)

## 2019-09-29 LAB — WET PREP, GENITAL
Clue Cells Wet Prep HPF POC: NONE SEEN
Sperm: NONE SEEN
Trich, Wet Prep: NONE SEEN
Yeast Wet Prep HPF POC: NONE SEEN

## 2019-09-29 LAB — COMPREHENSIVE METABOLIC PANEL
ALT: 14 U/L (ref 0–44)
AST: 19 U/L (ref 15–41)
Albumin: 4 g/dL (ref 3.5–5.0)
Alkaline Phosphatase: 82 U/L (ref 38–126)
Anion gap: 7 (ref 5–15)
BUN: 9 mg/dL (ref 6–20)
CO2: 23 mmol/L (ref 22–32)
Calcium: 9.2 mg/dL (ref 8.9–10.3)
Chloride: 106 mmol/L (ref 98–111)
Creatinine, Ser: 0.44 mg/dL (ref 0.44–1.00)
GFR calc Af Amer: >60 mL/min (ref >60)
GFR calc non Af Amer: >60 mL/min (ref >60)
Glucose, Bld: 78 mg/dL (ref 70–99)
Potassium: 4 mmol/L (ref 3.5–5.1)
Sodium: 136 mmol/L (ref 135–145)
Total Bilirubin: 0.4 mg/dL (ref 0.3–1.2)
Total Protein: 7.1 g/dL (ref 6.5–8.1)

## 2019-09-29 LAB — URINALYSIS, ROUTINE W REFLEX MICROSCOPIC
Bilirubin Urine: NEGATIVE
Glucose, UA: NEGATIVE mg/dL
Hgb urine dipstick: NEGATIVE
Ketones, ur: NEGATIVE mg/dL
Nitrite: NEGATIVE
Protein, ur: NEGATIVE mg/dL
Specific Gravity, Urine: 1.024 (ref 1.005–1.030)
pH: 6 (ref 5.0–8.0)

## 2019-09-29 LAB — I-STAT BETA HCG BLOOD, ED (MC, WL, AP ONLY): I-stat hCG, quantitative: <5 m[IU]/mL (ref <5)

## 2019-09-29 LAB — LIPASE, BLOOD: Lipase: 23 U/L (ref 11–51)

## 2019-09-29 MED ORDER — ACETAMINOPHEN 500 MG PO TABS
1000.0000 mg | ORAL_TABLET | Freq: Once | ORAL | Status: AC
  Administered 2019-09-29: 1000 mg via ORAL
  Filled 2019-09-29: qty 2

## 2019-09-29 MED ORDER — KETOROLAC TROMETHAMINE 15 MG/ML IJ SOLN
15.0000 mg | Freq: Once | INTRAMUSCULAR | Status: AC
  Administered 2019-09-29: 15 mg via INTRAVENOUS
  Filled 2019-09-29: qty 1

## 2019-09-29 MED ORDER — SULFAMETHOXAZOLE-TRIMETHOPRIM 800-160 MG PO TABS: 1.0000 | ORAL_TABLET | Freq: Two times a day (BID) | ORAL | 0 refills | Status: AC

## 2019-09-29 MED ORDER — SODIUM CHLORIDE 0.9 % IV BOLUS
1000.0000 mL | Freq: Once | INTRAVENOUS | Status: AC
  Administered 2019-09-29: 1000 mL via INTRAVENOUS

## 2019-09-29 MED ORDER — DICYCLOMINE HCL 10 MG PO CAPS
10.0000 mg | ORAL_CAPSULE | Freq: Once | ORAL | Status: AC
  Administered 2019-09-29: 10 mg via ORAL
  Filled 2019-09-29: qty 1

## 2019-09-29 NOTE — ED Triage Notes (Signed)
Per pt, states she is having abdominal pain-was seen at [facility] yesterday for same-

## 2019-09-29 NOTE — ED Notes (Signed)
Called pt for recheck VS, no response.

## 2019-09-29 NOTE — ED Notes (Signed)
Dr Bero at bedside.  

## 2019-09-29 NOTE — ED Notes (Signed)
Pt now in room from triage.

## 2019-09-29 NOTE — ED Notes (Signed)
Pt ambulatory to and from bathroom with a steady gait. Urine sample obtained.

## 2019-09-29 NOTE — ED Provider Notes (Signed)
[facility] Emergency Department Provider Note MRN:  [id number]  Arrival date & time: 09/29/19     Chief Complaint   Abdominal Pain   History of Present Illness   Kristina Moyer is a 39 y.o. year-old female with a history of pancreatitis presenting to the ED with chief complaint of abdominal pain.  3 days of abdominal pain that is diffuse, worse in the suprapubic and periumbilical region.  Comes in waves, severe when at its worst.  Denies any other symptoms, no fever, no nausea, no vomiting, no diarrhea, no chest pain or shortness of breath, no vaginal bleeding or discharge.  No exacerbating or alleviating factors.  Review of Systems  A complete 10 system review of systems was obtained and all systems are negative except as noted in the HPI and PMH.   Patient's Health History    Past Medical History:  Diagnosis Date  . Alcoholism ([place])   . Pancreatitis, chronic ([place])     Past Surgical History:  Procedure Laterality Date  . TUBAL LIGATION      Family History  Family history unknown: Yes    Social History   Socioeconomic History  . Marital status: Single    Spouse name: Not on file  . Number of children: Not on file  . Years of education: Not on file  . Highest education level: Not on file  Occupational History  . Not on file  Tobacco Use  . Smoking status: Current Every Day Smoker    Packs/day: 1.00    Types: Cigarettes  . Smokeless tobacco: Never Used  Substance and Sexual Activity  . Alcohol use: Yes    Comment: occasional  . Drug use: No  . Sexual activity: Yes    Birth control/protection: Surgical, None  Other Topics Concern  . Not on file  Social History Narrative  . Not on file   Social Determinants of Health   Financial Resource Strain:   . Difficulty of Paying Living Expenses: Not on file  Food Insecurity:   . Worried About Charity fundraiser in the Last Year: Not on file  . Ran Out of Food in the Last Year: Not on file    Transportation Needs:   . Lack of Transportation (Medical): Not on file  . Lack of Transportation (Non-Medical): Not on file  Physical Activity:   . Days of Exercise per Week: Not on file  . Minutes of Exercise per Session: Not on file  Stress:   . Feeling of Stress : Not on file  Social Connections:   . Frequency of Communication with Friends and Family: Not on file  . Frequency of Social Gatherings with Friends and Family: Not on file  . Attends Religious Services: Not on file  . Active Member of Clubs or Organizations: Not on file  . Attends Archivist Meetings: Not on file  . Marital Status: Not on file  Intimate Partner Violence:   . Fear of Current or Ex-Partner: Not on file  . Emotionally Abused: Not on file  . Physically Abused: Not on file  . Sexually Abused: Not on file     Physical Exam   Vitals:   09/29/19 1359 09/29/19 1625  BP: 125/80 107/75  Pulse: (!) 102 73  Resp: 16 16  Temp: 98.3 F (36.8 C)   SpO2: 100% 100%    CONSTITUTIONAL: Well-appearing, NAD NEURO:  Alert and oriented x 3, no focal deficits EYES:  eyes equal and reactive  ENT/NECK:  no LAD, no JVD CARDIO: Regular rate, well-perfused, normal S1 and S2 PULM:  CTAB no wheezing or rhonchi GI/GU:  normal bowel sounds, non-distended, mild left lower quadrant tenderness to palpation MSK/SPINE:  No gross deformities, no edema SKIN:  no rash, atraumatic PSYCH:  Appropriate speech and behavior  *Additional and/or pertinent findings included in MDM below  Diagnostic and Interventional Summary    EKG Interpretation  Date/Time:    Ventricular Rate:    PR Interval:    QRS Duration:   QT Interval:    QTC Calculation:   R Axis:     Text Interpretation:        Cardiac Monitoring Interpretation:  Labs Reviewed  WET PREP, GENITAL - Abnormal; Notable for the following components:      Result Value   WBC, Wet Prep HPF POC RARE (*)    All other components within normal limits  CBC -  Abnormal; Notable for the following components:   Hemoglobin 10.9 (*)    HCT 34.6 (*)    RDW 16.2 (*)    All other components within normal limits  URINALYSIS, ROUTINE W REFLEX MICROSCOPIC - Abnormal; Notable for the following components:   APPearance HAZY (*)    Leukocytes,Ua SMALL (*)    Bacteria, UA RARE (*)    All other components within normal limits  COMPREHENSIVE METABOLIC PANEL  LIPASE, BLOOD  I-STAT BETA HCG BLOOD, ED ([facility], [facility], [facility] ONLY)  GC/CHLAMYDIA PROBE AMP () NOT AT [facility]    No orders to display    Medications  acetaminophen (TYLENOL) tablet 1,000 mg (1,000 mg Oral Given 09/29/19 1626)  dicyclomine (BENTYL) capsule 10 mg (10 mg Oral Given 09/29/19 1626)  sodium chloride 0.9 % bolus 1,000 mL (1,000 mLs Intravenous New Bag/Given 09/29/19 1713)  ketorolac (TORADOL) 15 MG/ML injection 15 mg (15 mg Intravenous Given 09/29/19 1714)     Procedures  /  Critical Care Procedures  ED Course and Medical Decision Making  I have reviewed the triage vital signs, the nursing notes, and pertinent available records from the EMR.  Pertinent labs & imaging results that were available during my care of the patient were reviewed by me and considered in my medical decision making (see below for details).     Suspect ovarian cyst on the left given the focal tenderness on exam.  Also considering pancreatitis given patient's history.  Patient has no upper quadrant tenderness, no right lower quadrant tenderness, doubt appendicitis.  Also considering UTI, work-up is pending.  Pelvic exam to follow.  Pelvic exam is largely reassuring, some white discharge in the vault however swabs are generally normal.  Urinalysis with some evidence to suggest infection.  Patient is feeling much better after Toradol, reassuring exam, again doubt emergent process, appropriate for discharge.  [name]. Pilar Plate, MD [facility] Emergency Medicine [facility] [email or web address]  Final Clinical  Impressions(s) / ED Diagnoses     ICD-10-CM   1. Lower abdominal pain  R10.30     ED Discharge Orders         Ordered    sulfamethoxazole-trimethoprim (BACTRIM DS) 800-160 MG tablet  2 times daily     09/29/19 1828           Discharge Instructions Discussed with and Provided to Patient:     Discharge Instructions     You were evaluated in the Emergency Department and after careful evaluation, we did not find any emergent condition requiring admission or further testing  in the hospital.  Your exam/testing today was overall reassuring.  Your symptoms seem to be due to a urinary tract infection.  Please take the antibiotics as directed and use ibuprofen for discomfort, 600 mg every 4-6 hours.  Please return to the Emergency Department if you experience any worsening of your condition.  We encourage you to follow up with a primary care provider.  Thank you for allowing [place] to be a part of your care.        Sabas Sous, MD 09/29/19 [phone]

## 2019-09-29 NOTE — ED Notes (Signed)
Assisted Dr. Pilar Plate with pelvic exam. Pt tolerated well. Pt requesting more pain medication and IV fluid. Bero aware.

## 2019-09-29 NOTE — Discharge Instructions (Addendum)
You were evaluated in the Emergency Department and after careful evaluation, we did not find any emergent condition requiring admission or further testing in the hospital.  Your exam/testing today was overall reassuring.  Your symptoms seem to be due to a urinary tract infection.  Please take the antibiotics as directed and use ibuprofen for discomfort, 600 mg every 4-6 hours.  Please return to the Emergency Department if you experience any worsening of your condition.  We encourage you to follow up with a primary care provider.  Thank you for allowing [place] to be a part of your care.

## 2019-10-01 LAB — GC/CHLAMYDIA PROBE AMP (~~LOC~~) NOT AT ARMC
Chlamydia: POSITIVE — AB
Neisseria Gonorrhea: NEGATIVE

## 2019-10-03 ENCOUNTER — Other Ambulatory Visit: Payer: Self-pay

## 2019-10-03 MED ORDER — AZITHROMYCIN 250 MG PO TABS: 1000.0000 mg | ORAL_TABLET | Freq: Once | ORAL | 0 refills | Status: AC

## 2020-03-08 ENCOUNTER — Encounter (HOSPITAL_COMMUNITY): Payer: Self-pay

## 2020-03-08 ENCOUNTER — Ambulatory Visit (HOSPITAL_COMMUNITY)
Admission: EM | Admit: 2020-03-08 | Discharge: 2020-03-08 | Disposition: A | Discharge status: Home / Self Care | Payer: Medicaid Other | Attending: Urgent Care | Admitting: Urgent Care

## 2020-03-08 DIAGNOSIS — J3489 Other specified disorders of nose and nasal sinuses: Secondary | ICD-10-CM

## 2020-03-08 DIAGNOSIS — J029 Acute pharyngitis, unspecified: Secondary | ICD-10-CM

## 2020-03-08 DIAGNOSIS — R519 Headache, unspecified: Secondary | ICD-10-CM

## 2020-03-08 DIAGNOSIS — F17210 Nicotine dependence, cigarettes, uncomplicated: Secondary | ICD-10-CM | POA: Insufficient documentation

## 2020-03-08 DIAGNOSIS — Z20822 Contact with and (suspected) exposure to covid-19: Secondary | ICD-10-CM | POA: Insufficient documentation

## 2020-03-08 DIAGNOSIS — B349 Viral infection, unspecified: Secondary | ICD-10-CM | POA: Diagnosis not present

## 2020-03-08 DIAGNOSIS — F17200 Nicotine dependence, unspecified, uncomplicated: Secondary | ICD-10-CM

## 2020-03-08 DIAGNOSIS — R05 Cough: Secondary | ICD-10-CM | POA: Insufficient documentation

## 2020-03-08 DIAGNOSIS — R059 Cough, unspecified: Secondary | ICD-10-CM

## 2020-03-08 LAB — POCT RAPID STREP A, ED / UC: Streptococcus, Group A Screen (Direct): NEGATIVE

## 2020-03-08 MED ORDER — ONDANSETRON HCL 4 MG/2ML IJ SOLN
INTRAMUSCULAR | Status: AC
  Filled 2020-03-08: qty 2

## 2020-03-08 MED ORDER — KETOROLAC TROMETHAMINE 60 MG/2ML IM SOLN
INTRAMUSCULAR | Status: AC
  Filled 2020-03-08: qty 2

## 2020-03-08 MED ORDER — CETIRIZINE HCL 10 MG PO TABS: 10.0000 mg | ORAL_TABLET | Freq: Every day | ORAL | 0 refills | Status: DC

## 2020-03-08 MED ORDER — ONDANSETRON HCL 4 MG/2ML IJ SOLN
4.0000 mg | Freq: Once | INTRAMUSCULAR | Status: AC
  Administered 2020-03-08: 4 mg via INTRAMUSCULAR

## 2020-03-08 MED ORDER — KETOROLAC TROMETHAMINE 60 MG/2ML IM SOLN
60.0000 mg | Freq: Once | INTRAMUSCULAR | Status: AC
  Administered 2020-03-08: 60 mg via INTRAMUSCULAR

## 2020-03-08 MED ORDER — PSEUDOEPHEDRINE HCL 60 MG PO TABS: 60.0000 mg | ORAL_TABLET | Freq: Three times a day (TID) | ORAL | 0 refills | Status: DC | PRN

## 2020-03-08 MED ORDER — NAPROXEN 500 MG PO TABS
500.0000 mg | ORAL_TABLET | Freq: Two times a day (BID) | ORAL | 0 refills | Status: DC
Start: 2020-03-08 — End: 2021-03-04

## 2020-03-08 MED ORDER — BENZONATATE 100 MG PO CAPS: 100.0000 mg | ORAL_CAPSULE | Freq: Three times a day (TID) | ORAL | 0 refills | Status: DC | PRN

## 2020-03-08 MED ORDER — PROMETHAZINE-DM 6.25-15 MG/5ML PO SYRP: 5.0000 mL | ORAL_SOLUTION | Freq: Every evening | ORAL | 0 refills | Status: DC | PRN

## 2020-03-08 NOTE — ED Triage Notes (Signed)
Pt c/o sore throat, dysphagiax2 days. Pt c/o HA and vomiting today. Pt denies blurred vision and photophobia.

## 2020-03-08 NOTE — ED Provider Notes (Signed)
[facility]   MRN: [id number] DOB: March 26, 1981  Subjective:   Kristina Moyer is a 39 y.o. female presenting for 2 day hx of acute onset throat pain, painful swallowing, chills and body aches.  Started headache moderate to severe headache, photophobia and had an episode of vomiting today. Has not had COVID vaccination.  Denies cough, chest pain, shortness of breath, nausea, belly pain, rashes.  Denies taking chronic medications.  No Known Allergies  Past Medical History:  Diagnosis Date  . Alcoholism ([facility])   . Pancreatitis, chronic ([facility])      Past Surgical History:  Procedure Laterality Date  . TUBAL LIGATION      Family History  Family history unknown: Yes    Social History   Tobacco Use  . Smoking status: Current Every Day Smoker    Packs/day: 1.00    Types: Cigarettes  . Smokeless tobacco: Never Used  Substance Use Topics  . Alcohol use: Yes    Comment: occasional  . Drug use: No    ROS   Objective:   Vitals: BP 119/75 (BP Location: Right Arm)   Pulse 67   Temp 98.3 F (36.8 C) (Oral)   Resp 18   Ht 5\' 9"  (1.753 m)   Wt 120 lb (54.4 kg)   SpO2 99%   BMI 17.72 kg/m   99% pulse oximetry, 67 pulse.   Physical Exam Constitutional:      General: She is not in acute distress.    Appearance: Normal appearance. She is well-developed. She is ill-appearing. She is not toxic-appearing or diaphoretic.  HENT:     Head: Normocephalic and atraumatic.     Right Ear: Tympanic membrane and ear canal normal. No drainage or tenderness. No middle ear effusion. Tympanic membrane is not erythematous.     Left Ear: Tympanic membrane and ear canal normal. No drainage or tenderness.  No middle ear effusion. Tympanic membrane is not erythematous.     Nose: Congestion and rhinorrhea present.     Mouth/Throat:     Mouth: Mucous membranes are moist. No oral lesions.     Pharynx: Oropharynx is clear. No pharyngeal swelling, oropharyngeal exudate, posterior  oropharyngeal erythema or uvula swelling.     Tonsils: No tonsillar exudate or tonsillar abscesses.  Eyes:     General: No scleral icterus.       Right eye: No discharge.        Left eye: No discharge.     Extraocular Movements: Extraocular movements intact.     Right eye: Normal extraocular motion.     Left eye: Normal extraocular motion.     Conjunctiva/sclera: Conjunctivae normal.     Pupils: Pupils are equal, round, and reactive to light.  Cardiovascular:     Rate and Rhythm: Normal rate and regular rhythm.     Pulses: Normal pulses.     Heart sounds: Normal heart sounds. No murmur heard.  No friction rub. No gallop.   Pulmonary:     Effort: Pulmonary effort is normal. No respiratory distress.     Breath sounds: Normal breath sounds. No stridor. No wheezing, rhonchi or rales.  Musculoskeletal:     Cervical back: Normal range of motion and neck supple.  Lymphadenopathy:     Cervical: No cervical adenopathy.  Skin:    General: Skin is warm and dry.     Findings: No rash.  Neurological:     General: No focal deficit present.     Mental Status: She is  alert and oriented to person, place, and time.     Cranial Nerves: No cranial nerve deficit.     Motor: No weakness.     Coordination: Coordination normal.     Gait: Gait normal.     Deep Tendon Reflexes: Reflexes normal.     Comments: Negative Kernig and Brudzinski.  Psychiatric:        Mood and Affect: Mood normal.        Behavior: Behavior normal.        Thought Content: Thought content normal.        Judgment: Judgment normal.     Results for orders placed or performed during the hospital encounter of 03/08/20 (from the past 24 hour(s))  POCT Rapid Strep A     Status: None   Collection Time: 03/08/20  8:36 PM  Result Value Ref Range   Streptococcus, Group A Screen (Direct) NEGATIVE NEGATIVE    Assessment and Plan :   PDMP not reviewed this encounter.  1. Viral illness   2. Acute nonintractable headache,  unspecified headache type   3. Stuffy and runny nose   4. Smoker   5. Cough     Will manage for viral illness such as viral URI, viral syndrome, viral rhinitis, COVID-19. Counseled patient on nature of COVID-19 including modes of transmission, diagnostic testing, management and supportive care.  Offered scripts for symptomatic relief. COVID 19 testing is pending. Counseled patient on potential for adverse effects with medications prescribed/recommended today, ER and return-to-clinic precautions discussed, patient verbalized understanding.     Wallis Bamberg, [place] 03/09/20 [phone]

## 2020-03-08 NOTE — Discharge Instructions (Addendum)
Naproxen for aches and pains. Use ondansetron for nausea and vomiting.   We will notify you of your COVID-19 test results as they arrive and may take between 24 to 48 hours.  I encourage you to sign up for MyChart if you have not already done so as this can be the easiest way for [place] to communicate results to you online or through a phone app.  In the meantime, if you develop worsening symptoms including fever, chest pain, shortness of breath despite our current treatment plan then please report to the emergency room as this may be a sign of worsening status from possible COVID-19 infection.  Otherwise, we will manage this as a viral syndrome. For sore throat or cough try using a honey-based tea. Use 3 teaspoons of honey with juice squeezed from half lemon. Place shaved pieces of ginger into 1/2-1 cup of water and warm over stove top. Then mix the ingredients and repeat every 4 hours as needed. Please take Tylenol 500mg -650mg  every 6 hours for aches and pains, fevers. Hydrate very well with at least 2 liters of water. Eat light meals such as soups to replenish electrolytes and soft fruits, veggies. Start an antihistamine like Zyrtec, Allegra or Claritin for postnasal drainage, sinus congestion.  You can take this together with pseudoephedrine (Sudafed) at a dose of 60 mg 2-3 times a day as needed for the same kind of congestion.

## 2020-03-09 ENCOUNTER — Encounter (HOSPITAL_COMMUNITY): Payer: Self-pay | Admitting: Urgent Care

## 2020-03-09 LAB — SARS CORONAVIRUS 2 (TAT 6-24 HRS): SARS Coronavirus 2: NEGATIVE

## 2020-03-11 LAB — CULTURE, GROUP A STREP (THRC)

## 2020-05-28 ENCOUNTER — Other Ambulatory Visit: Payer: Self-pay

## 2020-05-28 ENCOUNTER — Ambulatory Visit (HOSPITAL_COMMUNITY)
Admission: EM | Admit: 2020-05-28 | Discharge: 2020-05-28 | Disposition: A | Discharge status: Home / Self Care | Payer: Medicaid Other | Attending: Family Medicine | Admitting: Family Medicine

## 2020-05-28 DIAGNOSIS — Z20822 Contact with and (suspected) exposure to covid-19: Secondary | ICD-10-CM | POA: Insufficient documentation

## 2020-05-28 DIAGNOSIS — R439 Unspecified disturbances of smell and taste: Secondary | ICD-10-CM | POA: Diagnosis present

## 2020-05-28 LAB — SARS CORONAVIRUS 2 (TAT 6-24 HRS): SARS Coronavirus 2: POSITIVE — AB

## 2020-05-28 LAB — POC URINE PREG, ED: Preg Test, Ur: NEGATIVE

## 2020-05-28 NOTE — ED Triage Notes (Signed)
Pt reports loss of smell and taste that started 2 days ago. t has been taking cold Meds at home. Pt has cough non -productive  Pt denies ABD pain N/V/D.

## 2020-05-28 NOTE — ED Provider Notes (Addendum)
[facility]   [id number] 05/28/20 Arrival Time: 1242  ASSESSMENT & PLAN:  1. Exposure to COVID-19 virus   2. Disturbances of sensation of smell and taste      COVID-19 testing sent. See letter/work note on file for self-isolation guidelines. OTC symptom care as needed.    Follow-up Information    [facility].   Specialty: Urgent Care Why: As needed. Contact information: [address] [place] [address] [phone]              Reviewed expectations re: course of current medical issues. Questions answered. Outlined signs and symptoms indicating need for more acute intervention. Understanding verbalized. After Visit Summary given.   SUBJECTIVE: History from: patient. Kristina Moyer is a 39 y.o. female who presents with worries regarding COVID-19. Known COVID-19 contact: reports recent exposure. Recent travel: none. Reports: disturbance in smell for a couple of days. Mild dry cough. Denies: fever, difficulty breathing. Normal PO intake without n/v/d.    OBJECTIVE:  Vitals:   05/28/20 1254 05/28/20 1257  BP: 110/65   Pulse: 85   Resp: 20   Temp: 98.6 F (37 C)   TempSrc: Oral   SpO2: 100%   Weight:  54.4 kg  Height:  5\' 5"  (1.651 m)    General appearance: alert; no distress Eyes: PERRLA; EOMI; conjunctiva normal HENT: [place]; AT; without nasal congestion Neck: supple  Lungs: speaks full sentences without difficulty; unlabored Extremities: no edema Skin: warm and dry Neurologic: normal gait Psychological: alert and cooperative; normal mood and affect  Labs: Results for orders placed or performed during the hospital encounter of 05/28/20  POC urine preg, ED (not at [facility])  Result Value Ref Range   Preg Test, Ur NEGATIVE NEGATIVE   Labs Reviewed  SARS CORONAVIRUS 2 (TAT 6-24 HRS)  POC URINE PREG, ED     No Known Allergies  Past Medical History:  Diagnosis Date  . Alcoholism ([facility])   .  Pancreatitis, chronic ([facility])    Social History   Socioeconomic History  . Marital status: Single    Spouse name: Not on file  . Number of children: Not on file  . Years of education: Not on file  . Highest education level: Not on file  Occupational History  . Not on file  Tobacco Use  . Smoking status: Current Every Day Smoker    Packs/day: 1.00    Types: Cigarettes  . Smokeless tobacco: Never Used  Substance and Sexual Activity  . Alcohol use: Yes    Comment: occasional  . Drug use: No  . Sexual activity: Yes    Birth control/protection: Surgical, None  Other Topics Concern  . Not on file  Social History Narrative  . Not on file   Social Determinants of Health   Financial Resource Strain:   . Difficulty of Paying Living Expenses: Not on file  Food Insecurity:   . Worried About [facility] in the Last Year: Not on file  . Ran Out of Food in the Last Year: Not on file  Transportation Needs:   . Lack of Transportation (Medical): Not on file  . Lack of Transportation (Non-Medical): Not on file  Physical Activity:   . Days of Exercise per Week: Not on file  . Minutes of Exercise per Session: Not on file  Stress:   . Feeling of Stress : Not on file  Social Connections:   . Frequency of Communication with Friends  and Family: Not on file  . Frequency of Social Gatherings with Friends and Family: Not on file  . Attends Religious Services: Not on file  . Active Member of Clubs or Organizations: Not on file  . Attends Banker Meetings: Not on file  . Marital Status: Not on file  Intimate Partner Violence:   . Fear of Current or Ex-Partner: Not on file  . Emotionally Abused: Not on file  . Physically Abused: Not on file  . Sexually Abused: Not on file   Family History  Family history unknown: Yes   Past Surgical History:  Procedure Laterality Date  . TUBAL LIGATION       Mardella Layman, MD 05/28/20 1327    Mardella Layman, MD 05/28/20  1327

## 2020-05-28 NOTE — Discharge Instructions (Signed)
You have been tested for COVID-19 today. °If your test returns positive, you will receive a phone call from [facility] regarding your results. °Negative test results are not called. °Both positive and negative results area always visible on MyChart. °If you do not have a MyChart account, sign up instructions are provided in your discharge papers. °Please do not hesitate to contact us should you have questions or concerns. ° °

## 2020-05-29 ENCOUNTER — Telehealth: Payer: Self-pay | Admitting: Infectious Diseases

## 2020-05-29 NOTE — Telephone Encounter (Signed)
Called to Discuss with patient about Covid symptoms and the use of the monoclonal antibody infusion for those with mild to moderate Covid symptoms and at a high risk of hospitalization.     Pt appears to qualify for this infusion due to co-morbid conditions and/or a member of an at-risk group in accordance with the FDA Emergency Use Authorization.   Qualified for treatment with SVI risk 4 and current daily smoker.  Sx started ~11/04 Scheduled for Monday 11/08 @11 :30 am   , MSN, NP-C [facility]  [email or web address] Pager: [phone] Office: [phone] [facility] Main Line: [phone]

## 2020-05-30 ENCOUNTER — Other Ambulatory Visit: Payer: Self-pay | Admitting: Physician Assistant

## 2020-05-30 ENCOUNTER — Ambulatory Visit (HOSPITAL_COMMUNITY)
Admission: RE | Admit: 2020-05-30 | Discharge: 2020-05-30 | Disposition: A | Discharge status: Home / Self Care | Payer: Medicaid Other | Source: Ambulatory Visit | Attending: Pulmonary Disease | Admitting: Pulmonary Disease

## 2020-05-30 DIAGNOSIS — U071 COVID-19: Secondary | ICD-10-CM | POA: Insufficient documentation

## 2020-05-30 DIAGNOSIS — K860 Alcohol-induced chronic pancreatitis: Secondary | ICD-10-CM

## 2020-05-30 DIAGNOSIS — Z720 Tobacco use: Secondary | ICD-10-CM | POA: Insufficient documentation

## 2020-05-30 MED ORDER — SOTROVIMAB 500 MG/8ML IV SOLN
500.0000 mg | Freq: Once | INTRAVENOUS | Status: AC
  Administered 2020-05-30: 500 mg via INTRAVENOUS

## 2020-05-30 MED ORDER — FAMOTIDINE IN NACL 20-0.9 MG/50ML-% IV SOLN: 20.0000 mg | Freq: Once | INTRAVENOUS | Status: DC | PRN

## 2020-05-30 MED ORDER — EPINEPHRINE 0.3 MG/0.3ML IJ SOAJ: 0.3000 mg | Freq: Once | INTRAMUSCULAR | Status: DC | PRN

## 2020-05-30 MED ORDER — ALBUTEROL SULFATE HFA 108 (90 BASE) MCG/ACT IN AERS: 2.0000 | INHALATION_SPRAY | Freq: Once | RESPIRATORY_TRACT | Status: DC | PRN

## 2020-05-30 MED ORDER — METHYLPREDNISOLONE SODIUM SUCC 125 MG IJ SOLR: 125.0000 mg | Freq: Once | INTRAMUSCULAR | Status: DC | PRN

## 2020-05-30 MED ORDER — SODIUM CHLORIDE 0.9 % IV SOLN: INTRAVENOUS | Status: DC | PRN

## 2020-05-30 MED ORDER — DIPHENHYDRAMINE HCL 50 MG/ML IJ SOLN: 50.0000 mg | Freq: Once | INTRAMUSCULAR | Status: DC | PRN

## 2020-05-30 NOTE — Progress Notes (Signed)
  Diagnosis: COVID-19  Physician: Dr. Wright  Procedure: Covid Infusion Clinic Med: sotrovimab infusion - Provided patient with sotrovimab fact sheet for patients, parents and caregivers prior to infusion.  Complications: No immediate complications noted.  Discharge: Discharged home   Kira Hartl Richards 05/30/2020   

## 2020-05-30 NOTE — Progress Notes (Deleted)
  Diagnosis: COVID-19  Physician: Dr. Delford Field   Procedure: Sotrovimab infusion- Provided patient with Sotrovimab fact sheet for patients, parents and caregivers prior to infusion.   Complications: No immediate complications noted.  Discharge: Discharged home   Edmon Crape 05/30/2020

## 2020-05-30 NOTE — Progress Notes (Signed)
I connected by phone with Kristina Moyer on 05/30/2020 at 12:56 PM to discuss the potential use of a new treatment for mild to moderate COVID-19 viral infection in non-hospitalized patients.  This patient is a 39 y.o. female that meets the FDA criteria for Emergency Use Authorization of COVID monoclonal antibody sotrovimab, casirivimab/imdevimab or bamlamivimab/estevimab.  Has a (+) direct SARS-CoV-2 viral test result  Has mild or moderate COVID-19   Is NOT hospitalized due to COVID-19  Is within 10 days of symptom onset  Has at least one of the high risk factor(s) for progression to severe COVID-19 and/or hospitalization as defined in EUA.  Specific high risk criteria : Other high risk medical condition per CDC:  daily tobacco abuse and high SVI   I have spoken and communicated the following to the patient or parent/caregiver regarding COVID monoclonal antibody treatment:  1. FDA has authorized the emergency use for the treatment of mild to moderate COVID-19 in adults and pediatric patients with positive results of direct SARS-CoV-2 viral testing who are 16 years of age and older weighing at least 40 kg, and who are at high risk for progressing to severe COVID-19 and/or hospitalization.  2. The significant known and potential risks and benefits of COVID monoclonal antibody, and the extent to which such potential risks and benefits are unknown.  3. Information on available alternative treatments and the risks and benefits of those alternatives, including clinical trials.  4. Patients treated with COVID monoclonal antibody should continue to self-isolate and use infection control measures (e.g., wear mask, isolate, social distance, avoid sharing personal items, clean and disinfect "high touch" surfaces, and frequent handwashing) according to CDC guidelines.   5. The patient or parent/caregiver has the option to accept or refuse COVID monoclonal antibody treatment.  After reviewing this  information with the patient, the patient has agreed to receive one of the available covid 19 monoclonal antibodies and will be provided an appropriate fact sheet prior to infusion.  Original consent done by Rexene Alberts, but I did place the orders.   [name] 05/30/2020 12:56 PM

## 2020-05-30 NOTE — Discharge Instructions (Signed)

## 2020-07-25 IMAGING — CT CT ABDOMEN AND PELVIS WITH CONTRAST
2 of 4 series · 15 of 46 positions shown, 17 images · IV contrast (APPLIED)
Comparison: 04/08/2015 ultrasound. Most recent CT 12/23/2014.

CLINICAL DATA: Epigastric pain with radiation to chest and back.
Symptoms for 3 days. History of alcohol-induced pancreatitis.
Alcohol abuse

EXAM:
CT ABDOMEN AND PELVIS WITH CONTRAST
TECHNIQUE: Multidetector CT imaging of the abdomen and pelvis was performed
using the standard protocol following bolus administration of
intravenous contrast.
CONTRAST:  100mL OMNIPAQUE IOHEXOL 300 MG/ML  SOLN

[Series 3: abdomen 5.0 · axial · 0.87mm/px · z∈[+678,+1088]mm · 12 of 92 slices shown, 14 images]
[im 5/92  soft-tissue]
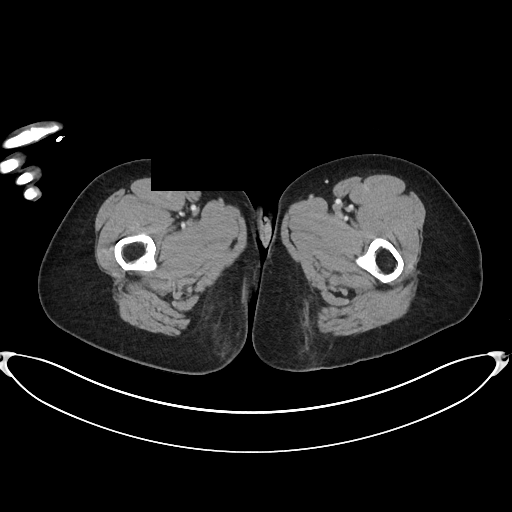
[im 5/92  bone]
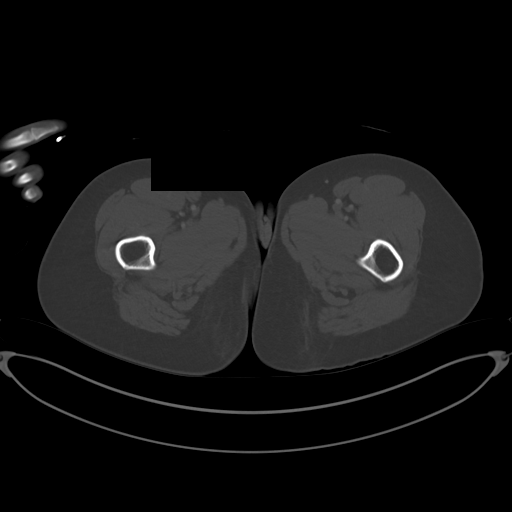
[im 15/92  soft-tissue]
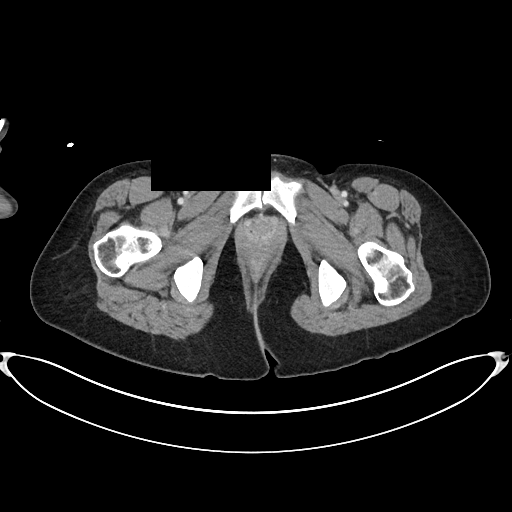
[im 20/92  soft-tissue]
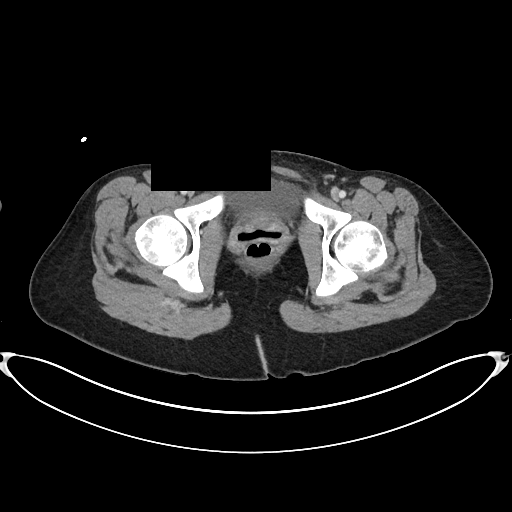
[im 29/92  soft-tissue]
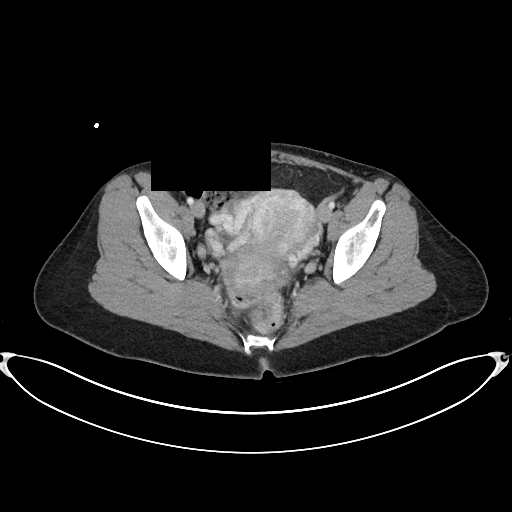
[im 34/92  soft-tissue]
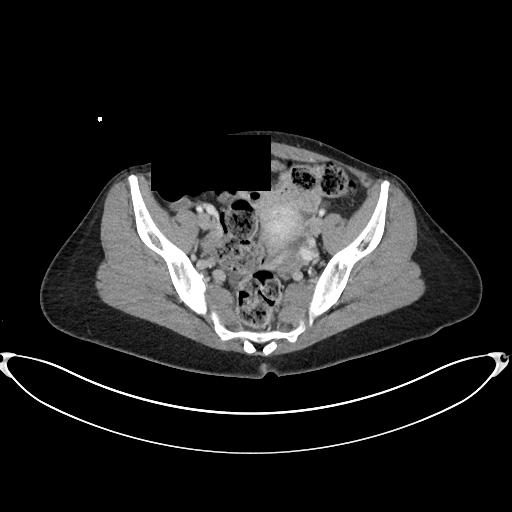
[im 44/92  soft-tissue]
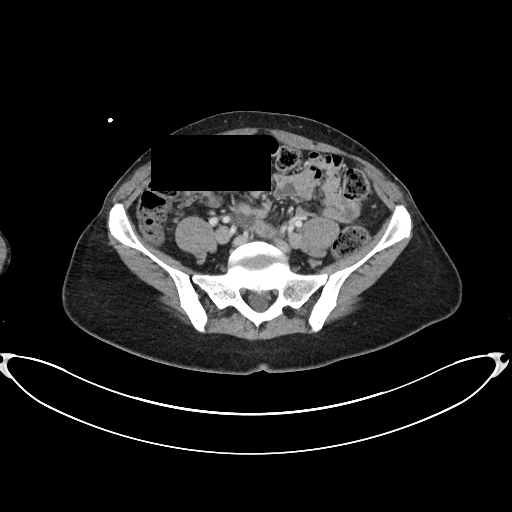
[im 48/92  soft-tissue]
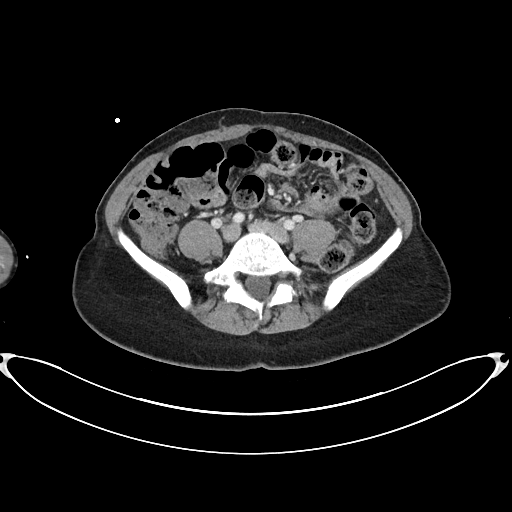
[im 58/92  soft-tissue]
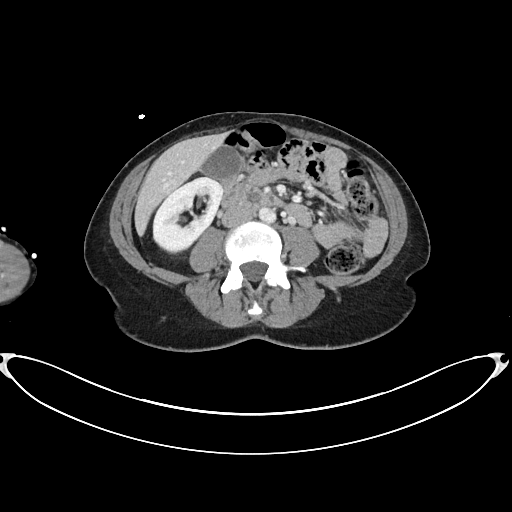
[im 63/92  soft-tissue]
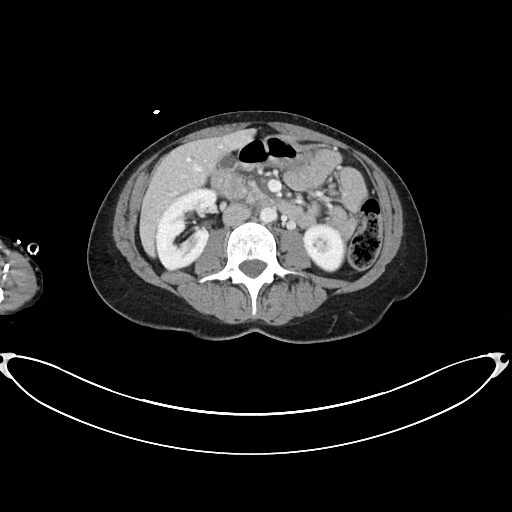
[im 63/92  bone]
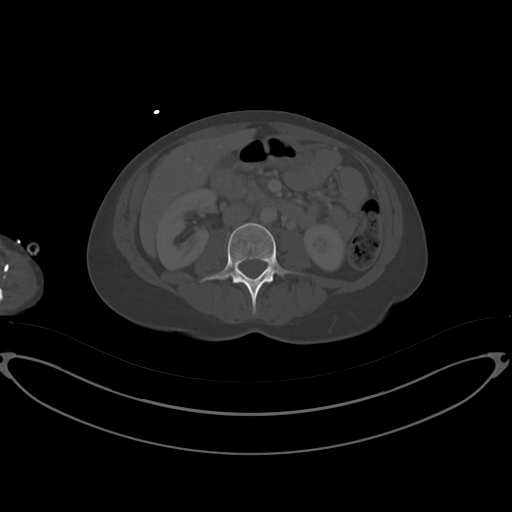
[im 72/92  soft-tissue]
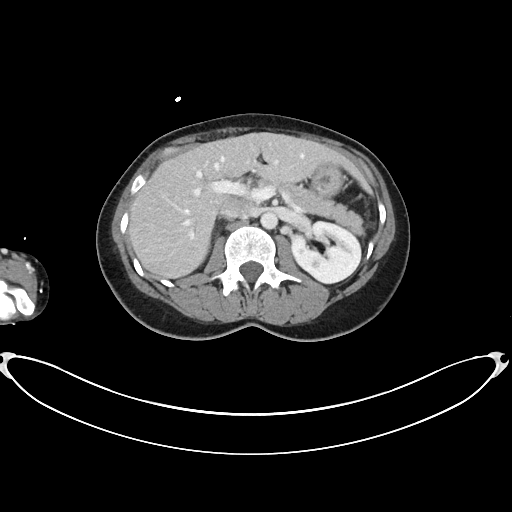
[im 77/92  soft-tissue]
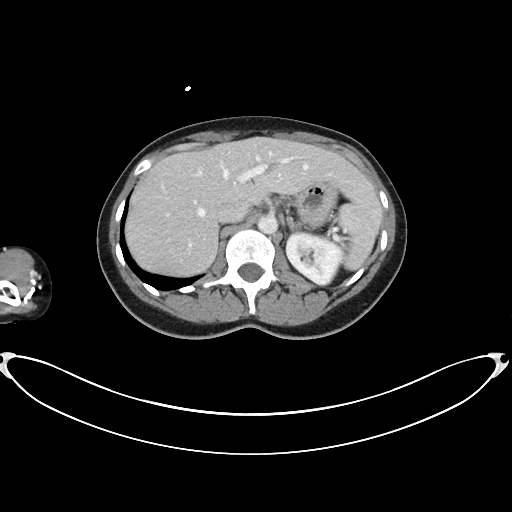
[im 87/92  soft-tissue]
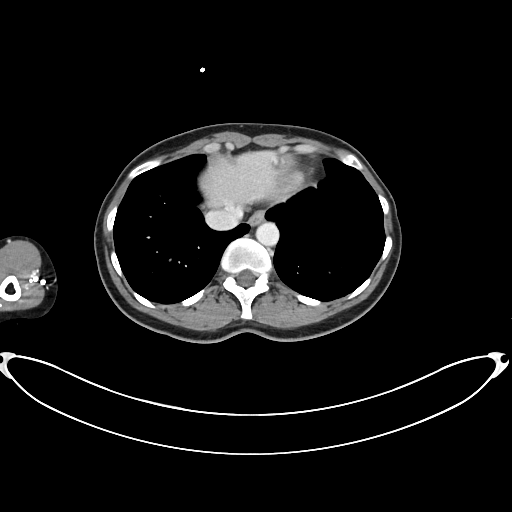

[Series 6: abdomen 3.0 mpr cor · coronal · 0.67mm/px · 3 of 91 slices shown]
[im 31/91  soft-tissue]
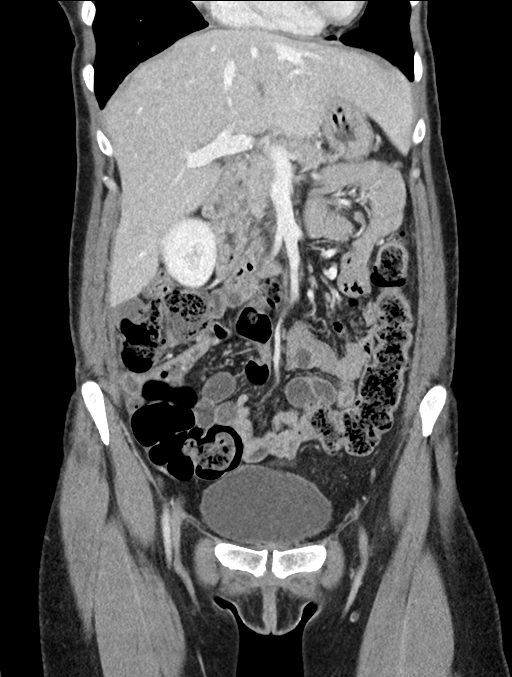
[im 41/91  soft-tissue]
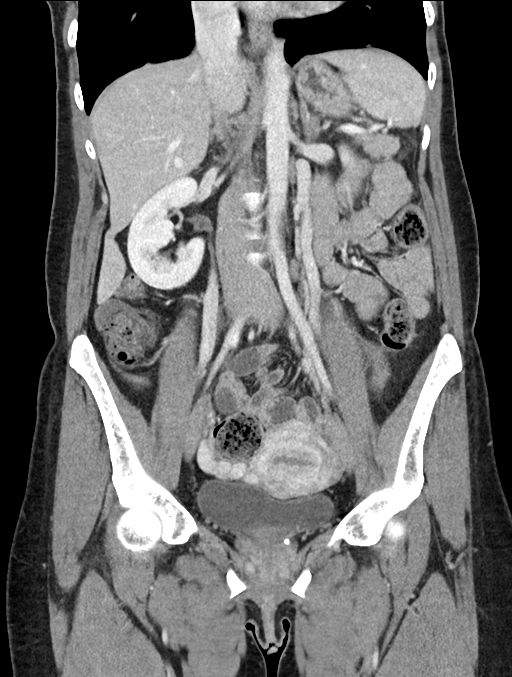
[im 51/91  soft-tissue]
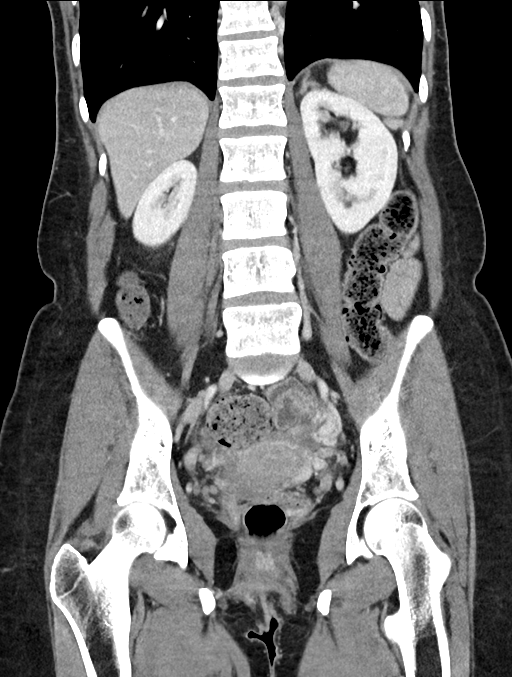

[15 of 46 positions shown; findings below may reference images not displayed]

FINDINGS: Lower chest: Clear lung bases. Normal heart size without pericardial
or pleural effusion.

Hepatobiliary: Prominent lateral segment left liver lobe. High left
hepatic lobe probable hemangioma including at 1.2 cm. Present back
in 6033. Normal gallbladder, without biliary ductal dilatation.

Pancreas: Suspect mild peripancreatic edema adjacent the head and
uncinate process, including on image [DATE]. No pancreatic duct
dilatation or well-defined peripancreatic fluid collection.

Spleen: Normal in size, without focal abnormality.

Adrenals/Urinary Tract: Normal adrenal glands. Right renal too small
to characterize lesion. Normal left kidney. Normal urinary bladder.

Stomach/Bowel: Normal stomach, without wall thickening. Colonic
stool burden suggests constipation. Normal terminal ileum and
appendix. Normal small bowel.

Vascular/Lymphatic: Mild but markedly age advanced aortic
atherosclerosis. No abdominopelvic adenopathy.

Reproductive: Normal uterus and adnexa. Prominent gonadal veins.

Other: Trace free pelvic fluid is likely physiologic. No abdominal
ascites. Mild ventral abdominal wall laxity.

Musculoskeletal: No acute osseous abnormality.
IMPRESSION: 1. Suspicion of mild uncomplicated pancreatitis.
2. Prominent gonadal veins, as can be seen with pelvic congestion
syndrome.
3. Possible constipation.
4. Prominent lateral segment left liver lobe. Cannot exclude mild
cirrhosis in this patient with a history of alcohol abuse.

Aortic Atherosclerosis (UP20G-MBO.O). This is markedly age advanced.

## 2020-09-17 IMAGING — US ULTRASOUND ABDOMEN LIMITED
1 series · 14 of 25 positions shown · non-contrast
Comparison: CT Abdomen and Pelvis 11/06/2018.

CLINICAL DATA: 37-year-old female with 2 days of right upper
quadrant pain.

EXAM:
ULTRASOUND ABDOMEN LIMITED RIGHT UPPER QUADRANT

[Series 1: ultrasound abdomen limited · 14 of 81 slices shown]
[im 1/81]
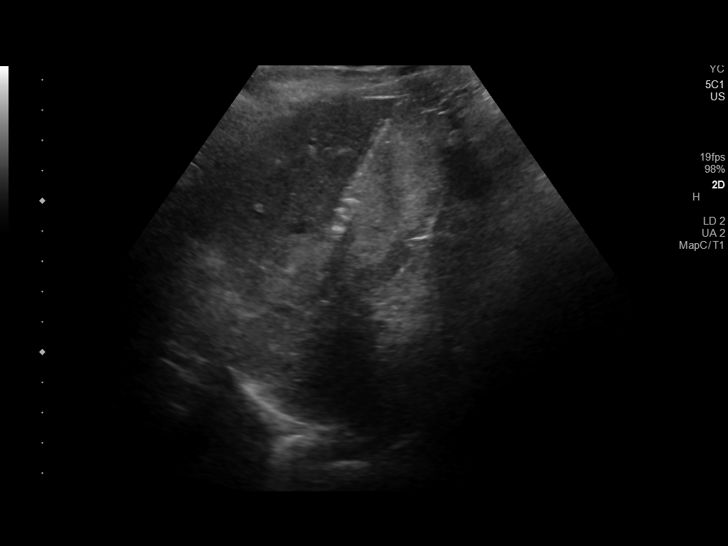
[im 7/81]
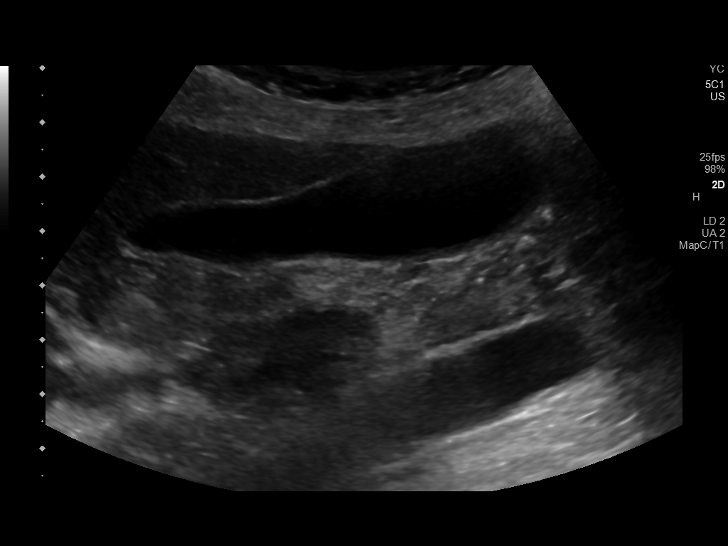
[im 14/81]
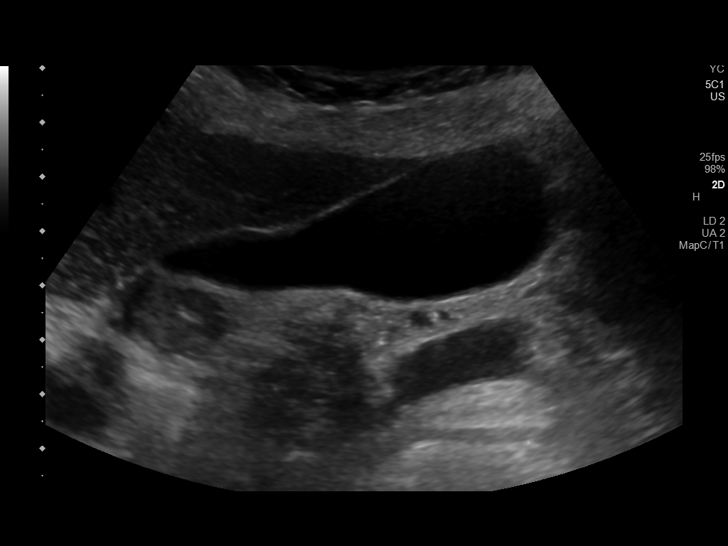
[im 21/81]
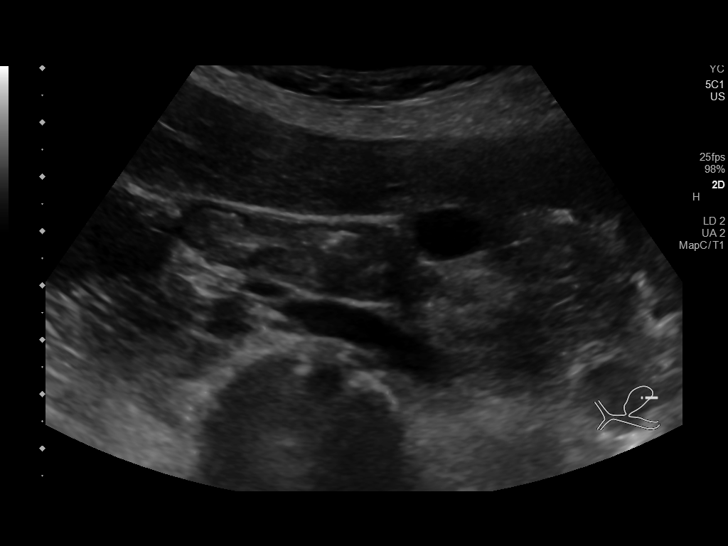
[im 27/81]
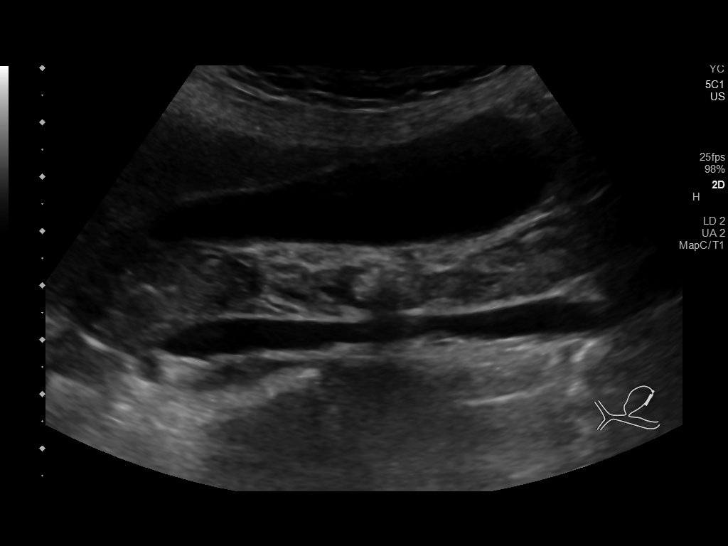
[im 31/81]
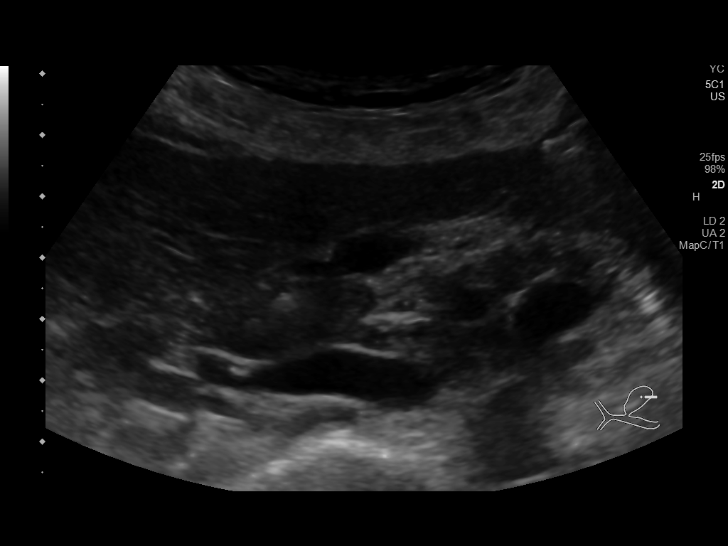
[im 37/81]
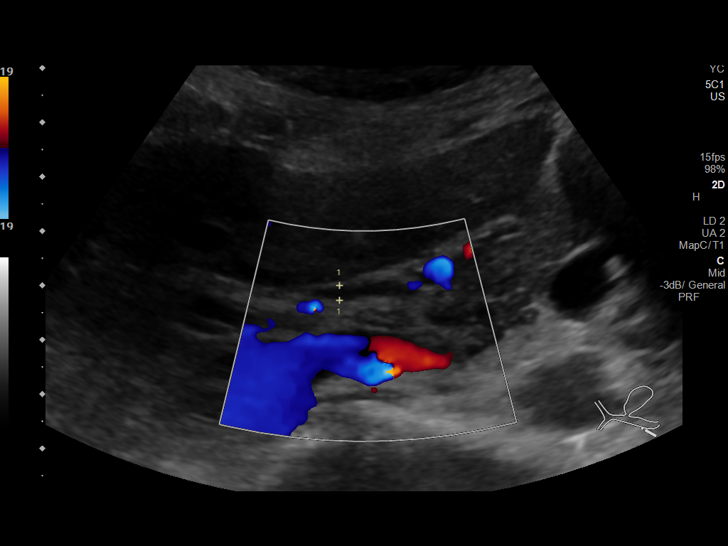
[im 44/81]
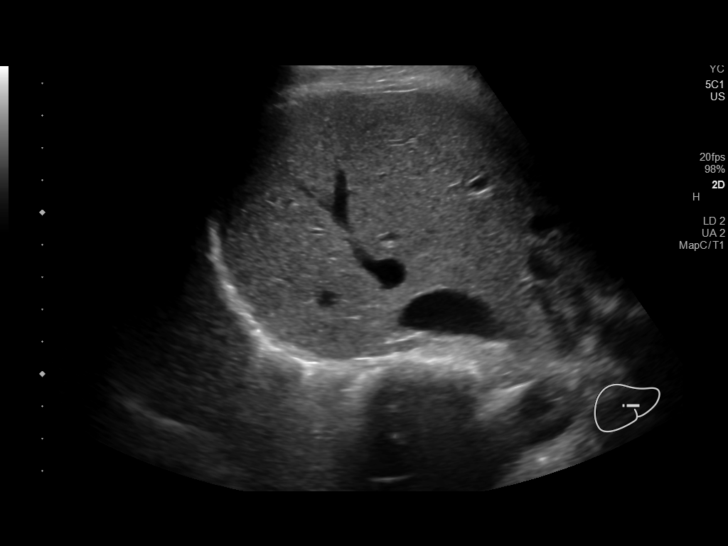
[im 51/81]
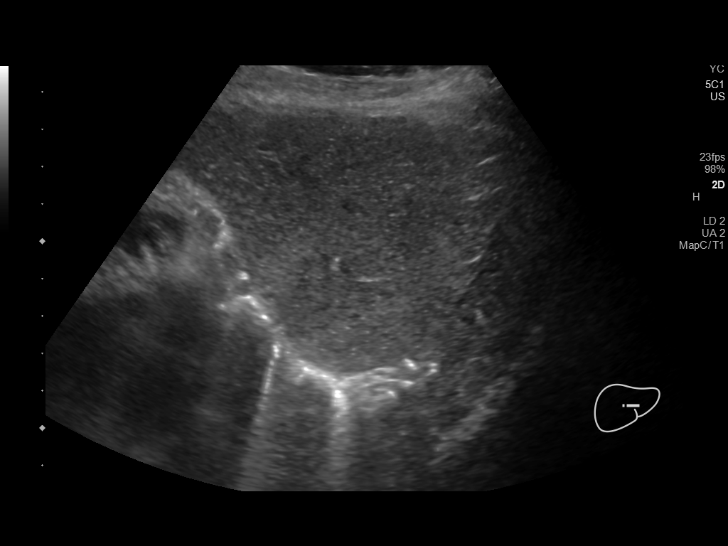
[im 54/81]
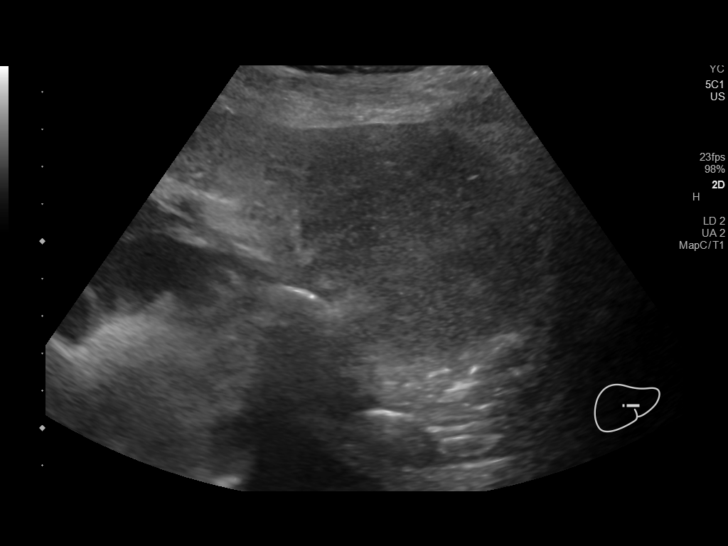
[im 61/81]
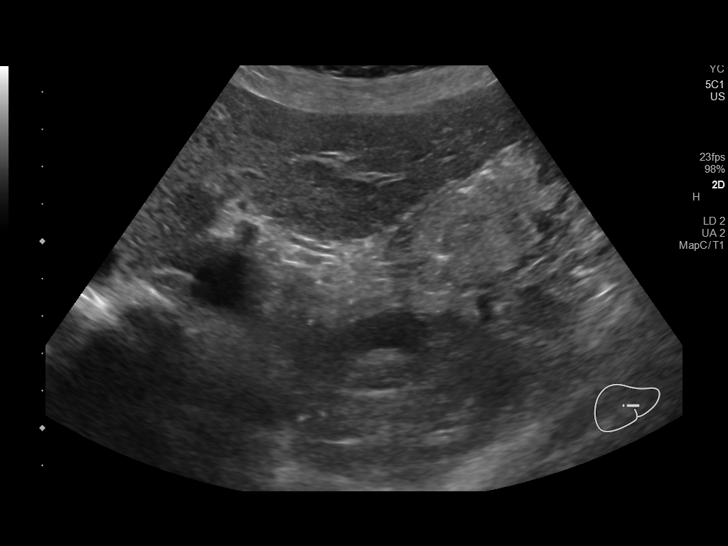
[im 67/81]
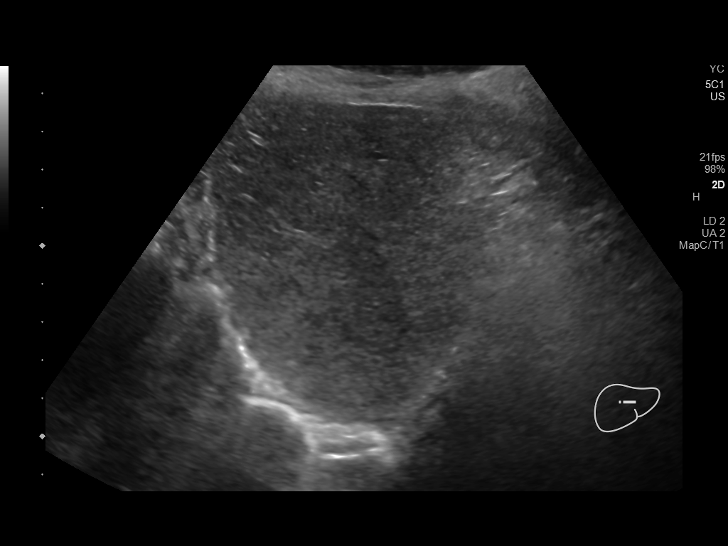
[im 74/81]
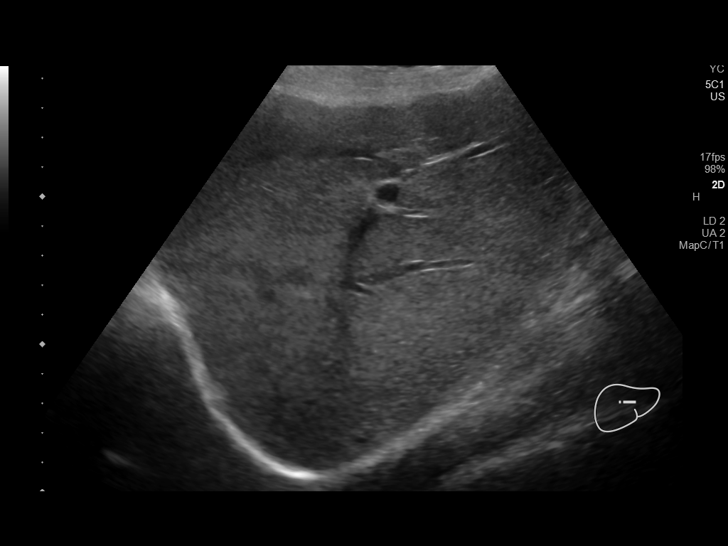
[im 81/81]
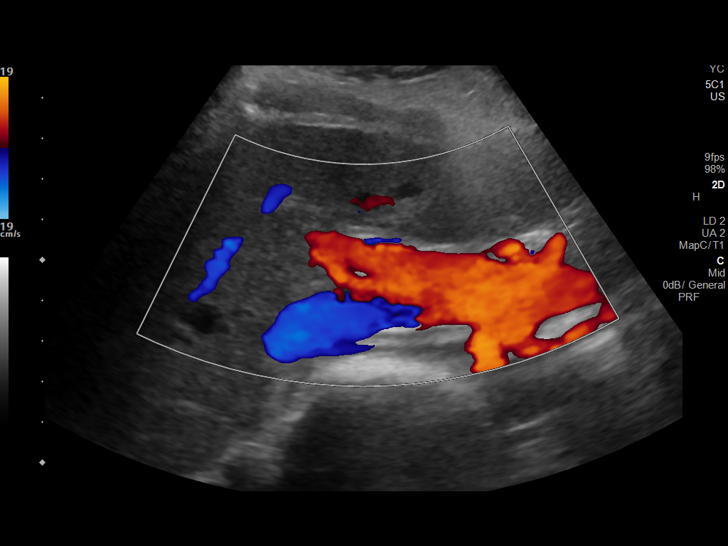

[14 of 25 positions shown; findings below may reference images not displayed]

FINDINGS: Gallbladder:

No gallstones or wall thickening visualized. No sonographic Murphy
sign noted by sonographer.

Common bile duct:

Diameter: 3 millimeters, normal.

Liver:

No focal lesion identified. Within normal limits in parenchymal
echogenicity. Portal vein is patent on color Doppler imaging with
normal direction of blood flow towards the liver.

Other findings: Negative visible right kidney.  No free fluid.
IMPRESSION: Negative right upper quadrant ultrasound.

## 2020-12-14 ENCOUNTER — Encounter (HOSPITAL_COMMUNITY): Payer: Self-pay | Admitting: Emergency Medicine

## 2020-12-14 ENCOUNTER — Ambulatory Visit (HOSPITAL_COMMUNITY)
Admission: EM | Admit: 2020-12-14 | Discharge: 2020-12-14 | Disposition: A | Discharge status: Home / Self Care | Payer: Medicaid Other | Attending: Emergency Medicine | Admitting: Emergency Medicine

## 2020-12-14 ENCOUNTER — Other Ambulatory Visit: Payer: Self-pay

## 2020-12-14 DIAGNOSIS — K029 Dental caries, unspecified: Secondary | ICD-10-CM | POA: Diagnosis not present

## 2020-12-14 DIAGNOSIS — K0889 Other specified disorders of teeth and supporting structures: Secondary | ICD-10-CM

## 2020-12-14 MED ORDER — TRAMADOL HCL 50 MG PO TABS: 50.0000 mg | ORAL_TABLET | Freq: Four times a day (QID) | ORAL | 0 refills | Status: DC | PRN

## 2020-12-14 MED ORDER — AMOXICILLIN 500 MG PO CAPS
500.0000 mg | ORAL_CAPSULE | Freq: Three times a day (TID) | ORAL | 0 refills | Status: DC
Start: 2020-12-14 — End: 2021-03-04

## 2020-12-14 NOTE — ED Triage Notes (Signed)
Pt is present today with right side dental pain.Pt states that two days ago she noticed the pain was worse. Pt states that she does noticed some swelling on the right side of her face. Pt states that she took four Aspirin around 5:30am for pain but has not helped.

## 2020-12-14 NOTE — ED Provider Notes (Signed)
[facility]    CSN: [id number] Arrival date & time: 12/14/20  0831      History   Chief Complaint Chief Complaint  Patient presents with  . Dental Pain    HPI Kristina Moyer is a 40 y.o. female.   Rt upper tooth pain for several days now. States that she does not have a dentist has several bad teeth. Took several dose of asa pta with no relief.      Past Medical History:  Diagnosis Date  . Alcoholism (HCC)   . Pancreatitis, chronic [facility])     Patient Active Problem List   Diagnosis Date Noted  . Pyrexia   . Alcohol-induced chronic pancreatitis (HCC)   . Tobacco abuse   . Pancreatitis 12/20/2014  . Abdominal pain 12/20/2014  . Tobacco use   . Acute pancreatitis 11/17/2012  . Iron deficiency anemia 11/17/2012  . Increased anion gap metabolic acidosis 11/17/2012  . Alcohol abuse 11/17/2012    Past Surgical History:  Procedure Laterality Date  . TUBAL LIGATION      OB History    Gravida  4   Para  3   Term  3   Preterm      AB  1   Living  3     SAB  1   IAB      Ectopic      Multiple      Live Births               Home Medications    Prior to Admission medications   Medication Sig Start Date End Date Taking? Authorizing Provider  acetaminophen (TYLENOL) 500 MG tablet Take 1,000 mg by mouth every 8 (eight) hours as needed for moderate pain.     [provider]  benzonatate (TESSALON) 100 MG capsule Take 1-2 capsules (100-200 mg total) by mouth 3 (three) times daily as needed. 03/08/20   Wallis Bamberg, PA-C  cetirizine (ZYRTEC ALLERGY) 10 MG tablet Take 1 tablet (10 mg total) by mouth daily. 03/08/20   Wallis Bamberg, PA-C  ibuprofen (ADVIL) 200 MG tablet Take 400 mg by mouth every 6 (six) hours as needed for mild pain or moderate pain.    [provider]  naproxen (NAPROSYN) 500 MG tablet Take 1 tablet (500 mg total) by mouth 2 (two) times daily with a meal. 03/08/20   Wallis Bamberg, PA-C   promethazine-dextromethorphan (PROMETHAZINE-DM) 6.25-15 MG/5ML syrup Take 5 mLs by mouth at bedtime as needed for cough. 03/08/20   Wallis Bamberg, PA-C  pseudoephedrine (SUDAFED) 60 MG tablet Take 1 tablet (60 mg total) by mouth every 8 (eight) hours as needed for congestion. 03/08/20   Wallis Bamberg, PA-C    Family History Family History  Family history unknown: Yes    Social History Social History   Tobacco Use  . Smoking status: Current Every Day Smoker    Packs/day: 1.00    Types: Cigarettes  . Smokeless tobacco: Never Used  Substance Use Topics  . Alcohol use: Yes    Comment: occasional  . Drug use: No     Allergies   Patient has no known allergies.   Review of Systems Review of Systems  Constitutional: Negative.   HENT: Positive for dental problem.   Respiratory: Negative.   Cardiovascular: Negative.   Skin: Negative.   Neurological: Negative.      Physical Exam Triage Vital Signs ED Triage Vitals  Enc Vitals Group     BP 12/14/20  5732 121/71     Pulse Rate 12/14/20 0842 93     Resp 12/14/20 0842 18     Temp 12/14/20 0842 98.7 F (37.1 C)     Temp Source 12/14/20 0842 Oral     SpO2 12/14/20 0842 96 %     Weight --      Height --      Head Circumference --      Peak Flow --      Pain Score 12/14/20 0840 10     Pain Loc --      Pain Edu? --      Excl. in GC? --    No data found.  Updated Vital Signs BP 121/71 (BP Location: Left Arm)   Pulse 93   Temp 98.7 F (37.1 C) (Oral)   Resp 18   SpO2 96%   Visual Acuity     Physical Exam Constitutional:      General: She is in acute distress.  HENT:     Right Ear: Tympanic membrane normal.     Left Ear: Tympanic membrane normal.     Nose: Nose normal.     Mouth/Throat:     Mouth: Mucous membranes are moist.     Comments: Multiple dental caries rt upper posterior has several teeth that are erythema, broken,  Eyes:     Pupils: Pupils are equal, round, and reactive to light.  Neurological:      Mental Status: She is alert.      UC Treatments / Results  Labs (all labs ordered are listed, but only abnormal results are displayed) Labs Reviewed - No data to display  EKG   Radiology No results found.  Procedures Procedures (including critical care time)  Medications Ordered in UC Medications - No data to display  Initial Impression / Assessment and Plan / UC Course  I have reviewed the triage vital signs and the nursing notes.  Pertinent labs & imaging results that were available during my care of the patient were reviewed by me and considered in my medical decision making (see chart for details).     Pt will need to have several teeth removed due to caries and broken teeth.  Educated pt that the pain will decrease but not completely go away until the tooth is treated or removed.  Can take nsaids or tylenol as needed for pain   Final Clinical Impressions(s) / UC Diagnoses   Final diagnoses:  Pain, dental  Pain due to dental caries   Discharge Instructions   None    ED Prescriptions    None     PDMP not reviewed this encounter.   Coralyn Mark, NP 12/14/20 [phone]

## 2021-03-04 ENCOUNTER — Other Ambulatory Visit: Payer: Self-pay

## 2021-03-04 ENCOUNTER — Encounter (HOSPITAL_COMMUNITY): Payer: Self-pay | Admitting: Emergency Medicine

## 2021-03-04 ENCOUNTER — Emergency Department (HOSPITAL_COMMUNITY)
Admission: EM | Admit: 2021-03-04 | Discharge: 2021-03-04 | Disposition: A | Discharge status: Home / Self Care | Payer: Medicaid Other | Attending: Emergency Medicine | Admitting: Emergency Medicine

## 2021-03-04 DIAGNOSIS — F17210 Nicotine dependence, cigarettes, uncomplicated: Secondary | ICD-10-CM | POA: Diagnosis not present

## 2021-03-04 DIAGNOSIS — F10129 Alcohol abuse with intoxication, unspecified: Secondary | ICD-10-CM | POA: Diagnosis not present

## 2021-03-04 DIAGNOSIS — Y905 Blood alcohol level of 100-119 mg/100 ml: Secondary | ICD-10-CM | POA: Diagnosis not present

## 2021-03-04 DIAGNOSIS — Z79899 Other long term (current) drug therapy: Secondary | ICD-10-CM | POA: Diagnosis not present

## 2021-03-04 DIAGNOSIS — F10920 Alcohol use, unspecified with intoxication, uncomplicated: Secondary | ICD-10-CM

## 2021-03-04 LAB — I-STAT BETA HCG BLOOD, ED (MC, WL, AP ONLY): I-stat hCG, quantitative: <5 m[IU]/mL (ref <5)

## 2021-03-04 LAB — COMPREHENSIVE METABOLIC PANEL
ALT: 13 U/L (ref 0–44)
AST: 17 U/L (ref 15–41)
Albumin: 3.7 g/dL (ref 3.5–5.0)
Alkaline Phosphatase: 62 U/L (ref 38–126)
Anion gap: 8 (ref 5–15)
BUN: 12 mg/dL (ref 6–20)
CO2: 21 mmol/L — ABNORMAL LOW (ref 22–32)
Calcium: 8.3 mg/dL — ABNORMAL LOW (ref 8.9–10.3)
Chloride: 105 mmol/L (ref 98–111)
Creatinine, Ser: 0.55 mg/dL (ref 0.44–1.00)
GFR, Estimated: >60 mL/min (ref >60)
Glucose, Bld: 152 mg/dL — ABNORMAL HIGH (ref 70–99)
Potassium: 3.2 mmol/L — ABNORMAL LOW (ref 3.5–5.1)
Sodium: 134 mmol/L — ABNORMAL LOW (ref 135–145)
Total Bilirubin: 0.5 mg/dL (ref 0.3–1.2)
Total Protein: 6.6 g/dL (ref 6.5–8.1)

## 2021-03-04 LAB — ETHANOL: Alcohol, Ethyl (B): 118 mg/dL — ABNORMAL HIGH (ref <10)

## 2021-03-04 LAB — CBC WITH DIFFERENTIAL/PLATELET
Abs Immature Granulocytes: 0.05 10*3/uL (ref 0.00–0.07)
Basophils Absolute: 0 10*3/uL (ref 0.0–0.1)
Basophils Relative: 0 %
Eosinophils Absolute: 0.1 10*3/uL (ref 0.0–0.5)
Eosinophils Relative: 1 %
HCT: 29.2 % — ABNORMAL LOW (ref 36.0–46.0)
Hemoglobin: 9.5 g/dL — ABNORMAL LOW (ref 12.0–15.0)
Immature Granulocytes: 1 %
Lymphocytes Relative: 29 %
Lymphs Abs: 1.8 10*3/uL (ref 0.7–4.0)
MCH: 27.9 pg (ref 26.0–34.0)
MCHC: 32.5 g/dL (ref 30.0–36.0)
MCV: 85.9 fL (ref 80.0–100.0)
Monocytes Absolute: 0.5 10*3/uL (ref 0.1–1.0)
Monocytes Relative: 9 %
Neutro Abs: 3.6 10*3/uL (ref 1.7–7.7)
Neutrophils Relative %: 60 %
Platelets: 252 10*3/uL (ref 150–400)
RBC: 3.4 MIL/uL — ABNORMAL LOW (ref 3.87–5.11)
RDW: 17.5 % — ABNORMAL HIGH (ref 11.5–15.5)
WBC: 6.1 10*3/uL (ref 4.0–10.5)
nRBC: 0 % (ref 0.0–0.2)

## 2021-03-04 NOTE — ED Notes (Signed)
Pt does not feel able to ambulate at this time.

## 2021-03-04 NOTE — Discharge Instructions (Addendum)
Substance Abuse Treatment Programs ° °Intensive Outpatient Programs °[facility]     °[address]      °[place], [place]                   °[phone]      ° °[facility] °[address] °[place], [place] °[phone] ° °[facility]     °(Inpatient and outpatient)     °[address]           °[phone]   ° °[facility] °[phone] (Suboxone and Methadone) ° °[address]      °[place], [place] [address]      °[phone]      ° °[address] °[place], [place] °[phone] ° °[facility] (Outpatient/Inpatient, [facility])    °(insurance only) [phone]      °       °[facility]Groups & Residential) °[place], [place] °[phone] ° °   °[facility]     °[address]     °[place], [place]      °[phone]      ° °[facility] (for caregivers and family) °[address] °[place], [place] °336-299-4655 ° ° ° ° ° °Residential Treatment Programs °[facility]      °[address], [place], [place] [address]  °[phone]      ° °[facility] °[address] [place], [place] [address] °[phone] ° °[facility]        °[phone]      ° °[facility] °[phone] ° °[facility]             °[address]                                         °[place], [place]                                                °877-615-2722 or [phone]                              ° °[facility] [place] °[address] °[place], [address] °[phone] ° °[facility]    °[address]      °[place], [place]     °[phone]      ° °[facility] °[address] °, [place] °[phone] ° °[facility]   °[address]     °[place], [place] [address]     °[phone]      °Admissions: 8am-3pm M-F ° °[facility] °[address] °[place],  [place] °[phone] ° °[facility]: Residential Program (90 Days)   °[place], [place]      °[phone] or [phone]    ° °[facility] °[place], [place] °(Walk in Hours over the weekend or by referral) ° °[facility] °[address], [place], [place] [address] °([phone] ° °Crisis Mobile: [facility]:  1-877-626-1772 (for crisis response 24 hours a day) °[facility] Hotline:      1-800-256-2452 °Outpatient Psychiatry and Counseling ° °[facility]: Mobile Crisis   Management 24 hours:  1-877-626-1772 ° °[facility] sliding scale fee and walk in schedule: M-F 8am-12pm/1pm-3pm °[address]  °[place], [place] [address] °[phone] ° °[facility] °[address] °[place], [place] [address] °[phone] ° °[facility] ([facility] known [facility])- new patient walk-in appointments available Monday - Friday 8am -3pm.          °[address] °[place], [place] [address] °336-676-6840 or crisis line- 336-676-6905 ° °[facility] Intensive Outpatient Therapy Program °[address] °[place], [place] [address] °[phone] ° °[facility]                  °Crisis Services      °[phone]      °[address]     °[place], [place] [address]                ° °[facility]   °[facility] °[phone] °[address] °[place], [place] [address] ° ° °[facility]          °[address]  °[place], [place] [address]       °[phone] ° °[facility] °[address] °[place], [place] [address] °336-292-1510 ° °[facility]    °[address]    °[place], [place] [address]     °[phone]      ° °Parish McKinney, [place]     °[address]     °[place] [address]     °[phone]     °  °[facility] °[address] °[place] [address] ° °[facility]     °[address]     °[place], [place]      °336-542-2076      ° °[facility] °Shamsher Ahluwalia, MD °[address] °[place], [place] [address] °[phone] ° °[facility]     °[address]     °[place], [place] [address]     °336-274-1237      ° °[facility] °[address] °[place], [place] [address] °336-854-4450 °Resources for Temporary Residential Assistance/Crisis Centers ° °DAY CENTERS °[facility] °M-F 8am-3pm   °[address] [place], [place] [address]   336-332-0824 °Services include: laundry, barbering, support groups, case management, phone  & computer access, showers, AA/NA mtgs, mental health/substance abuse nurse, job skills class, disability information, VA assistance, spiritual classes, etc.  ° °HOMELESS SHELTERS ° °[facility]     °[facility]   °[address], [place]     °[phone]       °       °[facility] (women and children)       °[address] °[place], [place] [address] °336-275-0820 °[email or web address] for application and process °Application Required ° °[facility]   °[address]    °[place] [address]     °[phone]       °             °[facility] °[address] °[place], [place] [address] °[phone] °336-235-0363(schedule application appt.) °Application Required ° °[facility] (women only)    °[address]     °[place], [place] [address]     °336-884-1039      °  Intake starts 6pm daily °Need valid ID, SSC, & Police report °[facility] [place] °[address] °[place], [place] °336-881-5420 °Application Required ° °[facility] (men only)     °[address]      °[place], [place]     °[phone]      ° °[facility] °(Pregnant women only) °[address] °[place], [place] °336-275-0206 ° °[facility]      °[address]      °[place], [place] [address]     °[phone]      °       °[facility] °[address] °[place], [place] °336-723-1848 °90 day commitment/SA/Application process ° °[facility](men only)     °[address]     °[place], [place]     °[phone]       °Check-in at 7pm     °       °[facility] °[address] °[place], [place] [address] °336-248-6684 °Men/Women/Women and Children must be there by 7 pm ° °[facility] °[place], [place] °336-722-8721                ° °

## 2021-03-04 NOTE — ED Provider Notes (Signed)
[facility]EMER[facility]PT Provider Note   CSN: [id number] Arrival date & time: 03/04/21  0018     History Chief Complaint  Patient presents with   Alcohol Intoxication   Level 5 caveat due to altered mental status Kristina Moyer is a 40 y.o. female.  The history is provided by the patient. The history is limited by the condition of the patient.  Alcohol Intoxication This is a new problem. The problem occurs constantly. Nothing aggravates the symptoms. Nothing relieves the symptoms.  Patient with history of alcohol use disorder, chronic pancreatitis presents with altered mental status.  Reported patient drank large amounts of alcohol and took THC.  Patient was given Zofran and IV fluids prior to arrival. Patient is unable to provide any further history    Past Medical History:  Diagnosis Date   Alcoholism (HCC)    Pancreatitis, chronic (HCC)     Patient Active Problem List   Diagnosis Date Noted   Pyrexia    Alcohol-induced chronic pancreatitis (HCC)    Tobacco abuse    Pancreatitis 12/20/2014   Abdominal pain 12/20/2014   Tobacco use    Acute pancreatitis 11/17/2012   Iron deficiency anemia 11/17/2012   Increased anion gap metabolic acidosis 11/17/2012   Alcohol abuse 11/17/2012    Past Surgical History:  Procedure Laterality Date   TUBAL LIGATION       OB History     Gravida  4   Para  3   Term  3   Preterm      AB  1   Living  3      SAB  1   IAB      Ectopic      Multiple      Live Births              Family History  Family history unknown: Yes    Social History   Tobacco Use   Smoking status: Every Day    Packs/day: 1.00    Types: Cigarettes   Smokeless tobacco: Never  Substance Use Topics   Alcohol use: Yes    Comment: occasional   Drug use: No    Home Medications Prior to Admission medications   Medication Sig Start Date End Date Taking? Authorizing Provider  acetaminophen (TYLENOL) 500 MG  tablet Take 1,000 mg by mouth every 8 (eight) hours as needed for moderate pain.     [provider]  cetirizine (ZYRTEC ALLERGY) 10 MG tablet Take 1 tablet (10 mg total) by mouth daily. 03/08/20   Wallis Bamberg, PA-C  ibuprofen (ADVIL) 200 MG tablet Take 400 mg by mouth every 6 (six) hours as needed for mild pain or moderate pain.    [provider]    Allergies    Patient has no known allergies.  Review of Systems   Review of Systems  Unable to perform ROS: Mental status change   Physical Exam Updated Vital Signs BP 107/64   Pulse 67   Temp 97.9 F (36.6 C) (Axillary)   Resp 20   Ht 1.651 m (5\' 5" )   Wt 56.7 kg   SpO2 99%   BMI 20.80 kg/m   Physical Exam CONSTITUTIONAL: Disheveled, sleeping HEAD: Normocephalic/atraumatic, no visible trauma EYES: Pupils dilated bilaterally, but equal and reactive ENMT: Mucous membranes moist NECK: supple no meningeal signs CV: S1/S2 noted, no murmurs/rubs/gallops noted LUNGS: Lungs are clear to auscultation bilaterally, no apparent distress ABDOMEN: soft, nontender NEURO: Pt is sleeping  but arousable to voice and pain EXTREMITIES: pulses normal/equal, no deformities SKIN: warm, color normal PSYCH: Unable to assess  ED Results / Procedures / Treatments   Labs (all labs ordered are listed, but only abnormal results are displayed) Labs Reviewed  CBC WITH DIFFERENTIAL/PLATELET - Abnormal; Notable for the following components:      Result Value   RBC 3.40 (*)    Hemoglobin 9.5 (*)    HCT 29.2 (*)    RDW 17.5 (*)    All other components within normal limits  COMPREHENSIVE METABOLIC PANEL - Abnormal; Notable for the following components:   Sodium 134 (*)    Potassium 3.2 (*)    CO2 21 (*)    Glucose, Bld 152 (*)    Calcium 8.3 (*)    All other components within normal limits  ETHANOL - Abnormal; Notable for the following components:   Alcohol, Ethyl (B) 118 (*)    All other components within normal limits  RAPID  URINE DRUG SCREEN, HOSP PERFORMED  I-STAT BETA HCG BLOOD, ED ([facility], [facility], [facility] ONLY)    EKG None  Radiology No results found.  Procedures Procedures   Medications Ordered in ED Medications - No data to display  ED Course  I have reviewed the triage vital signs and the nursing notes.  Pertinent labs results that were available during my care of the patient were reviewed by me and considered in my medical decision making (see chart for details).    MDM Rules/Calculators/A&P                          4:36 AM  Patient presented with presumed alcohol intoxication.  Patient was very somnolent on initial evaluation.  Alcohol level was elevated. Patient is now improved.  She can now ambulate.  She cannot recall the details, but does appear to be improving.  She reports she can get a ride home and I feel she is safe for discharge home  No signs of trauma, or any other complaints at this time Final Clinical Impression(s) / ED Diagnoses Final diagnoses:  Alcoholic intoxication without complication [facility])    Rx / DC Orders ED Discharge Orders     None        Zadie Rhine, MD 03/04/21 [phone]

## 2021-03-04 NOTE — ED Notes (Signed)
Patient ambulated without any difficulty

## 2021-03-04 NOTE — ED Triage Notes (Signed)
Pt arrived via EMS. EMS reports that pt drank "large amounts" of ETOH and took THC gummies. Pt has not been responds to voice. Pt had 4mg  zofran and 1000mg  NaCl with EMS.

## 2021-09-17 ENCOUNTER — Encounter (HOSPITAL_COMMUNITY): Payer: Self-pay | Admitting: *Deleted

## 2021-09-17 ENCOUNTER — Ambulatory Visit (HOSPITAL_COMMUNITY)
Admission: EM | Admit: 2021-09-17 | Discharge: 2021-09-17 | Disposition: A | Discharge status: Home / Self Care | Payer: Medicaid Other | Attending: Family Medicine | Admitting: Family Medicine

## 2021-09-17 ENCOUNTER — Other Ambulatory Visit: Payer: Self-pay

## 2021-09-17 DIAGNOSIS — M546 Pain in thoracic spine: Secondary | ICD-10-CM

## 2021-09-17 DIAGNOSIS — R8290 Unspecified abnormal findings in urine: Secondary | ICD-10-CM | POA: Diagnosis present

## 2021-09-17 HISTORY — DX: Anxiety disorder, unspecified: F41.9

## 2021-09-17 LAB — POCT URINALYSIS DIPSTICK, ED / UC
Bilirubin Urine: NEGATIVE
Glucose, UA: NEGATIVE mg/dL
Ketones, ur: NEGATIVE mg/dL
Nitrite: NEGATIVE
Protein, ur: NEGATIVE mg/dL
Specific Gravity, Urine: 1.025 (ref 1.005–1.030)
Urobilinogen, UA: 0.2 mg/dL (ref 0.0–1.0)
pH: 6 (ref 5.0–8.0)

## 2021-09-17 LAB — POC URINE PREG, ED: Preg Test, Ur: NEGATIVE

## 2021-09-17 MED ORDER — TIZANIDINE HCL 4 MG PO TABS: 4.0000 mg | ORAL_TABLET | Freq: Three times a day (TID) | ORAL | 0 refills | Status: DC | PRN

## 2021-09-17 MED ORDER — DICLOFENAC SODIUM 75 MG PO TBEC: 75.0000 mg | DELAYED_RELEASE_TABLET | Freq: Two times a day (BID) | ORAL | 0 refills | Status: DC

## 2021-09-17 NOTE — ED Triage Notes (Signed)
C/O left flank area pain onset approx 2 wks ago; pain occurs with movement and any activity.  Area tender to palpation.

## 2021-09-17 NOTE — Discharge Instructions (Signed)
I believe that your symptoms are related to a muscle strain given your physical exam findings.  Start diclofenac twice daily.  Do not take NSAIDs with this medication due to risk of GI bleeding including aspirin, ibuprofen/Advil, naproxen/Aleve.  Take Zanaflex 3 times a day as needed.  This will make you sleepy so do not drive or drink alcohol with taking it.  Use heat, rest, stretch for additional symptom relief.  If your pain is migrating or changing in someway I do recommend that you follow-up with your PCP or severe go to the emergency room to consider a CT scan to rule out a kidney stone.  Your urine had some abnormalities we will contact you when you start antibiotic.  Make sure you rest and drink plenty fluid.  If you have any worsening symptoms including severe pain, nausea/vomiting, fever, blood in your urine you should go to the emergency room.

## 2021-09-17 NOTE — ED Provider Notes (Signed)
[place]    CSN: [id number] Arrival date & time: 09/17/21  1710      History   Chief Complaint Chief Complaint  Patient presents with   Flank Pain    HPI Kristina Moyer is a 41 y.o. female.   Patient presents today with a 2-week history of left flank/side pain that has worsened in the past several days.  She reports pain is rated 10 on a 0-10 pain scale, worse with certain activities, no alleviating factors identified, described as aching with periodic sharp pains, no alleviating factors identified.  She has tried [employer] and Tylenol without improvement of symptoms.  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  She denies previous back injury or spinal surgery.  She denies any urinary symptoms including frequency, urgency, hematuria, flank pain, nausea, vomiting.  She denies history of nephrolithiasis.  She is having difficulty with daily tibias as result of symptoms.   Past Medical History:  Diagnosis Date   Alcoholism ([place])    Anxiety    Pancreatitis, chronic ([place])     Patient Active Problem List   Diagnosis Date Noted   Pyrexia    Alcohol-induced chronic pancreatitis ([place])    Tobacco abuse    Pancreatitis 12/20/2014   Abdominal pain 12/20/2014   Tobacco use    Acute pancreatitis 11/17/2012   Iron deficiency anemia 11/17/2012   Increased anion gap metabolic acidosis [id number]   Alcohol abuse 11/17/2012    Past Surgical History:  Procedure Laterality Date   TUBAL LIGATION      OB History     Gravida  4   Para  3   Term  3   Preterm      AB  1   Living  3      SAB  1   IAB      Ectopic      Multiple      Live Births               Home Medications    Prior to Admission medications   Medication Sig Start Date End Date Taking? Authorizing Provider  diclofenac (VOLTAREN) 75 MG EC tablet Take 1 tablet (75 mg total) by mouth 2 (two)  times daily. 09/17/21  Yes Ian Castagna K, PA-C  DOXEPIN HCL PO Take by mouth.   Yes [provider]  tiZANidine (ZANAFLEX) 4 MG tablet Take 1 tablet (4 mg total) by mouth every 8 (eight) hours as needed for muscle spasms. 09/17/21  Yes Arena Lindahl, Derry Skill, PA-C    Family History Family History  Problem Relation Age of Onset   Healthy Mother     Social History Social History   Tobacco Use   Smoking status: Former   Smokeless tobacco: Never  Scientific laboratory technician Use: Never used  Substance Use Topics   Alcohol use: Yes    Comment: occasional   Drug use: No     Allergies   Patient has no known allergies.   Review of Systems Review of Systems  Constitutional:  Positive for activity change. Negative for appetite change, fatigue and fever.  Respiratory:  Negative for cough and shortness of breath.   Cardiovascular:  Negative for chest pain.  Gastrointestinal:  Negative for abdominal pain, diarrhea, nausea and vomiting.  Musculoskeletal:  Positive for back pain. Negative for arthralgias and myalgias.  Neurological:  Negative for  dizziness, light-headedness and headaches.    Physical Exam Triage Vital Signs ED Triage Vitals  Enc Vitals Group     BP 09/17/21 1820 113/72     Pulse Rate 09/17/21 1820 84     Resp 09/17/21 1820 16     Temp 09/17/21 1820 98.3 F (36.8 C)     Temp Source 09/17/21 1820 Oral     SpO2 09/17/21 1820 99 %     Weight --      Height --      Head Circumference --      Peak Flow --      Pain Score 09/17/21 1822 10     Pain Loc --      Pain Edu? --      Excl. in [place]? --    No data found.  Updated Vital Signs BP 113/72    Pulse 84    Temp 98.3 F (36.8 C) (Oral)    Resp 16    LMP 09/01/2021 (Approximate)    SpO2 99%   Visual Acuity Right Eye Distance:   Left Eye Distance:   Bilateral Distance:    Right Eye Near:   Left Eye Near:    Bilateral Near:     Physical Exam Vitals reviewed.  Constitutional:      General: She is awake. She  is not in acute distress.    Appearance: Normal appearance. She is well-developed. She is not ill-appearing.     Comments: Very pleasant female appears stated age in no acute distress sitting comfortably in exam room  HENT:     Head: Normocephalic and atraumatic.  Cardiovascular:     Rate and Rhythm: Normal rate and regular rhythm.     Heart sounds: Normal heart sounds, S1 normal and S2 normal. No murmur heard. Pulmonary:     Effort: Pulmonary effort is normal.     Breath sounds: Normal breath sounds. No wheezing, rhonchi or rales.     Comments: Clear to auscultation bilaterally Abdominal:     General: Bowel sounds are normal.     Palpations: Abdomen is soft.     Tenderness: There is no abdominal tenderness. There is no right CVA tenderness, left CVA tenderness, guarding or rebound.     Comments: Benign abdominal exam  Musculoskeletal:     Cervical back: No tenderness or bony tenderness.     Thoracic back: Spasms and tenderness present. No bony tenderness.     Lumbar back: No tenderness or bony tenderness.       Back:     Comments: Tenderness over thoracic/lumbar left paraspinal muscles with noted spasm.  No deformity or step-off noted.  No pain with correction of vertebrae.  Normal active range of motion.  Psychiatric:        Behavior: Behavior is cooperative.     UC Treatments / Results  Labs (all labs ordered are listed, but only abnormal results are displayed) Labs Reviewed  POCT URINALYSIS DIPSTICK, ED / UC - Abnormal; Notable for the following components:      Result Value   Hgb urine dipstick TRACE (*)    Leukocytes,Ua TRACE (*)    All other components within normal limits  URINE CULTURE  POC URINE PREG, ED    EKG   Radiology No results found.  Procedures Procedures (including critical care time)  Medications Ordered in UC Medications - No data to display  Initial Impression / Assessment and Plan / UC Course  I have reviewed the triage vital  signs and the  nursing notes.  Pertinent labs & imaging results that were available during my care of the patient were reviewed by me and considered in my medical decision making (see chart for details).     Given spasm on exam will treat as musculoskeletal etiology.  Patient was started on tizanidine to be used up to 3 times a day as needed with instruction to drive drink alcohol taking this medication as drowsiness is a common side effect.  We will also start diclofenac twice daily with instruction to take additional NSAIDs.  She can use heat, rest, stretch for additional symptom relief.  UA was obtained which showed trace leukocyte esterase as well as hemoglobin.  Patient reports that pain has been in the same position since onset so unlikely to be nephrolithiasis but discussed that if symptoms or not improving she should follow-up with PCP to consider CT to rule out kidney stone.  Urine culture was obtained but will defer antibiotic treatment until results are available.  She was encouraged to rest and drink plenty of fluid.  Discussed alarm symptoms that warrant emergent evaluation including fever, worsening pain, nausea/vomiting interfering with oral intake, blood in her urine, severe abdominal pain.  Strict return precautions given to which she expressed understanding.  Work excuse note provided.  Final Clinical Impressions(s) / UC Diagnoses   Final diagnoses:  Acute left-sided thoracic back pain  Abnormal urinalysis     Discharge Instructions      I believe that your symptoms are related to a muscle strain given your physical exam findings.  Start diclofenac twice daily.  Do not take NSAIDs with this medication due to risk of GI bleeding including aspirin, ibuprofen/Advil, naproxen/Aleve.  Take Zanaflex 3 times a day as needed.  This will make you sleepy so do not drive or drink alcohol with taking it.  Use heat, rest, stretch for additional symptom relief.  If your pain is migrating or changing in  someway I do recommend that you follow-up with your PCP or severe go to the emergency room to consider a CT scan to rule out a kidney stone.  Your urine had some abnormalities we will contact you when you start antibiotic.  Make sure you rest and drink plenty fluid.  If you have any worsening symptoms including severe pain, nausea/vomiting, fever, blood in your urine you should go to the emergency room.     ED Prescriptions     Medication Sig Dispense Auth. Provider   tiZANidine (ZANAFLEX) 4 MG tablet Take 1 tablet (4 mg total) by mouth every 8 (eight) hours as needed for muscle spasms. 30 tablet Keirstyn Aydt K, PA-C   diclofenac (VOLTAREN) 75 MG EC tablet Take 1 tablet (75 mg total) by mouth 2 (two) times daily. 20 tablet Campbell Agramonte, Derry Skill, PA-C      PDMP not reviewed this encounter.   Terrilee Croak, PA-C 09/17/21 1848

## 2021-09-18 LAB — URINE CULTURE

## 2022-03-02 ENCOUNTER — Encounter (HOSPITAL_COMMUNITY): Payer: Self-pay | Admitting: *Deleted

## 2022-03-02 ENCOUNTER — Ambulatory Visit (HOSPITAL_COMMUNITY)
Admission: EM | Admit: 2022-03-02 | Discharge: 2022-03-02 | Disposition: A | Discharge status: Home / Self Care | Payer: Medicaid Other | Attending: Urgent Care | Admitting: Urgent Care

## 2022-03-02 DIAGNOSIS — K029 Dental caries, unspecified: Secondary | ICD-10-CM | POA: Diagnosis not present

## 2022-03-02 DIAGNOSIS — K0889 Other specified disorders of teeth and supporting structures: Secondary | ICD-10-CM

## 2022-03-02 DIAGNOSIS — M26609 Unspecified temporomandibular joint disorder, unspecified side: Secondary | ICD-10-CM | POA: Diagnosis not present

## 2022-03-02 MED ORDER — AMOXICILLIN-POT CLAVULANATE 875-125 MG PO TABS: 1.0000 | ORAL_TABLET | Freq: Two times a day (BID) | ORAL | 0 refills | Status: AC

## 2022-03-02 MED ORDER — NAPROXEN SODIUM 550 MG PO TABS: 550.0000 mg | ORAL_TABLET | Freq: Two times a day (BID) | ORAL | 0 refills | Status: DC

## 2022-03-02 MED ORDER — BACLOFEN 10 MG PO TABS: 10.0000 mg | ORAL_TABLET | Freq: Three times a day (TID) | ORAL | 0 refills | Status: DC

## 2022-03-02 NOTE — ED Provider Notes (Signed)
[facility]    CSN: [id number] Arrival date & time: 03/02/22  1055      History   Chief Complaint Chief Complaint  Patient presents with   Dental Pain    HPI Kristina Moyer is a 41 y.o. female.    Dental Pain Pleasant 41 year old female presents today due to an on and off history of dental pain for the past 3 weeks.  Has a known understanding of poor dentition, admits to brushing teeth and flossing daily.  Try to get into dentist over the past couple weeks, but no appointments are available.  She states the pain seems to go from side-to-side, but the pain woke her up this morning on the left, near the TMJ joint primarily.  There is no swelling of her face.  She denies any drainage from her tooth.  No fever or systemic symptoms.  Has tried 800 mg ibuprofen twice and [place].  No additional treatments tried. Pt does smoke.  Past Medical History:  Diagnosis Date   Alcoholism ([facility])    Anxiety    Pancreatitis, chronic ([facility])     Patient Active Problem List   Diagnosis Date Noted   Pyrexia    Alcohol-induced chronic pancreatitis ([facility])    Tobacco abuse    Pancreatitis 12/20/2014   Abdominal pain 12/20/2014   Tobacco use    Acute pancreatitis 11/17/2012   Iron deficiency anemia 11/17/2012   Increased anion gap metabolic acidosis 11/17/2012   Alcohol abuse 11/17/2012    Past Surgical History:  Procedure Laterality Date   TUBAL LIGATION      OB History     Gravida  4   Para  3   Term  3   Preterm      AB  1   Living  3      SAB  1   IAB      Ectopic      Multiple      Live Births               Home Medications    Prior to Admission medications   Medication Sig Start Date End Date Taking? Authorizing Provider  amoxicillin-clavulanate (AUGMENTIN) 875-125 MG tablet Take 1 tablet by mouth every 12 (twelve) hours for 10 days. 03/02/22 03/12/22 Yes Mirl Hillery L, PA  baclofen (LIORESAL) 10 MG tablet Take 1 tablet (10 mg total) by mouth 3  (three) times daily. As needed. Sedation precaution 03/02/22  Yes Ken Bonn L, PA  naproxen sodium (ANAPROX DS) 550 MG tablet Take 1 tablet (550 mg total) by mouth 2 (two) times daily with a meal. 03/02/22  Yes Raley Novicki L, PA    Family History Family History  Problem Relation Age of Onset   Healthy Mother     Social History Social History   Tobacco Use   Smoking status: Former   Smokeless tobacco: Never  Building services engineer Use: Never used  Substance Use Topics   Alcohol use: Yes    Comment: occasional   Drug use: No     Allergies   Patient has no known allergies.   Review of Systems Review of Systems  HENT:  Positive for dental problem.   All other systems reviewed and are negative.    Physical Exam Triage Vital Signs ED Triage Vitals  Enc Vitals Group     BP 03/02/22 1118 110/78     Pulse Rate 03/02/22 1118 94     Resp 03/02/22  1118 18     Temp 03/02/22 1118 98.3 F (36.8 C)     Temp Source 03/02/22 1118 Oral     SpO2 03/02/22 1118 97 %     Weight --      Height --      Head Circumference --      Peak Flow --      Pain Score 03/02/22 1116 10     Pain Loc --      Pain Edu? --      Excl. in [place]? --    No data found.  Updated Vital Signs BP 110/78 (BP Location: Right Arm)   Pulse 94   Temp 98.3 F (36.8 C) (Oral)   Resp 18   LMP 02/12/2022 (Approximate)   SpO2 97%   Visual Acuity Right Eye Distance:   Left Eye Distance:   Bilateral Distance:    Right Eye Near:   Left Eye Near:    Bilateral Near:     Physical Exam Vitals and nursing note reviewed.  Constitutional:      General: She is not in acute distress.    Appearance: Normal appearance. She is well-developed. She is not ill-appearing, toxic-appearing or diaphoretic.  HENT:     Head: Normocephalic and atraumatic.     Jaw: Tenderness (to L TM joint) and pain on movement (subluxation on L) present. No trismus.     Salivary Glands: Right salivary gland is not diffusely enlarged  or tender. Left salivary gland is not diffusely enlarged or tender.     Right Ear: External ear normal.     Left Ear: External ear normal.     Nose: Nose normal. No congestion or rhinorrhea.     Mouth/Throat:     Lips: Pink. No lesions.     Mouth: Mucous membranes are moist. No injury, lacerations or oral lesions.     Dentition: Abnormal dentition. Dental tenderness and dental caries present. No gingival swelling or dental abscesses.     Tongue: No lesions.     Pharynx: Oropharynx is clear. Uvula midline. No pharyngeal swelling, oropharyngeal exudate, posterior oropharyngeal erythema or uvula swelling.     Tonsils: No tonsillar exudate or tonsillar abscesses.     Comments: Missing numerous molars to both top and bottom bilaterally. Several chipped teeth Eyes:     Conjunctiva/sclera: Conjunctivae normal.  Cardiovascular:     Rate and Rhythm: Normal rate.  Pulmonary:     Effort: Pulmonary effort is normal. No respiratory distress.  Musculoskeletal:        General: No swelling.     Cervical back: Normal range of motion and neck supple. No rigidity or tenderness.  Lymphadenopathy:     Cervical: No cervical adenopathy.  Skin:    General: Skin is warm and dry.     Capillary Refill: Capillary refill takes less than 2 seconds.  Neurological:     Mental Status: She is alert.  Psychiatric:        Mood and Affect: Mood normal.      UC Treatments / Results  Labs (all labs ordered are listed, but only abnormal results are displayed) Labs Reviewed - No data to display  EKG   Radiology No results found.  Procedures Procedures (including critical care time)  Medications Ordered in UC Medications - No data to display  Initial Impression / Assessment and Plan / UC Course  I have reviewed the triage vital signs and the nursing notes.  Pertinent labs & imaging results that  were available during my care of the patient were reviewed by me and considered in my medical decision making  (see chart for details).     Dental caries -no appreciable abscess noted.  No facial swelling.  Vital signs stable.  Will start patient on Augmentin to cover for bacterial infection, recommended she follow-up with dentist ASAP.  Dental resource list provided. TMJ -location of patient's pain today is in the left TMJ.  Suspect this to be the cause of the recurrent intermittent pain.  Subluxation noted on exam.  Will do naproxen and baclofen, TMJ handout provided, follow-up with dentist to see if bite guard or injection may be necessary.   Final Clinical Impressions(s) / UC Diagnoses   Final diagnoses:  Dental caries  TMJ (temporomandibular joint disorder)  Pain, dental     Discharge Instructions      I have prescribed you an antibiotic.  Please start taking this twice daily with food until gone.  Take with yogurt to prevent vaginal yeast infection. Please call the providers listed on the dental resources list to get a follow-up. Take the naproxen twice daily with food to help with pain and inflammation. Use the baclofen up to 3 times daily as needed as I suspect you have TMJ.  Use with caution as this can cause you to feel tired. Chew soft foods.  Avoid gum. Please read the attached handout for further information and instructions.     ED Prescriptions     Medication Sig Dispense Auth. Provider   naproxen sodium (ANAPROX DS) 550 MG tablet Take 1 tablet (550 mg total) by mouth 2 (two) times daily with a meal. 14 tablet Atticus Lemberger L, PA   amoxicillin-clavulanate (AUGMENTIN) 875-125 MG tablet Take 1 tablet by mouth every 12 (twelve) hours for 10 days. 20 tablet Britian Jentz L, PA   baclofen (LIORESAL) 10 MG tablet Take 1 tablet (10 mg total) by mouth 3 (three) times daily. As needed. Sedation precaution 30 each Rondle Lohse L, PA      PDMP not reviewed this encounter.   Maretta Bees, [place] 03/02/22 1219

## 2022-03-02 NOTE — ED Triage Notes (Signed)
Patient complains of dental pain x 3 weeks come and goes. Has tried to call dental offices but unable to get an appt. Took IBU 800mg  twice without any relief.

## 2022-03-02 NOTE — Discharge Instructions (Addendum)
I have prescribed you an antibiotic.  Please start taking this twice daily with food until gone.  Take with yogurt to prevent vaginal yeast infection. Please call the providers listed on the dental resources list to get a follow-up. Take the naproxen twice daily with food to help with pain and inflammation. Use the baclofen up to 3 times daily as needed as I suspect you have TMJ.  Use with caution as this can cause you to feel tired. Chew soft foods.  Avoid gum. Please read the attached handout for further information and instructions.

## 2022-03-25 ENCOUNTER — Emergency Department (HOSPITAL_COMMUNITY): Payer: Medicaid Other

## 2022-03-25 ENCOUNTER — Encounter (HOSPITAL_COMMUNITY): Payer: Self-pay | Admitting: Emergency Medicine

## 2022-03-25 ENCOUNTER — Other Ambulatory Visit: Payer: Self-pay

## 2022-03-25 ENCOUNTER — Emergency Department (HOSPITAL_COMMUNITY)
Admission: EM | Admit: 2022-03-25 | Discharge: 2022-03-25 | Disposition: A | Discharge status: Home / Self Care | Payer: Medicaid Other | Attending: Emergency Medicine | Admitting: Emergency Medicine

## 2022-03-25 DIAGNOSIS — R0602 Shortness of breath: Secondary | ICD-10-CM | POA: Insufficient documentation

## 2022-03-25 DIAGNOSIS — R002 Palpitations: Secondary | ICD-10-CM | POA: Insufficient documentation

## 2022-03-25 DIAGNOSIS — F419 Anxiety disorder, unspecified: Secondary | ICD-10-CM | POA: Diagnosis not present

## 2022-03-25 DIAGNOSIS — R42 Dizziness and giddiness: Secondary | ICD-10-CM | POA: Insufficient documentation

## 2022-03-25 LAB — COMPREHENSIVE METABOLIC PANEL
ALT: 13 U/L (ref 0–44)
AST: 18 U/L (ref 15–41)
Albumin: 3.8 g/dL (ref 3.5–5.0)
Alkaline Phosphatase: 59 U/L (ref 38–126)
Anion gap: 9 (ref 5–15)
BUN: 11 mg/dL (ref 6–20)
CO2: 22 mmol/L (ref 22–32)
Calcium: 9.1 mg/dL (ref 8.9–10.3)
Chloride: 109 mmol/L (ref 98–111)
Creatinine, Ser: 0.59 mg/dL (ref 0.44–1.00)
GFR, Estimated: >60 mL/min (ref >60)
Glucose, Bld: 102 mg/dL — ABNORMAL HIGH (ref 70–99)
Potassium: 3.8 mmol/L (ref 3.5–5.1)
Sodium: 140 mmol/L (ref 135–145)
Total Bilirubin: 0.3 mg/dL (ref 0.3–1.2)
Total Protein: 6.8 g/dL (ref 6.5–8.1)

## 2022-03-25 LAB — CBC
HCT: 31.7 % — ABNORMAL LOW (ref 36.0–46.0)
Hemoglobin: 10.4 g/dL — ABNORMAL LOW (ref 12.0–15.0)
MCH: 29.4 pg (ref 26.0–34.0)
MCHC: 32.8 g/dL (ref 30.0–36.0)
MCV: 89.5 fL (ref 80.0–100.0)
Platelets: 362 10*3/uL (ref 150–400)
RBC: 3.54 MIL/uL — ABNORMAL LOW (ref 3.87–5.11)
RDW: 14.6 % (ref 11.5–15.5)
WBC: 7.1 10*3/uL (ref 4.0–10.5)
nRBC: 0 % (ref 0.0–0.2)

## 2022-03-25 LAB — URINALYSIS, ROUTINE W REFLEX MICROSCOPIC
Bilirubin Urine: NEGATIVE
Glucose, UA: NEGATIVE mg/dL
Hgb urine dipstick: NEGATIVE
Ketones, ur: NEGATIVE mg/dL
Leukocytes,Ua: NEGATIVE
Nitrite: NEGATIVE
Protein, ur: NEGATIVE mg/dL
Specific Gravity, Urine: 1.024 (ref 1.005–1.030)
pH: 5 (ref 5.0–8.0)

## 2022-03-25 LAB — LIPASE, BLOOD: Lipase: 24 U/L (ref 11–51)

## 2022-03-25 MED ORDER — ONDANSETRON HCL 4 MG/2ML IJ SOLN
4.0000 mg | Freq: Once | INTRAMUSCULAR | Status: AC
  Administered 2022-03-25: 4 mg via INTRAVENOUS
  Filled 2022-03-25: qty 2

## 2022-03-25 MED ORDER — ACETAMINOPHEN 325 MG PO TABS
650.0000 mg | ORAL_TABLET | Freq: Once | ORAL | Status: AC
  Administered 2022-03-25: 650 mg via ORAL
  Filled 2022-03-25: qty 2

## 2022-03-25 MED ORDER — KETOROLAC TROMETHAMINE 15 MG/ML IJ SOLN
15.0000 mg | Freq: Once | INTRAMUSCULAR | Status: AC
  Administered 2022-03-25: 15 mg via INTRAVENOUS
  Filled 2022-03-25: qty 1

## 2022-03-25 MED ORDER — ALBUTEROL SULFATE HFA 108 (90 BASE) MCG/ACT IN AERS: 2.0000 | INHALATION_SPRAY | RESPIRATORY_TRACT | Status: DC | PRN

## 2022-03-25 MED ORDER — LORAZEPAM 2 MG/ML IJ SOLN
1.0000 mg | Freq: Once | INTRAMUSCULAR | Status: AC
  Administered 2022-03-25: 1 mg via INTRAVENOUS
  Filled 2022-03-25: qty 1

## 2022-03-25 MED ORDER — SODIUM CHLORIDE 0.9 % IV BOLUS
1000.0000 mL | Freq: Once | INTRAVENOUS | Status: AC
Start: 2022-03-25 — End: 2022-03-25
  Administered 2022-03-25: 1000 mL via INTRAVENOUS

## 2022-03-25 NOTE — ED Provider Notes (Signed)
[facility]-EMERGENCY DEPT Provider Note   CSN: [id number] Arrival date & time: 03/25/22  1138     History  Chief Complaint  Patient presents with   Shortness of Breath   Palpitations   Dizziness    Kristina Moyer is a 41 y.o. female with past medical history significant for chronic pancreatitis, alcoholism, tobacco abuse who presents with concern for shortness of breath, dizziness, palpitations for the last 4 days.  She denies any chest pain.  Patient reports that she began feeling nauseous around 4 days ago, and has been feeling dizzy, lightheaded with moving around.  She reports that she recently had some dental work done on the right, and is having some for pain in the right-sided jaw.  Patient reports that she has also had a lot of life stress and has been struggling with anxiety.  Patient does not take any medication that she herself has been prescribed but reports that she takes her mother's doxepin for anxiety and difficulty with appetite, reports that when she is not taking this medication she is struggles with depression, anxiety, and weight loss.  Patient reports that her feeling of dizziness, palpitations, shortness of breath is worse with walking around, she denies any abdominal pain, diarrhea, constipation, dysuria, vaginal discharge.   Shortness of Breath Palpitations Associated symptoms: dizziness and shortness of breath   Dizziness Associated symptoms: palpitations and shortness of breath        Home Medications Prior to Admission medications   Medication Sig Start Date End Date Taking? Authorizing Provider  baclofen (LIORESAL) 10 MG tablet Take 1 tablet (10 mg total) by mouth 3 (three) times daily. As needed. Sedation precaution 03/02/22   Guy Sandifer L, PA  naproxen sodium (ANAPROX DS) 550 MG tablet Take 1 tablet (550 mg total) by mouth 2 (two) times daily with a meal. 03/02/22   Crain, Whitney L, PA      Allergies    Patient has no known  allergies.    Review of Systems   Review of Systems  Respiratory:  Positive for shortness of breath.   Cardiovascular:  Positive for palpitations.  Neurological:  Positive for dizziness.  All other systems reviewed and are negative.   Physical Exam Updated Vital Signs BP 124/83   Pulse 75   Temp 98.9 F (37.2 C) (Oral)   Resp 17   LMP 02/12/2022 (Approximate)   SpO2 100%  Physical Exam Vitals and nursing note reviewed.  Constitutional:      General: She is not in acute distress.    Appearance: Normal appearance.  HENT:     Head: Normocephalic and atraumatic.  Eyes:     General:        Right eye: No discharge.        Left eye: No discharge.  Cardiovascular:     Rate and Rhythm: Normal rate and regular rhythm.     Heart sounds: No murmur heard.    No friction rub. No gallop.  Pulmonary:     Effort: Pulmonary effort is normal.     Breath sounds: Normal breath sounds.  Abdominal:     General: Bowel sounds are normal.     Palpations: Abdomen is soft.  Skin:    General: Skin is warm and dry.     Capillary Refill: Capillary refill takes less than 2 seconds.  Neurological:     Mental Status: She is alert and oriented to person, place, and time.     Comments: Cranial  nerves II through XII grossly intact.  Intact finger-nose, intact heel-to-shin.  Romberg negative, gait normal.  Alert and oriented x3.  Moves all 4 limbs spontaneously, normal coordination.  No pronator drift.  Intact strength 5 out of 5 bilateral upper and lower extremities.    Psychiatric:        Mood and Affect: Mood is anxious.        Behavior: Behavior normal.     ED Results / Procedures / Treatments   Labs (all labs ordered are listed, but only abnormal results are displayed) Labs Reviewed  CBC - Abnormal; Notable for the following components:      Result Value   RBC 3.54 (*)    Hemoglobin 10.4 (*)    HCT 31.7 (*)    All other components within normal limits  URINALYSIS, ROUTINE W REFLEX  MICROSCOPIC - Abnormal; Notable for the following components:   APPearance HAZY (*)    All other components within normal limits  COMPREHENSIVE METABOLIC PANEL - Abnormal; Notable for the following components:   Glucose, Bld 102 (*)    All other components within normal limits  LIPASE, BLOOD    EKG None  Radiology DG Chest 2 View  Result Date: 03/25/2022 CLINICAL DATA:  Short of breath.  Fast heart rate. EXAM: CHEST - 2 VIEW COMPARISON:  04/08/2015. FINDINGS: Normal heart, mediastinum and hila. Clear lungs.  No pleural effusion or pneumothorax. Skeletal structures are within normal limits. IMPRESSION: Normal chest radiographs. Electronically Signed   By: Amie Portland M.D.   On: 03/25/2022 12:04    Procedures Procedures    Medications Ordered in ED Medications  albuterol (VENTOLIN HFA) 108 (90 Base) MCG/ACT inhaler 2 puff (has no administration in time range)  sodium chloride 0.9 % bolus 1,000 mL (1,000 mLs Intravenous New Bag/Given 03/25/22 1251)  LORazepam (ATIVAN) injection 1 mg (1 mg Intravenous Given 03/25/22 1253)  ketorolac (TORADOL) 15 MG/ML injection 15 mg (15 mg Intravenous Given 03/25/22 1251)  acetaminophen (TYLENOL) tablet 650 mg (650 mg Oral Given 03/25/22 1255)    ED Course/ Medical Decision Making/ A&P                           Medical Decision Making Amount and/or Complexity of Data Reviewed Labs: ordered. Radiology: ordered.  Risk OTC drugs. Prescription drug management.   This patient is a 41 y.o. female who presents to the ED for concern of shortness of breath, dizziness, palpitations, this involves an extensive number of treatment options, and is a complaint that carries with it a high risk of complications and morbidity. The emergent differential diagnosis prior to evaluation includes, but is not limited to, anxiety, stroke, vertigo, BPPV, arrhythmia, atypical ACS, pneumonia, bronchitis, asthma, anemia, electrolyte abnormality versus other.   This is not an  exhaustive differential.   Past Medical History / Co-morbidities / Social History: History of alcohol abuse, chronic pancreatitis, anxiety, tobacco use  Additional history: Chart reviewed. Pertinent results include: Reviewed lab work, imaging from recent previous emergency department visits, urgent care visits  Physical Exam: Physical exam performed. The pertinent findings include: Patient with no acute neurologic deficits on my exam, she has no arrhythmia, and no accessory breath sounds, she is breathing easily with equal breath sounds bilaterally.  Lab Tests: I ordered, and personally interpreted labs.  The pertinent results include: CBC remarkable for mild anemia, hemoglobin 10.4 which is close to patient's baseline if not slightly improved from recent.  Her  CMP is unremarkable, urinalysis unremarkable, lipase negative.   Imaging Studies: I ordered imaging studies including chest x-ray. I independently visualized and interpreted imaging which showed no intrathoracic abnormality. I agree with the radiologist interpretation.   Cardiac Monitoring:  The patient was maintained on a cardiac monitor.  My attending physician Dr. Donnald Garre viewed and interpreted the cardiac monitored which showed an underlying rhythm of: NSR. I agree with this interpretation.   Medications: I ordered medication including fluid bolus, Tylenol, Ativan, Toradol for pain, anxiety, questionable dehydration. Reevaluation of the patient after these medicines showed that the patient improved. I have reviewed the patients home medicines and have made adjustments as needed.   Disposition: After consideration of the diagnostic results and the patients response to treatment, I feel that patient is stable for discharge at this time, think that she would benefit from establishing care with PCP, mental health counselor, considered ambulatory referral to cardiology, but I do not think that her primary issue is arrhythmia at this  time based on her presentation, but may consider this route in the future if patient continues to have feelings of palpitations.  She had no documented abnormal heart rhythm during her entire visit today.   emergency department workup does not suggest an emergent condition requiring admission or immediate intervention beyond what has been performed at this time. The plan is: As above. The patient is safe for discharge and has been instructed to return immediately for worsening symptoms, change in symptoms or any other concerns.  I discussed this case with my attending physician Dr. Donnald Garre who cosigned this note including patient's presenting symptoms, physical exam, and planned diagnostics and interventions. Attending physician stated agreement with plan or made changes to plan which were implemented.    Final Clinical Impression(s) / ED Diagnoses Final diagnoses:  Shortness of breath  Anxiety    Rx / DC Orders ED Discharge Orders     None         [place] 03/25/22 1340    Arby Barrette, MD 03/25/22 1626

## 2022-03-25 NOTE — Discharge Instructions (Addendum)
Do not see any apparent cause of your feeling of shortness of breath, or palpitations today, have some suspicion that it may have to do with decreased oral intake from your recent dental procedure, as well as anxiety, I recommend talking to a primary care doctor, or counseling services about beginning a medication to help with your stress.  If you have worsening symptoms, new chest pain, worsening shortness of breath please return to the emergency department for further evaluation.

## 2022-03-25 NOTE — ED Triage Notes (Signed)
Pt reports SHOB, dizziness, and palpitations x  4 days. Pt denies chest pain.

## 2022-03-27 ENCOUNTER — Ambulatory Visit: Payer: Self-pay | Admitting: *Deleted

## 2022-03-27 NOTE — Telephone Encounter (Signed)
Chief Complaint: anxiety and fast heartrate Symptoms: no symptoms now . Reports feeling anxiety at times and gittery when waking up. C/o fast heart rate at times. C/o depression, poor appetite, has had thoughts of hurting self but reports she will not act on it due to thinking of her children. Recent teeth extraction and reports feeling lightheaded and dizzy when heart rate is fast.  Frequency: 2 weeks Pertinent Negatives: Patient denies chest pain no difficulty breathing no anxiety now  Disposition: [] ED /[] Urgent Care (no appt availability in office) / [] Appointment(In office/virtual)/ []  [facility]/ [] Home Care/ [] Refused Recommended Disposition /[x] [facility]/ []  Follow-up with PCP Additional Notes:   Recommended Mobile bus if needed medication for anxiety today . Recommended ED if chest pain heart rate fast or difficulty breathing occurs. Gave information for suicide hot line "988" text and information to Urgent crisis center. Gave # for Internal Medicine center at [place] to set up PCP to assist with management of sx.    Reason for Disposition  [1] Symptoms of anxiety or panic attack AND [2] is a chronic symptom (recurrent or ongoing AND present > 4 weeks)  Answer Assessment - Initial Assessment Questions 1. CONCERN: "Did anything happen that prompted you to call today?"      Anxious and feeling heart rate fast  2. ANXIETY SYMPTOMS: "Can you describe how you (your loved one; patient) have been feeling?" (e.g., tense, restless, panicky, anxious, keyed up, overwhelmed, sense of impending doom).      Tense anxious at times. 3. ONSET: "How long have you been feeling this way?" (e.g., hours, days, weeks)     2 weeks  4. SEVERITY: "How would you rate the level of anxiety?" (e.g., 0 - 10; or mild, moderate, severe).     na 5. FUNCTIONAL IMPAIRMENT: "How have these feelings affected your ability to do daily activities?" "Have you had more difficulty than usual doing  your normal daily activities?" (e.g., getting better, same, worse; self-care, school, work, interactions)     Able to work but by noon feels heart racing, breathing heavy 6. HISTORY: "Have you felt this way before?" "Have you ever been diagnosed with an anxiety problem in the past?" (e.g., generalized anxiety disorder, panic attacks, PTSD). If Yes, ask: "How was this problem treated?" (e.g., medicines, counseling, etc.)     Yes  7. RISK OF HARM - SUICIDAL IDEATION: "Do you ever have thoughts of hurting or killing yourself?" If Yes, ask:  "Do you have these feelings now?" "Do you have a plan on how you would do this?"     Has had thoughts of "wanting God to take me on". But she reports thinking of her children and does not plan on acting on feelings  8. TREATMENT:  "What has been done so far to treat this anxiety?" (e.g., medicines, relaxation strategies). "What has helped?"     na 9. TREATMENT - THERAPIST: "Do you have a counselor or therapist? Name?"     na 10. POTENTIAL TRIGGERS: "Do you drink caffeinated beverages (e.g., coffee, colas, teas), and how much daily?" "Do you drink alcohol or use any drugs?" "Have you started any new medicines recently?"       Na  11. PATIENT SUPPORT: "Who is with you now?" "Who do you live with?" "Do you have family or friends who you can talk to?"        na 12. OTHER SYMPTOMS: "Do you have any other symptoms?" (e.g., feeling depressed, trouble concentrating,  trouble sleeping, trouble breathing, palpitations or fast heartbeat, chest pain, sweating, nausea, or diarrhea)       None now. Does have fast heartbeat at times and anxiety 13. PREGNANCY: "Is there any chance you are pregnant?" "When was your last menstrual period?"       na  Protocols used: Anxiety and Panic Attack-A-AH

## 2022-04-16 ENCOUNTER — Ambulatory Visit (INDEPENDENT_AMBULATORY_CARE_PROVIDER_SITE_OTHER): Payer: Medicaid Other | Admitting: Primary Care

## 2022-04-16 ENCOUNTER — Encounter (INDEPENDENT_AMBULATORY_CARE_PROVIDER_SITE_OTHER): Payer: Self-pay | Admitting: Primary Care

## 2022-04-16 VITALS — BP 111/72 | HR 73 | Resp 16 | Ht 68.5 in | Wt 156.4 lb

## 2022-04-16 DIAGNOSIS — F32A Depression, unspecified: Secondary | ICD-10-CM | POA: Diagnosis not present

## 2022-04-16 DIAGNOSIS — D638 Anemia in other chronic diseases classified elsewhere: Secondary | ICD-10-CM | POA: Diagnosis not present

## 2022-04-16 DIAGNOSIS — Z09 Encounter for follow-up examination after completed treatment for conditions other than malignant neoplasm: Secondary | ICD-10-CM

## 2022-04-16 DIAGNOSIS — Z7689 Persons encountering health services in other specified circumstances: Secondary | ICD-10-CM

## 2022-04-16 DIAGNOSIS — K860 Alcohol-induced chronic pancreatitis: Secondary | ICD-10-CM

## 2022-04-16 DIAGNOSIS — F419 Anxiety disorder, unspecified: Secondary | ICD-10-CM

## 2022-04-16 MED ORDER — DOXEPIN HCL 25 MG PO CAPS
25.0000 mg | ORAL_CAPSULE | Freq: Every evening | ORAL | 0 refills | Status: DC | PRN
Start: 2022-04-16 — End: 2022-05-07

## 2022-04-16 NOTE — Progress Notes (Signed)
[facility]   Subjective: 03/25/22     Kristina Moyer is a 41 y.o. female slightly under weight Body mass index is 23.43 kg/m. presents for hospital follow up and establish care. She presented to the ED on 03/25/22 with concern for shortness of breath, dizziness, palpitations for the last 4 days and been feeling dizzy, lightheaded with moving around.  Patient reports that she has also had a lot of life stressors and has been struggling with anxiety and depression.  In some particular instances her anxiety and depression was out of control her mother was concerned and offered her doxepin she realized that this medication actually made her feel better.  Her niece is a patient of Probation officer and  advised her to establish care with Kristina Moyer. Today she is here for management of anxiety and depression. Her anxiety is situational her son at the age of 73 was hit by a truck while riding his bike- he is paralyzed from the waist down and she is his main caregiver . He is now 68 and has a decubitus ulcer and followed by the wound center. As a mother she relives this incident daily. "Why my son" Past Medical History:  Diagnosis Date   Alcoholism ([place])    Anxiety    Pancreatitis, chronic ([facility])     No Known Allergies  Current Outpatient Medications on File Prior to Visit  Medication Sig Dispense Refill   baclofen (LIORESAL) 10 MG tablet Take 1 tablet (10 mg total) by mouth 3 (three) times daily. As needed. Sedation precaution (Patient not taking: Reported on 04/16/2022) 30 each 0   naproxen sodium (ANAPROX DS) 550 MG tablet Take 1 tablet (550 mg total) by mouth 2 (two) times daily with a meal. (Patient not taking: Reported on 04/16/2022) 14 tablet 0   No current facility-administered medications on file prior to visit.     Review of System: .Comprehensive ROS Pertinent positive and negative noted in HPI    Objective:  BP 111/72   Pulse 73   Resp 16   Ht 5' 8.5" (1.74 m)   Wt 156 lb 6.4  oz (70.9 kg)   SpO2 100%   BMI 23.43 kg/m   Filed Weights   04/16/22 1449  Weight: 156 lb 6.4 oz (70.9 kg)   Physical Exam: General Appearance: Well nourished, in no apparent distress. Eyes: PERRLA, EOMs, conjunctiva no swelling or erythema Sinuses: No Frontal/maxillary tenderness ENT/Mouth: Ext aud canals clear, TMs without erythema, bulging.  Hearing normal.  Neck: Supple, thyroid normal.  Respiratory: Respiratory effort normal, BS equal bilaterally without rales, rhonchi, wheezing or stridor.  Cardio: RRR with no MRGs. Brisk peripheral pulses without edema.  Abdomen: Soft, + BS.  Non tender, no guarding, rebound, hernias, masses. Lymphatics: Non tender without lymphadenopathy.  Musculoskeletal: Full ROM, 5/5 strength, normal gait.  Skin: Warm, dry without rashes, lesions, ecchymosis.  Neuro: Cranial nerves intact. Normal muscle tone, no cerebellar symptoms. Sensation intact.  Psych: Awake and oriented X 3, normal affect, Insight and Judgment appropriate.   Assessment:  Kristina Moyer was seen today for hospitalization follow-up.  Diagnoses and all orders for this visit:  Hospital discharge follow-up Follow-Ups: Call [facility]; To help establish a primary care doctor- completed   Alcohol-induced chronic pancreatitis ([place]) Stopped drinking 2.5 years ago stomach pain became unbearable  Anxiety and depression -     doxepin (SINEQUAN) 25 MG capsule; Take 1 capsule (25 mg total) by mouth at bedtime as needed.  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 Any transcriptional errors are unintentional.   Kristina Sessions, NP 04/16/2022, 2:51 PM

## 2022-04-16 NOTE — Patient Instructions (Signed)
Doxepin Capsules What is this medication? DOXEPIN (DOX e pin) treats depression and anxiety. It increases the amount of serotonin and norepinephrine in the brain, hormones that help regulate mood. It belongs to a group of medications called tricyclic antidepressants (TCAs). This medicine may be used for other purposes; ask your health care provider or pharmacist if you have questions. COMMON BRAND NAME(S): Sinequan What should I tell my care team before I take this medication? They need to know if you have any of these conditions: Bipolar disorder Difficulty passing urine Glaucoma Heart disease If you frequently drink alcohol containing drinks Liver disease Lung or breathing disease, like asthma or sleep apnea Prostate trouble Schizophrenia Seizures Suicidal thoughts, plans, or attempt; a previous suicide attempt by you or a family member An unusual or allergic reaction to doxepin, other medications, foods, dyes, or preservatives Pregnant or trying to get pregnant Breast-feeding How should I use this medication? Take this medication by mouth with a glass of water. Follow the directions on the prescription label. Take your doses at regular intervals. Do not take your medication more often than directed. Do not stop taking this medication suddenly except upon the advice of your care team. Stopping this medication too quickly may cause serious side effects or your condition may worsen. A special MedGuide will be given to you by the pharmacist with each prescription and refill. Be sure to read this information carefully each time. Talk to your care team about the use of this medication in children. While this medication may be prescribed for children as young as 12 years for selected conditions, precautions do apply. Overdosage: If you think you have taken too much of this medicine contact a poison control center or emergency room at once. NOTE: This medicine is only for you. Do not share this  medicine with others. What if I miss a dose? If you miss a dose, take it as soon as you can. If it is almost time for your next dose, take only that dose. Do not take double or extra doses. What may interact with this medication? Do not take this medication with any of the following: Arsenic trioxide Certain medications used to regulate abnormal heartbeat or to treat other heart conditions Cisapride Halofantrine Levomethadyl Linezolid MAOIs like Carbex, Eldepryl, Marplan, Nardil, and Parnate Methylene blue Other medications for mental depression Phenothiazines like perphenazine, thioridazine and chlorpromazine Pimozide Procarbazine Sparfloxacin St. John's Wort This medication may also interact with the following: Cimetidine Tolazamide Ziprasidone This list may not describe all possible interactions. Give your health care provider a list of all the medicines, herbs, non-prescription drugs, or dietary supplements you use. Also tell them if you smoke, drink alcohol, or use illegal drugs. Some items may interact with your medicine. What should I watch for while using this medication? Visit your care team for regular checks on your progress. It can take several days before you feel the full effect of this medication. If you have been taking this medication regularly for some time, do not suddenly stop taking it. You must gradually reduce the dose or you may get severe side effects. Ask your care team for advice. Even after you stop taking this medication it can still affect your body for several days. Patients and their families should watch out for new or worsening thoughts of suicide or depression. Also watch out for sudden changes in feelings such as feeling anxious, agitated, panicky, irritable, hostile, aggressive, impulsive, severely restless, overly excited and hyperactive, or not being able   to sleep. If this happens, especially at the beginning of treatment or after a change in dose,  call your care team. You may get drowsy or dizzy. Do not drive, use machinery, or do anything that needs mental alertness until you know how this medication affects you. Do not stand or sit up quickly, especially if you are an older patient. This reduces the risk of dizzy or fainting spells. Alcohol may increase dizziness and drowsiness. Avoid alcoholic drinks. Do not treat yourself for coughs, colds, or allergies without asking your care team for advice. Some ingredients can increase possible side effects. Your mouth may get dry. Chewing sugarless gum or sucking hard candy, and drinking plenty of water may help. Contact your care team if the problem does not go away or is severe. This medication may cause dry eyes and blurred vision. If you wear contact lenses you may feel some discomfort. Lubricating drops may help. See your care team if the problem does not go away or is severe. This medication can make you more sensitive to the sun. Keep out of the sun. If you cannot avoid being in the sun, wear protective clothing and use sunscreen. Do not use sun lamps or tanning beds/booths. What side effects may I notice from receiving this medication? Side effects that you should report to your care team as soon as possible: Allergic reactions--skin rash, itching, hives, swelling of the face, lips, tongue, or throat Irritability, confusion, fast or irregular heartbeat, muscle stiffness, twitching muscles, sweating, high fever, seizure, chills, vomiting, diarrhea, which may be signs of serotonin syndrome Sudden eye pain or change in vision such as blurry vision, seeing halos around lights, vision loss Thoughts of suicide or self-harm, worsening mood, or feelings of depression Trouble passing urine Side effects that usually do not require medical attention (report to your care team if they continue or are bothersome): Change in sex drive or performance Constipation Dizziness Drowsiness Dry  mouth Tremors Weight gain This list may not describe all possible side effects. Call your doctor for medical advice about side effects. You may report side effects to FDA at 1-800-FDA-1088. Where should I keep my medication? Keep out of the reach of children. Store at room temperature between 15 and 30 degrees C (59 and 86 degrees F). Throw away any unused medication after the expiration date. NOTE: This sheet is a summary. It may not cover all possible information. If you have questions about this medicine, talk to your doctor, pharmacist, or health care provider.  2023 Elsevier/Gold Standard (2020-09-21 00:00:00)  

## 2022-05-07 ENCOUNTER — Other Ambulatory Visit (INDEPENDENT_AMBULATORY_CARE_PROVIDER_SITE_OTHER): Payer: Self-pay | Admitting: Primary Care

## 2022-05-07 DIAGNOSIS — F32A Depression, unspecified: Secondary | ICD-10-CM

## 2022-05-07 NOTE — Telephone Encounter (Addendum)
Medication Refill - Medication: 90 day supply doxepin (SINEQUAN) 25 MG capsule  Has the patient contacted their pharmacy? No, patient thought she was suppose to call PCP. Advised patient in the future please call pharmacy first.   Preferred Pharmacy (with phone number or street name):   [id number] [address], [place] Phone:  [phone]  Fax:  [phone]     Has the patient been seen for an appointment in the last year OR does the patient have an upcoming appointment? Yes.    Agent: Please be advised that RX refills may take up to 3 business days. We ask that you follow-up with your pharmacy.

## 2022-05-08 MED ORDER — DOXEPIN HCL 25 MG PO CAPS: 25.0000 mg | ORAL_CAPSULE | Freq: Every evening | ORAL | 0 refills | Status: DC | PRN

## 2022-05-08 NOTE — Telephone Encounter (Signed)
Requested Prescriptions  Pending Prescriptions Disp Refills  . doxepin (SINEQUAN) 25 MG capsule 90 capsule 0    Sig: Take 1 capsule (25 mg total) by mouth at bedtime as needed.     Psychiatry:  Antidepressants - Heterocyclics (TCAs) Passed - 05/07/2022 11:19 AM      Passed - Valid encounter within last 6 months    Recent Outpatient Visits          3 weeks ago Hospital discharge follow-up   [place], [place], NP      Future Appointments            In 1 week Oletta Lamas, [place]age, NP [place]          Psychiatry:  Antidepressants - Heterocyclics (TCAs) - doxepin Passed - 05/07/2022 11:19 AM      Passed - Valid encounter within last 12 months    Recent Outpatient Visits          3 weeks ago Hospital discharge follow-up   [place], [place], NP      Future Appointments            In 1 week Kerin Perna, NP [place]

## 2022-05-21 ENCOUNTER — Telehealth (INDEPENDENT_AMBULATORY_CARE_PROVIDER_SITE_OTHER): Payer: Medicaid Other | Admitting: Primary Care

## 2022-05-21 NOTE — Progress Notes (Deleted)
  [place]  Virtual Visit via Telephone Note  I connected with Kristina Moyer, on 05/21/2022 at 2:35 PM ***through an audio and video application or by ***telephone and verified that I am speaking with the correct person using two identifiers.   Consent: I discussed the limitations, risks, security and privacy concerns of performing an evaluation and management service by telephone and the availability of in person appointments. I also discussed with the patient that there may be a patient responsible charge related to this service. The patient expressed understanding and agreed to proceed.   Location of Patient: ***  Location of Provider: [facility]   Persons participating in Telemedicine visit: Midge Aver,  NP [place]ana Aliment , CMA     History of Present Illness: ***   Past Medical History:  Diagnosis Date   Alcoholism ([place])    Anxiety    Pancreatitis, chronic ([place])    No Known Allergies  Current Outpatient Medications on File Prior to Visit  Medication Sig Dispense Refill   baclofen (LIORESAL) 10 MG tablet Take 1 tablet (10 mg total) by mouth 3 (three) times daily. As needed. Sedation precaution (Patient not taking: Reported on 04/16/2022) 30 each 0   doxepin (SINEQUAN) 25 MG capsule Take 1 capsule (25 mg total) by mouth at bedtime as needed. 90 capsule 0   naproxen sodium (ANAPROX DS) 550 MG tablet Take 1 tablet (550 mg total) by mouth 2 (two) times daily with a meal. (Patient not taking: Reported on 04/16/2022) 14 tablet 0   No current facility-administered medications on file prior to visit.    Observations/Objective: ***  Assessment and Plan: ***  Follow Up Instructions: ***   I discussed the assessment and treatment plan with the patient. The patient was provided an opportunity to ask questions and all were answered. The patient agreed with the plan and demonstrated  an understanding of the instructions.   The patient was advised to call back or seek an in-person evaluation if the symptoms worsen or if the condition fails to improve as anticipated.     I provided *** minutes total of non-face-to-face time during this encounter including median intraservice time, reviewing previous notes, investigations, ordering medications, medical decision making, coordinating care and patient verbalized understanding at the end of the visit.    This note has been created with Surveyor, quantity. Any transcriptional errors are unintentional.   Kerin Perna, NP 05/21/2022, 2:35 PM

## 2022-06-19 ENCOUNTER — Ambulatory Visit (INDEPENDENT_AMBULATORY_CARE_PROVIDER_SITE_OTHER): Payer: Medicaid Other | Admitting: Primary Care

## 2022-06-19 DIAGNOSIS — R300 Dysuria: Secondary | ICD-10-CM

## 2022-06-19 DIAGNOSIS — F32A Depression, unspecified: Secondary | ICD-10-CM | POA: Diagnosis not present

## 2022-06-19 DIAGNOSIS — F419 Anxiety disorder, unspecified: Secondary | ICD-10-CM | POA: Diagnosis not present

## 2022-06-19 NOTE — Progress Notes (Signed)
  [facility]   Telephone Note  I connected with Kristina Moyer, on 06/19/2022 at 3:56 PM telephone and verified that I am speaking with the correct person using two identifiers.   Consent: I discussed the limitations, risks, security and privacy concerns of performing an evaluation and management service by telephone and the availability of in person appointments. I also discussed with the patient that there may be a patient responsible charge related to this service. The patient expressed understanding and agreed to proceed.   Location of Patient: home  Location of Provider:  Primary Care at [facility]   Persons participating in Telemedicine visit: Romona Curls,  NP  History of Present Illness: Ms. Kristina Hilbun. Moyer is a 41 year old female tele visit for anxiety and depressed started doxepin (SINEQUAN) 25 MG capsule feels medication has helped. She was recently was fired. She lives with her mother and disable son and other children.  Not that her mother has been taking her made her feel unwanted patient has brought this daughter upon herself causing more thoughts anxiety and depression 40 no job living at home from mom.  There is always a bright side she is able to live with her mom and her kids there is a roof over their head and no one goes to bed hungry patient agreed. Past Medical History:  Diagnosis Date   Alcoholism (HCC)    Anxiety    Pancreatitis, chronic (HCC)    No Known Allergies  Current Outpatient Medications on File Prior to Visit  Medication Sig Dispense Refill   baclofen (LIORESAL) 10 MG tablet Take 1 tablet (10 mg total) by mouth 3 (three) times daily. As needed. Sedation precaution (Patient not taking: Reported on 04/16/2022) 30 each 0   doxepin (SINEQUAN) 25 MG capsule Take 1 capsule (25 mg total) by mouth at bedtime as needed. 90 capsule 0   naproxen sodium (ANAPROX DS) 550 MG tablet Take 1  tablet (550 mg total) by mouth 2 (two) times daily with a meal. (Patient not taking: Reported on 04/16/2022) 14 tablet 0   No current facility-administered medications on file prior to visit.   Observations/Objective: .There were no vitals taken for this visit.   Assessment and Plan: Diagnoses and all orders for this visit:  Anxiety and depression Continue and refill doxepin take at bedtime.  Dysuria Advised patient to come in for a UA for treatment-timeframes we will discuss cleaning hours     Follow Up Instructions: 3 months   I discussed the assessment and treatment plan with the patient. The patient was provided an opportunity to ask questions and all were answered. The patient agreed with the plan and demonstrated an understanding of the instructions.   The patient was advised to call back or seek an in-person evaluation if the symptoms worsen or if the condition fails to improve as anticipated.     I provided 20 minutes minutes total of non-face-to-face time during this encounter including median intraservice time, reviewing previous notes, investigations, ordering medications, medical decision making, coordinating care and patient verbalized understanding at the end of the visit.    This note has been created with Education officer, environmental. Any transcriptional errors are unintentional.   Grayce Sessions, NP 06/19/2022, 3:56 PM

## 2022-06-24 MED ORDER — DOXEPIN HCL 50 MG PO CAPS: 50.0000 mg | ORAL_CAPSULE | Freq: Every day | ORAL | 1 refills | Status: DC

## 2022-07-31 ENCOUNTER — Ambulatory Visit (INDEPENDENT_AMBULATORY_CARE_PROVIDER_SITE_OTHER): Payer: Medicaid Other | Admitting: Primary Care

## 2022-12-24 ENCOUNTER — Other Ambulatory Visit (INDEPENDENT_AMBULATORY_CARE_PROVIDER_SITE_OTHER): Payer: Self-pay | Admitting: Primary Care

## 2022-12-24 DIAGNOSIS — F32A Depression, unspecified: Secondary | ICD-10-CM

## 2022-12-25 NOTE — Telephone Encounter (Signed)
OV needed for additional refills.  Requested Prescriptions  Pending Prescriptions Disp Refills   doxepin (SINEQUAN) 50 MG capsule [Pharmacy Med Name: DOXEPIN 50 MG CAPSULE] 90 capsule 0    Sig: TAKE 1 CAPSULE BY MOUTH AT BEDTIME.     Psychiatry:  Antidepressants - Heterocyclics (TCAs) Failed - 12/24/2022 11:04 AM      Failed - Valid encounter within last 6 months    Recent Outpatient Visits           6 months ago Anxiety and depression   [place] [facility] Grayce Sessions, NP   8 months ago Hospital discharge follow-up   [place] [facility] Grayce Sessions, NP             Psychiatry:  Antidepressants - Heterocyclics (TCAs) - doxepin Passed - 12/24/2022 11:04 AM      Passed - Valid encounter within last 12 months    Recent Outpatient Visits           6 months ago Anxiety and depression   [place] [facility] Grayce Sessions, NP   8 months ago Hospital discharge follow-up   [facility] Grayce Sessions, NP

## 2023-02-28 ENCOUNTER — Encounter (HOSPITAL_COMMUNITY): Payer: Self-pay | Admitting: Emergency Medicine

## 2023-02-28 ENCOUNTER — Other Ambulatory Visit: Payer: Self-pay

## 2023-02-28 ENCOUNTER — Ambulatory Visit (HOSPITAL_COMMUNITY)
Admission: EM | Admit: 2023-02-28 | Discharge: 2023-02-28 | Disposition: A | Discharge status: Home / Self Care | Payer: Medicaid Other | Attending: Nurse Practitioner | Admitting: Nurse Practitioner

## 2023-02-28 DIAGNOSIS — R1084 Generalized abdominal pain: Secondary | ICD-10-CM | POA: Diagnosis present

## 2023-02-28 LAB — POCT URINALYSIS DIP (MANUAL ENTRY)
Bilirubin, UA: NEGATIVE
Glucose, UA: NEGATIVE mg/dL
Ketones, POC UA: NEGATIVE mg/dL
Nitrite, UA: NEGATIVE
Protein Ur, POC: NEGATIVE mg/dL
Spec Grav, UA: 1.02 (ref 1.010–1.025)
Urobilinogen, UA: 0.2 E.U./dL
pH, UA: 7 (ref 5.0–8.0)

## 2023-02-28 LAB — COMPREHENSIVE METABOLIC PANEL
ALT: 18 U/L (ref 0–44)
AST: 26 U/L (ref 15–41)
Albumin: 3.9 g/dL (ref 3.5–5.0)
Alkaline Phosphatase: 82 U/L (ref 38–126)
Anion gap: 9 (ref 5–15)
BUN: 9 mg/dL (ref 6–20)
CO2: 22 mmol/L (ref 22–32)
Calcium: 9.4 mg/dL (ref 8.9–10.3)
Chloride: 102 mmol/L (ref 98–111)
Creatinine, Ser: 0.76 mg/dL (ref 0.44–1.00)
GFR, Estimated: >60 mL/min (ref >60)
Glucose, Bld: 90 mg/dL (ref 70–99)
Potassium: 4.2 mmol/L (ref 3.5–5.1)
Sodium: 133 mmol/L — ABNORMAL LOW (ref 135–145)
Total Bilirubin: 0.6 mg/dL (ref 0.3–1.2)
Total Protein: 7.1 g/dL (ref 6.5–8.1)

## 2023-02-28 LAB — LIPASE, BLOOD: Lipase: 26 U/L (ref 11–51)

## 2023-02-28 LAB — CBC WITH DIFFERENTIAL/PLATELET
Abs Immature Granulocytes: 0.03 10*3/uL (ref 0.00–0.07)
Basophils Absolute: 0 10*3/uL (ref 0.0–0.1)
Basophils Relative: 0 %
Eosinophils Absolute: 0.1 10*3/uL (ref 0.0–0.5)
Eosinophils Relative: 1 %
HCT: 35.9 % — ABNORMAL LOW (ref 36.0–46.0)
Hemoglobin: 11.7 g/dL — ABNORMAL LOW (ref 12.0–15.0)
Immature Granulocytes: 0 %
Lymphocytes Relative: 39 %
Lymphs Abs: 2.7 10*3/uL (ref 0.7–4.0)
MCH: 28.8 pg (ref 26.0–34.0)
MCHC: 32.6 g/dL (ref 30.0–36.0)
MCV: 88.4 fL (ref 80.0–100.0)
Monocytes Absolute: 0.6 10*3/uL (ref 0.1–1.0)
Monocytes Relative: 9 %
Neutro Abs: 3.4 10*3/uL (ref 1.7–7.7)
Neutrophils Relative %: 51 %
Platelets: 333 10*3/uL (ref 150–400)
RBC: 4.06 MIL/uL (ref 3.87–5.11)
RDW: 14.5 % (ref 11.5–15.5)
WBC: 6.8 10*3/uL (ref 4.0–10.5)
nRBC: 0 % (ref 0.0–0.2)

## 2023-02-28 NOTE — Discharge Instructions (Signed)
The urine sample today looks stable.  We are sending for a urine culture and will contact you if the results are abnormal early next week.    We are also checking blood work and will contact you tomorrow if the blood work is abnormal.   As we discussed, I am concerned your pancreas is inflamed.  Please only eat ice chips for the 24-48 hours to see if the pain will improve on its own.  If the pain worsens or you develop nausea/vomiting, unable to keep food or fluids down, please go to the ER.  You can take Tylenol for pain.

## 2023-02-28 NOTE — ED Triage Notes (Signed)
Reports abdominal pain started 2 days ago.  Complains of lower back pain and feeling bloated.    Last BM was this morning and considered normal per patient.  Denies vomiting.  Denies burning with urination.  Denies vaginal discharge

## 2023-02-28 NOTE — ED Provider Notes (Signed)
[facility]    CSN: [id number] Arrival date & time: 02/28/23  1619      History   Chief Complaint Chief Complaint  Patient presents with   Abdominal Pain    HPI Kristina Moyer is a 42 y.o. female.   Patient presents with abdominal pain ongoing 2 days.  Reports the pain onset is sudden and severity 10/10.  Describes the pain as sharp and shooting.  The pain lasts for a couple minutes. The patient admits radiation of pain to right side back. The patient denies fever, nausea/vomiting, decreased appetite, diarrhea, constipation, blood in stool, heartburn, rash, dysuria/urinary frequency, and hematuria. Has tried Tylenol PM without relief of symptoms.   Eating and bending over at work makes the pain worse. Patient has been eating and drinking normally per her report.  History of alcoholism and pancreatitis and thinks she has been having a pancreatitis flare up but does not want to go to the hospital.     Past Medical History:  Diagnosis Date   Alcoholism (HCC)    Anxiety    Pancreatitis, chronic (HCC)     Patient Active Problem List   Diagnosis Date Noted   Pyrexia    Alcohol-induced chronic pancreatitis (HCC)    Tobacco abuse    Pancreatitis 12/20/2014   Abdominal pain 12/20/2014   Tobacco use    Acute pancreatitis 11/17/2012   Iron deficiency anemia 11/17/2012   Increased anion gap metabolic acidosis 11/17/2012   Alcohol abuse 11/17/2012    Past Surgical History:  Procedure Laterality Date   TUBAL LIGATION      OB History     Gravida  4   Para  3   Term  3   Preterm      AB  1   Living  3      SAB  1   IAB      Ectopic      Multiple      Live Births               Home Medications    Prior to Admission medications   Medication Sig Start Date End Date Taking? Authorizing Provider  baclofen (LIORESAL) 10 MG tablet Take 1 tablet (10 mg total) by mouth 3 (three) times daily. As needed. Sedation precaution Patient not taking:  Reported on 04/16/2022 03/02/22   Guy Sandifer L, PA  doxepin (SINEQUAN) 50 MG capsule TAKE 1 CAPSULE BY MOUTH AT BEDTIME. 12/25/22   Grayce Sessions, NP  naproxen sodium (ANAPROX DS) 550 MG tablet Take 1 tablet (550 mg total) by mouth 2 (two) times daily with a meal. Patient not taking: Reported on 04/16/2022 03/02/22   Maretta Bees, PA    Family History Family History  Problem Relation Age of Onset   Healthy Mother     Social History Social History   Tobacco Use   Smoking status: Former   Smokeless tobacco: Never  Vaping Use   Vaping status: Every Day  Substance Use Topics   Alcohol use: Yes    Comment: occasional   Drug use: No     Allergies   Patient has no known allergies.   Review of Systems Review of Systems Per HPI  Physical Exam Triage Vital Signs ED Triage Vitals  Encounter Vitals Group     BP 02/28/23 1704 122/85     Systolic BP Percentile --      Diastolic BP Percentile --      Pulse  Rate 02/28/23 1704 89     Resp 02/28/23 1704 18     Temp 02/28/23 1704 98.7 F (37.1 C)     Temp Source 02/28/23 1704 Oral     SpO2 02/28/23 1704 98 %     Weight --      Height --      Head Circumference --      Peak Flow --      Pain Score 02/28/23 1702 10     Pain Loc --      Pain Education --      Exclude from Growth Chart --    No data found.  Updated Vital Signs BP 122/85 (BP Location: Left Arm)   Pulse 89   Temp 98.7 F (37.1 C) (Oral)   Resp 18   LMP 02/13/2023   SpO2 98%   Visual Acuity Right Eye Distance:   Left Eye Distance:   Bilateral Distance:    Right Eye Near:   Left Eye Near:    Bilateral Near:     Physical Exam Vitals and nursing note reviewed.  Constitutional:      General: She is not in acute distress.    Appearance: Normal appearance. She is not toxic-appearing.  HENT:     Head: Normocephalic and atraumatic.     Mouth/Throat:     Mouth: Mucous membranes are moist.     Pharynx: Oropharynx is clear.  Eyes:      General: No scleral icterus.    Extraocular Movements: Extraocular movements intact.  Cardiovascular:     Rate and Rhythm: Regular rhythm. Tachycardia present.  Pulmonary:     Effort: Pulmonary effort is normal. No respiratory distress.     Breath sounds: Normal breath sounds. No wheezing, rhonchi or rales.  Abdominal:     General: Abdomen is flat. Bowel sounds are normal. There is no distension.     Palpations: Abdomen is soft.     Tenderness: There is abdominal tenderness in the epigastric area. There is no right CVA tenderness, left CVA tenderness, guarding or rebound. Negative signs include Murphy's sign, Rovsing's sign and McBurney's sign.    Musculoskeletal:     Cervical back: Normal range of motion.  Lymphadenopathy:     Cervical: No cervical adenopathy.  Skin:    General: Skin is warm and dry.     Capillary Refill: Capillary refill takes less than 2 seconds.     Coloration: Skin is not jaundiced or pale.     Findings: No erythema.  Neurological:     Mental Status: She is alert and oriented to person, place, and time.  Psychiatric:        Behavior: Behavior is cooperative.      UC Treatments / Results  Labs (all labs ordered are listed, but only abnormal results are displayed) Labs Reviewed  POCT URINALYSIS DIP (MANUAL ENTRY) - Abnormal; Notable for the following components:      Result Value   Clarity, UA hazy (*)    Blood, UA trace-intact (*)    Leukocytes, UA Trace (*)    All other components within normal limits  URINE CULTURE  CBC WITH DIFFERENTIAL/PLATELET  LIPASE, BLOOD  COMPREHENSIVE METABOLIC PANEL    EKG   Radiology No results found.  Procedures Procedures (including critical care time)  Medications Ordered in UC Medications - No data to display  Initial Impression / Assessment and Plan / UC Course  I have reviewed the triage vital signs and the nursing notes.  Pertinent labs & imaging results that were available during my care of the  patient were reviewed by me and considered in my medical decision making (see chart for details).   Patient is well-appearing, normotensive, afebrile, not tachycardic, not tachypneic, oxygenating well on room air.    1. Generalized abdominal pain Given patient's symptoms and exam, I am concerned for possible pancreatitis among other etiologies Patient's abdomen does not appear acute, although she is tender in the epigastric area Patient declines ER today Will check blood work to include lipase, CMP, CBC to check for metabolic abnormality We did discuss that if blood work looks poor, we will recommend that she go to the emergency room tomorrow UA today is hazy with trace blood, trace leukocyte esterase, urine culture pending for UTI rule out Recommended only ingesting ice chips until abdominal pain improves to allow for bowel rest since eating makes pain worse, Tylenol for pain Strict ER precautions discussed with patient  The patient was given the opportunity to ask questions.  All questions answered to their satisfaction.  The patient is in agreement to this plan.    Final Clinical Impressions(s) / UC Diagnoses   Final diagnoses:  Generalized abdominal pain     Discharge Instructions      The urine sample today looks stable.  We are sending for a urine culture and will contact you if the results are abnormal early next week.    We are also checking blood work and will contact you tomorrow if the blood work is abnormal.   As we discussed, I am concerned your pancreas is inflamed.  Please only eat ice chips for the 24-48 hours to see if the pain will improve on its own.  If the pain worsens or you develop nausea/vomiting, unable to keep food or fluids down, please go to the ER.  You can take Tylenol for pain.      ED Prescriptions   None    PDMP not reviewed this encounter.   Valentino Nose, NP 02/28/23 1900

## 2023-03-05 ENCOUNTER — Telehealth (HOSPITAL_COMMUNITY): Payer: Self-pay | Admitting: Emergency Medicine

## 2023-03-05 MED ORDER — NITROFURANTOIN MONOHYD MACRO 100 MG PO CAPS: 100.0000 mg | ORAL_CAPSULE | Freq: Two times a day (BID) | ORAL | 0 refills | Status: DC

## 2023-05-28 ENCOUNTER — Other Ambulatory Visit (INDEPENDENT_AMBULATORY_CARE_PROVIDER_SITE_OTHER): Payer: Self-pay | Admitting: Primary Care

## 2023-05-28 DIAGNOSIS — F32A Depression, unspecified: Secondary | ICD-10-CM

## 2023-05-28 DIAGNOSIS — F419 Anxiety disorder, unspecified: Secondary | ICD-10-CM

## 2023-05-28 NOTE — Telephone Encounter (Signed)
Medication Refill - Medication: doxepin (SINEQUAN) 50 MG capsule   Has the patient contacted their pharmacy? yes (Agent: If yes, when and what did the pharmacy advise?)contact pcp  Preferred Pharmacy (with phone number or street name):  [facility] [phone] [place], [place] - [address] Phone: [phone]  Fax: [phone]     Has the patient been seen for an appointment in the last year OR does the patient have an upcoming appointment? yes  Agent: Please be advised that RX refills may take up to 3 business days. We ask that you follow-up with your pharmacy.

## 2023-05-29 ENCOUNTER — Encounter: Payer: Self-pay | Admitting: *Deleted

## 2023-05-29 NOTE — Telephone Encounter (Signed)
Requested medication (s) are due for refill today:   Provider to review  Requested medication (s) are on the active medication list:   Yes  Future visit scheduled:   No   Sent a MyChart message to call in and schedule her yearly physical.    LOV 06/19/2022.   07/31/2022 - No Show   Last ordered: 12/25/2022 #90, 0 refills  Returned because she needs her physical.      Requested Prescriptions  Pending Prescriptions Disp Refills   doxepin (SINEQUAN) 50 MG capsule 90 capsule 0    Sig: Take 1 capsule (50 mg total) by mouth at bedtime.     Psychiatry:  Antidepressants - Heterocyclics (TCAs) Failed - 05/28/2023  4:54 PM      Failed - Valid encounter within last 6 months    Recent Outpatient Visits           11 months ago Anxiety and depression   [place] [facility] Grayce Sessions, NP   1 year ago Hospital discharge follow-up   [place] [facility] Grayce Sessions, NP             Psychiatry:  Antidepressants - Heterocyclics (TCAs) - doxepin Failed - 05/28/2023  4:54 PM      Failed - Valid encounter within last 12 months    Recent Outpatient Visits           11 months ago Anxiety and depression   [place] [facility] Grayce Sessions, NP   1 year ago Hospital discharge follow-up    [facility] Grayce Sessions, NP

## 2023-05-30 ENCOUNTER — Other Ambulatory Visit (INDEPENDENT_AMBULATORY_CARE_PROVIDER_SITE_OTHER): Payer: Self-pay | Admitting: Primary Care

## 2023-05-30 DIAGNOSIS — F419 Anxiety disorder, unspecified: Secondary | ICD-10-CM

## 2023-05-30 DIAGNOSIS — F32A Depression, unspecified: Secondary | ICD-10-CM

## 2023-06-05 ENCOUNTER — Ambulatory Visit (INDEPENDENT_AMBULATORY_CARE_PROVIDER_SITE_OTHER): Payer: Medicaid Other | Admitting: Primary Care

## 2023-06-06 ENCOUNTER — Ambulatory Visit (INDEPENDENT_AMBULATORY_CARE_PROVIDER_SITE_OTHER): Payer: Self-pay

## 2023-06-06 NOTE — Telephone Encounter (Signed)
Pt had an appt yesterday 06/05/23 and no showed will forward to provider

## 2023-06-06 NOTE — Telephone Encounter (Signed)
Chief Complaint: Earache  Symptoms: bilateral earache, runny nose, cough and sneezing at night, abdominal bloating  Frequency: Comes and goes Pertinent Negatives: Patient denies fever, nausea, vomiting, decrease hearing  Disposition: [] ED /[] Urgent Care (no appt availability in office) / [x] Appointment(In office/virtual)/ []  [facility]/ [] Home Care/ [] Refused Recommended Disposition /[] [facility]/ []  Follow-up with PCP Additional Notes: Patient stated she has had a earache for a couple of days now. She stated that she used a Q-tip and it started to hurt in both ears. Patient reports that she has a runny nose, cough and sneezing but it only occurs at night. Patient also reported abdominal bloating that she was recently seen at urgent care for. Care advice was given and patient has the first appointment available scheduled and has been added to waitlist. Patient does not want to be seen at urgent care at this time but stated if it gets worse she would go to urgent care before her appointment with PCP.  Summary: L & R Ear Pain   Pt is calling to report L &R ear pain and bloating in her stomach. No available appts Please advise     Reason for Disposition  Earache  (Exceptions: brief ear pain of < 60 minutes duration, earache occurring during air travel  Answer Assessment - Initial Assessment Questions 1. LOCATION: "Which ear is involved?"     Bilateral  2. ONSET: "When did the ear start hurting"      A couple of days ago 3. SEVERITY: "How bad is the pain?"  (Scale 1-10; mild, moderate or severe)   - MILD (1-3): doesn't interfere with normal activities    - MODERATE (4-7): interferes with normal activities or awakens from sleep    - SEVERE (8-10): excruciating pain, unable to do any normal activities      9/10 4. URI SYMPTOMS: "Do you have a runny nose or cough?"     Cough, runny nose and sneezing at night  5. FEVER: "Do you have a fever?" If Yes, ask: "What is your  temperature, how was it measured, and when did it start?"     No 6. CAUSE: "Have you been swimming recently?", "How often do you use Q-TIPS?", "Have you had any recent air travel or scuba diving?"     I used Q-tips when they first started hurting  7. OTHER SYMPTOMS: "Do you have any other symptoms?" (e.g., headache, stiff neck, dizziness, vomiting, runny nose, decreased hearing)     Runny nose  Protocols used: Earache-A-AH

## 2023-06-07 ENCOUNTER — Encounter (INDEPENDENT_AMBULATORY_CARE_PROVIDER_SITE_OTHER): Payer: Self-pay | Admitting: Primary Care

## 2023-06-12 ENCOUNTER — Ambulatory Visit (INDEPENDENT_AMBULATORY_CARE_PROVIDER_SITE_OTHER): Payer: Medicaid Other | Admitting: Primary Care

## 2023-06-12 ENCOUNTER — Other Ambulatory Visit (INDEPENDENT_AMBULATORY_CARE_PROVIDER_SITE_OTHER): Payer: Self-pay | Admitting: Primary Care

## 2023-06-12 DIAGNOSIS — F32A Depression, unspecified: Secondary | ICD-10-CM

## 2023-06-12 NOTE — Telephone Encounter (Signed)
Medication Refill -  Most Recent Primary Care Visit:  Provider: Grayce Sessions  Department: [facility]  Visit Type: TELEPHONE OFFICE VISIT  Date: 06/19/2022  Medication:  doxepin (SINEQUAN) 50 MG capsule  Has the patient contacted their pharmacy? Yes It was denied b/c pt needs appt,  pt made appt, but could not get off work to   Is this the correct pharmacy for this prescription? Yes If no, delete pharmacy and type the correct one.  This is the patient's preferred pharmacy:[id number] [place], [place] - [address]    Has the prescription been filled recently? Yes  Is the patient out of the medication? Yes  Has the patient been seen for an appointment in the last year OR does the patient have an upcoming appointment? Yes  Can we respond through MyChart? Yes  Agent: Please be advised that Rx refills may take up to 3 business days. We ask that you follow-up with your pharmacy.

## 2023-06-13 NOTE — Telephone Encounter (Signed)
Requested medication (s) are due for refill today: Yes  Requested medication (s) are on the active medication list: Yes  Last refill:  12/25/22  Future visit scheduled: Yes  Notes to clinic:  See request.    Requested Prescriptions  Pending Prescriptions Disp Refills   doxepin (SINEQUAN) 50 MG capsule 90 capsule 0    Sig: Take 1 capsule (50 mg total) by mouth at bedtime.     Psychiatry:  Antidepressants - Heterocyclics (TCAs) Failed - 06/12/2023  1:16 PM      Failed - Valid encounter within last 6 months    Recent Outpatient Visits           11 months ago Anxiety and depression   [place] [facility] Grayce Sessions, NP   1 year ago Hospital discharge follow-up   [place] [facility] Grayce Sessions, NP       Future Appointments             In 5 days Grayce Sessions, NP [facility]           Psychiatry:  Antidepressants - Heterocyclics (TCAs) - doxepin Failed - 06/12/2023  1:16 PM      Failed - Valid encounter within last 12 months    Recent Outpatient Visits           11 months ago Anxiety and depression   [place] [facility] Grayce Sessions, NP   1 year ago Hospital discharge follow-up   [place] [facility] Grayce Sessions, NP       Future Appointments             In 5 days Randa Evens, Kinnie Scales, NP  [facility]

## 2023-06-18 ENCOUNTER — Other Ambulatory Visit (INDEPENDENT_AMBULATORY_CARE_PROVIDER_SITE_OTHER): Payer: Self-pay | Admitting: Primary Care

## 2023-06-18 ENCOUNTER — Encounter (INDEPENDENT_AMBULATORY_CARE_PROVIDER_SITE_OTHER): Payer: Self-pay | Admitting: Primary Care

## 2023-06-18 ENCOUNTER — Ambulatory Visit (INDEPENDENT_AMBULATORY_CARE_PROVIDER_SITE_OTHER): Payer: Medicaid Other | Admitting: Primary Care

## 2023-06-18 VITALS — BP 120/81 | HR 77 | Resp 16 | Ht 70.0 in | Wt 182.8 lb

## 2023-06-18 DIAGNOSIS — Z2821 Immunization not carried out because of patient refusal: Secondary | ICD-10-CM | POA: Diagnosis not present

## 2023-06-18 DIAGNOSIS — Z28310 Unvaccinated for covid-19: Secondary | ICD-10-CM

## 2023-06-18 DIAGNOSIS — R1033 Periumbilical pain: Secondary | ICD-10-CM

## 2023-06-18 MED ORDER — RIFAXIMIN 550 MG PO TABS: 550.0000 mg | ORAL_TABLET | Freq: Three times a day (TID) | ORAL | 1 refills | Status: DC

## 2023-06-18 NOTE — Patient Instructions (Signed)
Earaches Earaches have a wide range of possible causes. These include:  changes in altitude or air pressure ear infections earwax plugs (impaction) foreign objects lodged in the ear rupture of the eardrum sinus infection sore throat Some earaches require a doctor's care, especially in babies and children.  Mild earaches may resolve on their own, without the need for medical attention.  Using sweet oil for an earache is a folk remedy that may provide symptom relief for mild pain. To use sweet oil for a mild earache, follow these instructions:  Warm sweet oil either on a stove top for 10 to 15 seconds or in a microwave in 8-second intervals. The oil should feel warm, not hot, to the touch. You can use a thermometer to confirm that the oil is no warmer than your body temperature. Lay on your side. Using a sterilized ear dropper, place a few drops into your ear. Cover the ear with a cotton ball, or warm compress, for 5 to 10 minutes. Rub gently. Wipe out any excess ear wax, and the oil, with a cotton ball or a wet cloth. Do not push into the ear canal. Cotton swabs may push earwax further into the ear and should be used only outside of the ear, or not at all. If symptom relief is achieved, repeat for up to three days. If not, contact your doctor and discontinue use.

## 2023-06-18 NOTE — Progress Notes (Signed)
[facility]  Kristina Moyer, is a 42 y.o. female  NWG:[id number]  VHQ:[id number]  DOB - [birth year]  Chief Complaint  Patient presents with   Medication Refill   Otalgia    B/l   Abdominal Pain    With bloating       Subjective:   Kristina Moyer is a 42 y.o. female here today for a follow up visit. Patient has No headache, No chest pain,  - No Nausea, No new weakness tingling or numbness, No Cough - shortness of breath Medication Refill Associated symptoms include abdominal pain.  Otalgia  There is pain in both ears. This is a chronic problem. The current episode started more than 1 year ago. The problem occurs constantly. The problem has been waxing and waning. Associated symptoms include abdominal pain.  Abdominal Pain This is a new problem. The current episode started more than 1 month ago. The onset quality is gradual. The problem occurs daily. The most recent episode lasted 4 days. The problem has been waxing and waning. The pain is located in the epigastric region. The pain is at a severity of 5/10. The pain is moderate. The quality of the pain is a sensation of fullness. The abdominal pain radiates to the periumbilical region. Associated symptoms include flatus. The pain is aggravated by certain positions. The pain is relieved by Recumbency. She has tried nothing for the symptoms. Her past medical history is significant for pancreatitis.   Sample rifaximin  given  No problems updated.  No Known Allergies  Past Medical History:  Diagnosis Date   Alcoholism (HCC)    Anxiety    Pancreatitis, chronic (HCC)     Current Outpatient Medications on File Prior to Visit  Medication Sig Dispense Refill   doxepin (SINEQUAN) 50 MG capsule TAKE 1 CAPSULE BY MOUTH AT BEDTIME. 90 capsule 0   No current facility-administered medications on file prior to visit.    Objective:   Vitals:   06/18/23 1527  BP: 120/81  Pulse: 77  Resp: 16  SpO2: 99%  Weight: 182 lb 12.8  oz (82.9 kg)  Height: 5\' 10"  (1.778 m)    Comprehensive ROS Pertinent positive and negative noted in HPI   Exam General appearance : Awake, alert, not in any distress. Speech Clear. Not toxic looking HEENT: Atraumatic and Normocephalic, pupils equally reactive to light and accomodation Neck: Supple, no JVD. No cervical lymphadenopathy.  Chest: Good air entry bilaterally, no added sounds  CVS: S1 S2 regular, no murmurs.  Abdomen: Bowel sounds present, Non tender and not distended with no gaurding, rigidity or rebound. Extremities: B/L Lower Ext shows no edema, both legs are warm to touch Neurology: Awake alert, and oriented X 3, CN II-XII intact, Non focal Skin: No Rash  Data Review No results found for: "HGBA1C"  Assessment & Plan   Kristina Moyer was seen today for medication refill, otalgia and abdominal pain.  Diagnoses and all orders for this visit:  Influenza vaccination declined  COVID-19 vaccine series declined  Tetanus, diphtheria, and acellular pertussis (Tdap) vaccination declined     Patient have been counseled extensively about nutrition and exercise. Other issues discussed during this visit include: low cholesterol diet, weight control and daily exercise, foot care, annual eye examinations at Ophthalmology, importance of adherence with medications and regular follow-up. We also discussed long term complications of uncontrolled diabetes and hypertension.   Return in about 6 months (around 12/16/2023).  The patient was given clear instructions to go to ER or return  to medical center if symptoms don't improve, worsen or new problems develop. The patient verbalized understanding. The patient was told to call to get lab results if they haven't heard anything in the next week.   This note has been created with Education officer, environmental. Any transcriptional errors are unintentional.   Grayce Sessions, NP 06/18/2023, 3:40 PM

## 2023-06-19 ENCOUNTER — Other Ambulatory Visit (INDEPENDENT_AMBULATORY_CARE_PROVIDER_SITE_OTHER): Payer: Self-pay | Admitting: Primary Care

## 2023-06-19 DIAGNOSIS — R1033 Periumbilical pain: Secondary | ICD-10-CM

## 2023-06-19 NOTE — Telephone Encounter (Signed)
Requested medication (s) are due for refill today: no see below  Requested medication (s) are on the active medication list: yes  Last refill:  06/18/23 #42/1  Future visit scheduled: no  Notes to clinic:  Pharmacy comment: Alternative Requested:NEEDS A PRIOR AUTHORIZATION [id number].      Requested Prescriptions  Pending Prescriptions Disp Refills   XIFAXAN 550 MG TABS tablet [Pharmacy Med Name: XIFAXAN 550 MG TABLET] 42 tablet 1    Sig: TAKE 1 TABLET BY MOUTH 3 TIMES DAILY.     Off-Protocol Failed - 06/19/2023  5:41 PM      Failed - Medication not assigned to a protocol, review manually.      Passed - Valid encounter within last 12 months    Recent Outpatient Visits           Yesterday Influenza vaccination declined   [place] [facility] Grayce Sessions, NP   1 year ago Anxiety and depression   [place] [facility] Grayce Sessions, NP   1 year ago Hospital discharge follow-up    [facility] Grayce Sessions, NP

## 2023-06-19 NOTE — Telephone Encounter (Signed)
Requested medication (s) are due for refill today - no  Requested medication (s) are on the active medication list -yes  Future visit scheduled -no  Last refill: 06/18/23  Notes to clinic: Pharmacy comment: Alternative Requested:PA NEEDED   Requested Prescriptions  Pending Prescriptions Disp Refills   XIFAXAN 550 MG TABS tablet [Pharmacy Med Name: XIFAXAN 550 MG TABLET] 42 tablet 1    Sig: TAKE 1 TABLET BY MOUTH 3 TIMES DAILY.     Off-Protocol Failed - 06/18/2023  4:19 PM      Failed - Medication not assigned to a protocol, review manually.      Passed - Valid encounter within last 12 months    Recent Outpatient Visits           Yesterday Influenza vaccination declined   [place] [facility] Grayce Sessions, NP   1 year ago Anxiety and depression   [place] [facility] Grayce Sessions, NP   1 year ago Hospital discharge follow-up   [place] [facility] Grayce Sessions, NP                 Requested Prescriptions  Pending Prescriptions Disp Refills   XIFAXAN 550 MG TABS tablet [Pharmacy Med Name: XIFAXAN 550 MG TABLET] 42 tablet 1    Sig: TAKE 1 TABLET BY MOUTH 3 TIMES DAILY.     Off-Protocol Failed - 06/18/2023  4:19 PM      Failed - Medication not assigned to a protocol, review manually.      Passed - Valid encounter within last 12 months    Recent Outpatient Visits           Yesterday Influenza vaccination declined   [place] [facility] Grayce Sessions, NP   1 year ago Anxiety and depression   [place] [facility] Grayce Sessions, NP   1 year ago Hospital discharge follow-up   [place] [facility] Grayce Sessions, NP

## 2023-06-24 ENCOUNTER — Other Ambulatory Visit (INDEPENDENT_AMBULATORY_CARE_PROVIDER_SITE_OTHER): Payer: Self-pay | Admitting: Primary Care

## 2023-06-24 DIAGNOSIS — F32A Depression, unspecified: Secondary | ICD-10-CM

## 2023-06-24 NOTE — Telephone Encounter (Signed)
Medication Refill -  Most Recent Primary Care Visit:  Provider: Grayce Sessions  Department: [facility]  Visit Type: OFFICE VISIT  Date: 06/18/2023  Medication: doxepin (SINEQUAN) 50 MG capsule [[id number]]   Has the patient contacted their pharmacy? Yes (Agent: If no, request that the patient contact the pharmacy for the refill. If patient does not wish to contact the pharmacy document the reason why and proceed with request.) (Agent: If yes, when and what did the pharmacy advise?)  Is this the correct pharmacy for this prescription? Yes If no, delete pharmacy and type the correct one.  This is the patient's preferred pharmacy:  [facility] [phone] [place], [place] - [address] [place] [address] Phone: [phone] Fax: [phone]   Has the prescription been filled recently? Yes  Is the patient out of the medication? Yes Pt reports that she has been out of medication for 2-3 weeks  Has the patient been seen for an appointment in the last year OR does the patient have an upcoming appointment? Yes  Can we respond through MyChart? Yes  Agent: Please be advised that Rx refills may take up to 3 business days. We ask that you follow-up with your pharmacy.

## 2023-06-26 NOTE — Telephone Encounter (Signed)
Requested medication (s) are due for refill today: yes   Requested medication (s) are on the active medication list: yes   Last refill:  12/25/22 #90 0 refills   Future visit scheduled: no   Notes to clinic:  no refills remain. Last OV 06/18/23 . Do you want to refill Rx?     Requested Prescriptions  Pending Prescriptions Disp Refills   doxepin (SINEQUAN) 50 MG capsule 90 capsule 0    Sig: Take 1 capsule (50 mg total) by mouth at bedtime.     Psychiatry:  Antidepressants - Heterocyclics (TCAs) Passed - 06/24/2023  9:25 AM      Passed - Valid encounter within last 6 months    Recent Outpatient Visits           1 week ago Influenza vaccination declined   [place] [facility] Grayce Sessions, NP   1 year ago Anxiety and depression   [place] [facility] Grayce Sessions, NP   1 year ago Hospital discharge follow-up   [place] [facility] Grayce Sessions, NP             Psychiatry:  Antidepressants - Heterocyclics (TCAs) - doxepin Passed - 06/24/2023  9:25 AM      Passed - Valid encounter within last 12 months    Recent Outpatient Visits           1 week ago Influenza vaccination declined   [place] [facility] Grayce Sessions, NP   1 year ago Anxiety and depression   [place] [facility] Grayce Sessions, NP   1 year ago Hospital discharge follow-up   [place] [facility] Grayce Sessions, NP

## 2023-06-26 NOTE — Telephone Encounter (Signed)
Working on PA

## 2023-06-26 NOTE — Telephone Encounter (Signed)
Will forward to provider  

## 2023-06-27 ENCOUNTER — Other Ambulatory Visit (INDEPENDENT_AMBULATORY_CARE_PROVIDER_SITE_OTHER): Payer: Self-pay | Admitting: Primary Care

## 2023-06-27 DIAGNOSIS — F32A Depression, unspecified: Secondary | ICD-10-CM

## 2023-06-27 MED ORDER — DOXEPIN HCL 50 MG PO CAPS: 50.0000 mg | ORAL_CAPSULE | Freq: Every day | ORAL | 1 refills | Status: DC

## 2023-07-03 ENCOUNTER — Telehealth (INDEPENDENT_AMBULATORY_CARE_PROVIDER_SITE_OTHER): Payer: Self-pay

## 2023-07-03 NOTE — Telephone Encounter (Signed)
Contacted pt and made aware that RX for Xifaxan was approved by insurance. Pt states she will go pick up today

## 2023-08-06 ENCOUNTER — Telehealth (INDEPENDENT_AMBULATORY_CARE_PROVIDER_SITE_OTHER): Payer: Self-pay | Admitting: Primary Care

## 2023-08-06 NOTE — Telephone Encounter (Signed)
 Will forward to provider

## 2023-08-06 NOTE — Telephone Encounter (Signed)
 Pt called in wanting to know if there is something she can take for tubal ligation reversal.

## 2023-08-07 ENCOUNTER — Encounter (INDEPENDENT_AMBULATORY_CARE_PROVIDER_SITE_OTHER): Payer: Self-pay | Admitting: Primary Care

## 2023-11-14 ENCOUNTER — Other Ambulatory Visit: Payer: Self-pay

## 2023-11-14 ENCOUNTER — Emergency Department (HOSPITAL_COMMUNITY)
Admission: EM | Admit: 2023-11-14 | Discharge: 2023-11-14 | Disposition: A | Discharge status: Home / Self Care | Attending: Emergency Medicine | Admitting: Emergency Medicine

## 2023-11-14 ENCOUNTER — Emergency Department (HOSPITAL_COMMUNITY)

## 2023-11-14 ENCOUNTER — Encounter (HOSPITAL_COMMUNITY): Payer: Self-pay | Admitting: Emergency Medicine

## 2023-11-14 DIAGNOSIS — R197 Diarrhea, unspecified: Secondary | ICD-10-CM | POA: Diagnosis not present

## 2023-11-14 DIAGNOSIS — R1013 Epigastric pain: Secondary | ICD-10-CM | POA: Diagnosis not present

## 2023-11-14 DIAGNOSIS — R1110 Vomiting, unspecified: Secondary | ICD-10-CM | POA: Insufficient documentation

## 2023-11-14 DIAGNOSIS — R079 Chest pain, unspecified: Secondary | ICD-10-CM | POA: Diagnosis not present

## 2023-11-14 DIAGNOSIS — R109 Unspecified abdominal pain: Secondary | ICD-10-CM | POA: Diagnosis present

## 2023-11-14 LAB — COMPREHENSIVE METABOLIC PANEL WITH GFR
ALT: 18 U/L (ref 0–44)
AST: 28 U/L (ref 15–41)
Albumin: 3.8 g/dL (ref 3.5–5.0)
Alkaline Phosphatase: 61 U/L (ref 38–126)
Anion gap: 9 (ref 5–15)
BUN: 11 mg/dL (ref 6–20)
CO2: 21 mmol/L — ABNORMAL LOW (ref 22–32)
Calcium: 8.9 mg/dL (ref 8.9–10.3)
Chloride: 106 mmol/L (ref 98–111)
Creatinine, Ser: 0.68 mg/dL (ref 0.44–1.00)
GFR, Estimated: >60 mL/min (ref >60)
Glucose, Bld: 98 mg/dL (ref 70–99)
Potassium: 3.7 mmol/L (ref 3.5–5.1)
Sodium: 136 mmol/L (ref 135–145)
Total Bilirubin: 0.5 mg/dL (ref 0.0–1.2)
Total Protein: 7 g/dL (ref 6.5–8.1)

## 2023-11-14 LAB — URINALYSIS, ROUTINE W REFLEX MICROSCOPIC
Bilirubin Urine: NEGATIVE
Glucose, UA: NEGATIVE mg/dL
Ketones, ur: NEGATIVE mg/dL
Nitrite: NEGATIVE
Protein, ur: NEGATIVE mg/dL
Specific Gravity, Urine: 1.023 (ref 1.005–1.030)
pH: 6 (ref 5.0–8.0)

## 2023-11-14 LAB — CBC
HCT: 32.2 % — ABNORMAL LOW (ref 36.0–46.0)
Hemoglobin: 10.7 g/dL — ABNORMAL LOW (ref 12.0–15.0)
MCH: 29.9 pg (ref 26.0–34.0)
MCHC: 33.2 g/dL (ref 30.0–36.0)
MCV: 89.9 fL (ref 80.0–100.0)
Platelets: 295 10*3/uL (ref 150–400)
RBC: 3.58 MIL/uL — ABNORMAL LOW (ref 3.87–5.11)
RDW: 14.2 % (ref 11.5–15.5)
WBC: 7.1 10*3/uL (ref 4.0–10.5)
nRBC: 0 % (ref 0.0–0.2)

## 2023-11-14 LAB — TROPONIN I (HIGH SENSITIVITY)
Troponin I (High Sensitivity): <2 ng/L (ref <18)
Troponin I (High Sensitivity): <2 ng/L (ref <18)

## 2023-11-14 LAB — LIPASE, BLOOD: Lipase: 25 U/L (ref 11–51)

## 2023-11-14 LAB — PREGNANCY, URINE: Preg Test, Ur: NEGATIVE

## 2023-11-14 MED ORDER — ONDANSETRON HCL 4 MG/2ML IJ SOLN
4.0000 mg | Freq: Once | INTRAMUSCULAR | Status: AC
  Administered 2023-11-14: 4 mg via INTRAVENOUS
  Filled 2023-11-14: qty 2

## 2023-11-14 MED ORDER — ONDANSETRON 4 MG PO TBDP: ORAL_TABLET | ORAL | 0 refills | Status: DC

## 2023-11-14 MED ORDER — IOHEXOL 300 MG/ML  SOLN
100.0000 mL | Freq: Once | INTRAMUSCULAR | Status: AC | PRN
  Administered 2023-11-14: 100 mL via INTRAVENOUS

## 2023-11-14 MED ORDER — SODIUM CHLORIDE 0.9 % IV BOLUS
1000.0000 mL | Freq: Once | INTRAVENOUS | Status: AC
  Administered 2023-11-14: 1000 mL via INTRAVENOUS

## 2023-11-14 MED ORDER — HYDROMORPHONE HCL 1 MG/ML IJ SOLN
1.0000 mg | Freq: Once | INTRAMUSCULAR | Status: AC
  Administered 2023-11-14: 1 mg via INTRAVENOUS
  Filled 2023-11-14: qty 1

## 2023-11-14 NOTE — ED Triage Notes (Signed)
 Patient presents due to abdominal pain that radiates to the back and chest, shortness of breath and heart burn. Symptoms worsened today so she came in. She has been suffering for a few days and nothing has helped.

## 2023-11-14 NOTE — Discharge Instructions (Addendum)
 As we discussed, your CT scan is unremarkable.  Your pancreas is normal today  You likely had a stomach virus.  Stay hydrated and take Zofran  for nausea  See your doctor for follow-up return to ER if you have severe abdominal pain or vomiting or dehydration or chest pain

## 2023-11-14 NOTE — ED Notes (Signed)
 Pt ambulatory with steady gait to bathroom

## 2023-11-14 NOTE — ED Notes (Signed)
 Patient reports the pain feels worse than when she had issues pancreatitis.

## 2023-11-14 NOTE — ED Provider Notes (Signed)
 [facility] EM[facility] AT [facility] Provider Note   CSN: [id number] Arrival date & time: 11/14/23  1641     History  Chief Complaint  Patient presents with   Abdominal Pain   Chest Pain   Shortness of Breath    Kristina Moyer is a 43 y.o. female history of pancreatitis here presenting with abdominal pain.  Patient states that she had pancreatitis from alcohol previously.  She states that she did drink some alcohol recently and then had worsening abdominal pain and vomiting. Patient states that she is unable to keep anything down today.  She states that she is currently taking rifaximin .  Denies any previous abdominal surgeries other than tubal ligation.  The history is provided by the patient.       Home Medications Prior to Admission medications   Medication Sig Start Date End Date Taking? Authorizing Provider  doxepin  (SINEQUAN ) 50 MG capsule Take 1 capsule (50 mg total) by mouth at bedtime. 06/27/23   Kristina Siemens, NP  rifaximin  (XIFAXAN ) 550 MG TABS tablet Take 1 tablet (550 mg total) by mouth 3 (three) times daily. 06/18/23   Kristina Siemens, NP      Allergies    Patient has no known allergies.    Review of Systems   Review of Systems  Respiratory:  Positive for shortness of breath.   Cardiovascular:  Positive for chest pain.  Gastrointestinal:  Positive for abdominal pain.  All other systems reviewed and are negative.   Physical Exam Updated Vital Signs BP 134/83 (BP Location: Right Arm)   Pulse 80   Temp 98.3 F (36.8 C) (Oral)   Resp 17   Ht 5\' 11"  (1.803 m)   Wt 83 kg   LMP 10/24/2023   SpO2 100%   BMI 25.52 kg/m  Physical Exam Vitals and nursing note reviewed.  Constitutional:      Comments: Uncomfortable and dehydrated  HENT:     Head: Normocephalic.     Mouth/Throat:     Pharynx: Oropharynx is clear.  Eyes:     Extraocular Movements: Extraocular movements intact.     Pupils: Pupils are equal, round, and  reactive to light.  Cardiovascular:     Rate and Rhythm: Normal rate and regular rhythm.     Heart sounds: Normal heart sounds.  Abdominal:     Comments: Mild epigastric tenderness  Skin:    General: Skin is warm.     Capillary Refill: Capillary refill takes less than 2 seconds.  Neurological:     General: No focal deficit present.     Mental Status: She is alert.  Psychiatric:        Mood and Affect: Mood normal.     ED Results / Procedures / Treatments   Labs (all labs ordered are listed, but only abnormal results are displayed) Labs Reviewed  COMPREHENSIVE METABOLIC PANEL WITH GFR - Abnormal; Notable for the following components:      Result Value   CO2 21 (*)    All other components within normal limits  CBC - Abnormal; Notable for the following components:   RBC 3.58 (*)    Hemoglobin 10.7 (*)    HCT 32.2 (*)    All other components within normal limits  URINALYSIS, ROUTINE W REFLEX MICROSCOPIC - Abnormal; Notable for the following components:   APPearance HAZY (*)    Hgb urine dipstick SMALL (*)    Leukocytes,Ua MODERATE (*)    Bacteria, UA FEW (*)  All other components within normal limits  LIPASE, BLOOD  PREGNANCY, URINE  TROPONIN I (HIGH SENSITIVITY)  TROPONIN I (HIGH SENSITIVITY)    EKG None  Radiology DG Chest 2 View Result Date: 11/14/2023 CLINICAL DATA:  Chest pain and shortness of breath. EXAM: CHEST - 2 VIEW COMPARISON:  March 25, 2022 FINDINGS: The heart size and mediastinal contours are within normal limits. Both lungs are clear. The visualized skeletal structures are unremarkable. IMPRESSION: No active cardiopulmonary disease. Electronically Signed   By: Virgle Grime M.D.   On: 11/14/2023 19:30    Procedures Procedures    Medications Ordered in ED Medications  sodium chloride  0.9 % bolus 1,000 mL (1,000 mLs Intravenous New Bag/Given 11/14/23 1826)  HYDROmorphone  (DILAUDID ) injection 1 mg (1 mg Intravenous Given 11/14/23 1816)   ondansetron  (ZOFRAN ) injection 4 mg (4 mg Intravenous Given 11/14/23 1816)    ED Course/ Medical Decision Making/ A&P                                 Medical Decision Making Kristina Moyer is a 43 y.o. female who presented with chest pain and epigastric pain.  Concern for possible pancreatitis versus gastritis.  Plan to get CBC CMP and lipase and CT abdomen pelvis.  Will give pain medicine and nausea medicine and reassess.  10:01 PM Troponin negative x 2.  Lipase level is normal.  UA showed some bacteria but patient has no urinary symptoms.  CT abdomen pelvis is unremarkable.  In particular did not show any pseudocyst or pancreatitis.  Likely gastroenteritis.  Patient is feeling better now.  Stable for discharge.   Problems Addressed: Vomiting and diarrhea: acute illness or injury  Amount and/or Complexity of Data Reviewed Labs: ordered. Decision-making details documented in ED Course. Radiology: ordered and independent interpretation performed. Decision-making details documented in ED Course.  Risk Prescription drug management.   Final Clinical Impression(s) / ED Diagnoses Final diagnoses:  None    Rx / DC Orders ED Discharge Orders     None         Dalene Duck, MD 11/14/23 2203

## 2023-12-12 ENCOUNTER — Other Ambulatory Visit (INDEPENDENT_AMBULATORY_CARE_PROVIDER_SITE_OTHER): Payer: Self-pay

## 2023-12-12 ENCOUNTER — Other Ambulatory Visit (INDEPENDENT_AMBULATORY_CARE_PROVIDER_SITE_OTHER): Payer: Self-pay | Admitting: Primary Care

## 2023-12-12 DIAGNOSIS — F32A Depression, unspecified: Secondary | ICD-10-CM

## 2023-12-12 MED ORDER — DOXEPIN HCL 50 MG PO CAPS: 50.0000 mg | ORAL_CAPSULE | Freq: Every day | ORAL | 1 refills | Status: AC

## 2023-12-22 ENCOUNTER — Emergency Department (HOSPITAL_COMMUNITY)

## 2023-12-22 ENCOUNTER — Emergency Department (HOSPITAL_COMMUNITY)
Admission: EM | Admit: 2023-12-22 | Discharge: 2023-12-22 | Disposition: A | Discharge status: Home / Self Care | Attending: Emergency Medicine | Admitting: Emergency Medicine

## 2023-12-22 DIAGNOSIS — R1010 Upper abdominal pain, unspecified: Secondary | ICD-10-CM | POA: Diagnosis present

## 2023-12-22 DIAGNOSIS — K852 Alcohol induced acute pancreatitis without necrosis or infection: Secondary | ICD-10-CM | POA: Insufficient documentation

## 2023-12-22 LAB — URINALYSIS, ROUTINE W REFLEX MICROSCOPIC
Bilirubin Urine: NEGATIVE
Glucose, UA: NEGATIVE mg/dL
Hgb urine dipstick: NEGATIVE
Ketones, ur: NEGATIVE mg/dL
Nitrite: POSITIVE — AB
Protein, ur: NEGATIVE mg/dL
Specific Gravity, Urine: 1.029 (ref 1.005–1.030)
pH: 5 (ref 5.0–8.0)

## 2023-12-22 LAB — CBC
HCT: 34 % — ABNORMAL LOW (ref 36.0–46.0)
Hemoglobin: 11.1 g/dL — ABNORMAL LOW (ref 12.0–15.0)
MCH: 29.4 pg (ref 26.0–34.0)
MCHC: 32.6 g/dL (ref 30.0–36.0)
MCV: 90.2 fL (ref 80.0–100.0)
Platelets: 288 10*3/uL (ref 150–400)
RBC: 3.77 MIL/uL — ABNORMAL LOW (ref 3.87–5.11)
RDW: 14.3 % (ref 11.5–15.5)
WBC: 5 10*3/uL (ref 4.0–10.5)
nRBC: 0 % (ref 0.0–0.2)

## 2023-12-22 LAB — COMPREHENSIVE METABOLIC PANEL WITH GFR
ALT: 13 U/L (ref 0–44)
AST: 17 U/L (ref 15–41)
Albumin: 3.8 g/dL (ref 3.5–5.0)
Alkaline Phosphatase: 66 U/L (ref 38–126)
Anion gap: 11 (ref 5–15)
BUN: 13 mg/dL (ref 6–20)
CO2: 18 mmol/L — ABNORMAL LOW (ref 22–32)
Calcium: 9 mg/dL (ref 8.9–10.3)
Chloride: 103 mmol/L (ref 98–111)
Creatinine, Ser: 0.6 mg/dL (ref 0.44–1.00)
GFR, Estimated: >60 mL/min (ref >60)
Glucose, Bld: 128 mg/dL — ABNORMAL HIGH (ref 70–99)
Potassium: 3.4 mmol/L — ABNORMAL LOW (ref 3.5–5.1)
Sodium: 132 mmol/L — ABNORMAL LOW (ref 135–145)
Total Bilirubin: 0.7 mg/dL (ref 0.0–1.2)
Total Protein: 7 g/dL (ref 6.5–8.1)

## 2023-12-22 LAB — LIPASE, BLOOD: Lipase: 89 U/L — ABNORMAL HIGH (ref 11–51)

## 2023-12-22 LAB — HCG, SERUM, QUALITATIVE: Preg, Serum: NEGATIVE

## 2023-12-22 MED ORDER — DIPHENHYDRAMINE HCL 50 MG/ML IJ SOLN
12.5000 mg | Freq: Once | INTRAMUSCULAR | Status: AC
  Administered 2023-12-22: 12.5 mg via INTRAVENOUS
  Filled 2023-12-22: qty 1

## 2023-12-22 MED ORDER — MORPHINE SULFATE (PF) 4 MG/ML IV SOLN
4.0000 mg | Freq: Once | INTRAVENOUS | Status: AC
  Administered 2023-12-22: 4 mg via INTRAVENOUS
  Filled 2023-12-22: qty 1

## 2023-12-22 MED ORDER — HYDROCODONE-ACETAMINOPHEN 5-325 MG PO TABS: 1.0000 | ORAL_TABLET | ORAL | 0 refills | Status: DC | PRN

## 2023-12-22 MED ORDER — PROMETHAZINE HCL 25 MG RE SUPP
25.0000 mg | Freq: Four times a day (QID) | RECTAL | 0 refills | Status: DC | PRN
Start: 2023-12-22 — End: 2024-05-18

## 2023-12-22 MED ORDER — HALOPERIDOL LACTATE 5 MG/ML IJ SOLN
2.0000 mg | Freq: Once | INTRAMUSCULAR | Status: AC
  Administered 2023-12-22: 2 mg via INTRAVENOUS
  Filled 2023-12-22: qty 1

## 2023-12-22 MED ORDER — ONDANSETRON 4 MG PO TBDP: 4.0000 mg | ORAL_TABLET | Freq: Three times a day (TID) | ORAL | 0 refills | Status: DC | PRN

## 2023-12-22 MED ORDER — IOHEXOL 300 MG/ML  SOLN
100.0000 mL | Freq: Once | INTRAMUSCULAR | Status: AC | PRN
  Administered 2023-12-22: 100 mL via INTRAVENOUS

## 2023-12-22 MED ORDER — SODIUM CHLORIDE 0.9 % IV BOLUS
500.0000 mL | Freq: Once | INTRAVENOUS | Status: AC
  Administered 2023-12-22: 500 mL via INTRAVENOUS

## 2023-12-22 MED ORDER — ONDANSETRON HCL 4 MG/2ML IJ SOLN
4.0000 mg | Freq: Once | INTRAMUSCULAR | Status: AC
  Administered 2023-12-22: 4 mg via INTRAVENOUS
  Filled 2023-12-22: qty 2

## 2023-12-22 MED ORDER — HYDROMORPHONE HCL 1 MG/ML IJ SOLN
1.0000 mg | Freq: Once | INTRAMUSCULAR | Status: AC
  Administered 2023-12-22: 1 mg via INTRAVENOUS
  Filled 2023-12-22: qty 1

## 2023-12-22 NOTE — ED Triage Notes (Signed)
 Patient here from home reporting epigastric abd pain, denies n/v.

## 2023-12-22 NOTE — ED Provider Notes (Signed)
 [place] EMERGENCY DEPARTMENT AT [facility] Provider Note   CSN: [id number] Arrival date & time: 12/22/23  1914     History  Chief Complaint  Patient presents with   Abdominal Pain    Kristina Moyer is a 43 y.o. female.  43 year old female presents with complaint of upper abdominal pain.  Reports the discomfort to be off and on throughout the day yesterday, worse since early this morning, waking her from her sleep.  Not associated with nausea, vomiting, changes in bowel habits.  Similar to prior pain with pancreatitis.  Drinks wine occasionally.  Prior abdominal surgeries include tubal ligation.  Also urinary frequency without dysuria.  No other complaints or concerns today.       Home Medications Prior to Admission medications   Medication Sig Start Date End Date Taking? Authorizing Provider  HYDROcodone -acetaminophen  (NORCO/VICODIN) 5-325 MG tablet Take 1 tablet by mouth every 4 (four) hours as needed. 12/22/23  Yes Darlis Eisenmenger, PA-C  ondansetron  (ZOFRAN -ODT) 4 MG disintegrating tablet Take 1 tablet (4 mg total) by mouth every 8 (eight) hours as needed for nausea or vomiting. 12/22/23  Yes Darlis Eisenmenger, PA-C  promethazine  (PHENERGAN ) 25 MG suppository Place 1 suppository (25 mg total) rectally every 6 (six) hours as needed for nausea or vomiting. 12/22/23  Yes Darlis Eisenmenger, PA-C  doxepin  (SINEQUAN ) 50 MG capsule Take 1 capsule (50 mg total) by mouth at bedtime. 12/12/23   Marius Siemens, NP  rifaximin  (XIFAXAN ) 550 MG TABS tablet Take 1 tablet (550 mg total) by mouth 3 (three) times daily. 06/18/23   Marius Siemens, NP      Allergies    Patient has no known allergies.    Review of Systems   Review of Systems Negative except as per HPI Physical Exam Updated Vital Signs BP 124/75   Pulse 78   Temp 98 F (36.7 C)   Resp 20   SpO2 98%  Physical Exam Vitals and nursing note reviewed.  Constitutional:      General: She is not in acute  distress.    Appearance: She is well-developed. She is not diaphoretic.  HENT:     Head: Normocephalic and atraumatic.  Cardiovascular:     Rate and Rhythm: Normal rate and regular rhythm.     Heart sounds: Normal heart sounds.  Pulmonary:     Effort: Pulmonary effort is normal.     Breath sounds: Normal breath sounds.  Abdominal:     Palpations: Abdomen is soft.     Tenderness: There is generalized abdominal tenderness. There is guarding. There is no right CVA tenderness or left CVA tenderness.  Skin:    General: Skin is warm and dry.     Findings: No erythema or rash.  Neurological:     Mental Status: She is alert and oriented to person, place, and time.  Psychiatric:        Behavior: Behavior normal.     ED Results / Procedures / Treatments   Labs (all labs ordered are listed, but only abnormal results are displayed) Labs Reviewed  LIPASE, BLOOD - Abnormal; Notable for the following components:      Result Value   Lipase 89 (*)    All other components within normal limits  COMPREHENSIVE METABOLIC PANEL WITH GFR - Abnormal; Notable for the following components:   Sodium 132 (*)    Potassium 3.4 (*)    CO2 18 (*)    Glucose, Bld 128 (*)  All other components within normal limits  CBC - Abnormal; Notable for the following components:   RBC 3.77 (*)    Hemoglobin 11.1 (*)    HCT 34.0 (*)    All other components within normal limits  URINALYSIS, ROUTINE W REFLEX MICROSCOPIC - Abnormal; Notable for the following components:   APPearance HAZY (*)    Nitrite POSITIVE (*)    Leukocytes,Ua SMALL (*)    Bacteria, UA MANY (*)    All other components within normal limits  HCG, SERUM, QUALITATIVE    EKG None  Radiology CT ABDOMEN PELVIS W CONTRAST Result Date: 12/22/2023 CLINICAL DATA:  Epigastric abdominal pain. EXAM: CT ABDOMEN AND PELVIS WITH CONTRAST TECHNIQUE: Multidetector CT imaging of the abdomen and pelvis was performed using the standard protocol following  bolus administration of intravenous contrast. RADIATION DOSE REDUCTION: This exam was performed according to the departmental dose-optimization program which includes automated exposure control, adjustment of the mA and/or kV according to patient size and/or use of iterative reconstruction technique. CONTRAST:  OMNIPAQUE  IOHEXOL  300 MG/ML  SOLN COMPARISON:  11/14/2023.  12/23/2014 FINDINGS: Lower chest: No acute findings. Hepatobiliary: 17 mm lesion in the dome of the left liver is stable since most recent comparison and also comparing back to 12/23/2014 consistent with benign etiology such as hemangioma. Liver otherwise unremarkable. No intrahepatic or extrahepatic biliary dilation. Pancreas: No focal mass lesion. No dilatation of the main duct. No intraparenchymal cyst. Subtle peripancreatic edema/inflammation is seen in the region of the pancreatic tail (axial 15/2). Spleen: No splenomegaly. No suspicious focal mass lesion. Adrenals/Urinary Tract: No adrenal nodule or mass. Tiny well-defined homogeneous low-density lesion in the right kidney is too small to characterize but statistically most likely benign and probably a cyst. No followup imaging is recommended. Left kidney unremarkable. No evidence for hydroureter. The urinary bladder appears normal for the degree of distention. Stomach/Bowel: Stomach is unremarkable. No gastric wall thickening. No evidence of outlet obstruction. Duodenum is normally positioned as is the ligament of Treitz. No small bowel wall thickening. No small bowel dilatation. The terminal ileum is normal. The appendix is not well visualized, but there is no edema or inflammation in the region of the cecal tip to suggest appendicitis. No gross colonic mass. No colonic wall thickening. Vascular/Lymphatic: No abdominal aortic aneurysm. No abdominal aortic atherosclerotic calcification. There is no gastrohepatic or hepatoduodenal ligament lymphadenopathy. No retroperitoneal or mesenteric  lymphadenopathy. No pelvic sidewall lymphadenopathy. Reproductive: 8 mm endometrial stripe thickness can be physiologic in premenopausal female. There is no adnexal mass. Other: No intraperitoneal free fluid. Musculoskeletal: No worrisome lytic or sclerotic osseous abnormality. IMPRESSION: 1. Subtle peripancreatic edema/inflammation in the region of the pancreatic tail. Imaging features compatible with acute pancreatitis. 2. 17 mm lesion in the dome of the left liver is stable since most recent comparison and also comparing back to 12/23/2014 consistent with benign etiology such as hemangioma. No followup imaging is recommended. Electronically Signed   By: Donnal Fusi M.D.   On: 12/22/2023 10:30    Procedures Procedures    Medications Ordered in ED Medications  ondansetron  (ZOFRAN ) injection 4 mg (4 mg Intravenous Given 12/22/23 0910)  morphine  (PF) 4 MG/ML injection 4 mg (4 mg Intravenous Given 12/22/23 0910)  sodium chloride  0.9 % bolus 500 mL (0 mLs Intravenous Stopped 12/22/23 1048)  iohexol  (OMNIPAQUE ) 300 MG/ML solution 100 mL (100 mLs Intravenous Contrast Given 12/22/23 0941)  HYDROmorphone  (DILAUDID ) injection 1 mg (1 mg Intravenous Given 12/22/23 1045)  haloperidol lactate (HALDOL) injection  2 mg (2 mg Intravenous Given 12/22/23 1140)  diphenhydrAMINE  (BENADRYL ) injection 12.5 mg (12.5 mg Intravenous Given 12/22/23 1140)    ED Course/ Medical Decision Making/ A&P                                 Medical Decision Making Amount and/or Complexity of Data Reviewed Labs: ordered. Radiology: ordered.  Risk Prescription drug management.   This patient presents to the ED for concern of abdominal pain, this involves an extensive number of treatment options, and is a complaint that carries with it a high risk of complications and morbidity.  The differential diagnosis includes but not limited to pancreatitis, gastritis, peptic ulcer disease, acute cholecystitis   Co morbidities / Chronic  conditions that complicate the patient evaluation  Review of history on file chronic pancreatitis, alcoholism, anxiety   Additional history obtained:  Additional history obtained from EMR External records from outside source obtained and reviewed including prior labs on file.   Lab Tests:  I Ordered, and personally interpreted labs.  The pertinent results include: Lipase elevated to 89.  hCG negative.  CMP with mild hypokalemia at 3.4.  Urinalysis with nitrites and leukocytes with many bacteria, sample is contaminated.  CBC with normal WBC.   Imaging Studies ordered:  I ordered imaging studies including CT abdomen/pelvis   I independently visualized and interpreted imaging which showed pancreatitis  I agree with the radiologist interpretation   Problem List / ED Course / Critical interventions / Medication management  43 year old female presents with complaint of upper abdominal pain without nausea or vomiting.  On exam, found have generalized abdominal discomfort.  She does have a history of pancreatitis, does admit to drinking wine recently.  Labs returned with elevated lipase although not 3 times normal.  She is afebrile with a normal white count and normal LFTs.  CT with likely pancreatitis.  Urinalysis with complaint of urinary symptoms is positive for nitrites and small leukocytes although contaminated sample, will provide antibiotics due to symptoms with these findings.  Pain is not controlled after morphine  and zofran , ordered Dilaudid , on recheck, patient is vomiting into the wastebasket.  Patient was provided with Haldol and Benadryl .  On recheck, vomiting has resolved and she is tolerating p.o. fluids feeling better and ready for discharge.  She is discharged with Zofran  ODT, Phenergan  suppositories and Norco.  Discussed pancreatic diet.  Follow-up with GI.  Return to ER as needed. I ordered medication including morphine , zofran , dilaudid    , Haldol, Benadryl  Reevaluation of the  patient after these medicines showed that the patient improved, tolerating p.o.'s I have reviewed the patients home medicines and have made adjustments as needed   Social Determinants of Health:  No PCP on file    Test / Admission - Considered:  Tolerating p.o., stable for discharge         Final Clinical Impression(s) / ED Diagnoses Final diagnoses:  Alcohol-induced acute pancreatitis without infection or necrosis    Rx / DC Orders ED Discharge Orders          Ordered    ondansetron  (ZOFRAN -ODT) 4 MG disintegrating tablet  Every 8 hours PRN        12/22/23 1258    promethazine  (PHENERGAN ) 25 MG suppository  Every 6 hours PRN        12/22/23 1258    HYDROcodone -acetaminophen  (NORCO/VICODIN) 5-325 MG tablet  Every 4 hours PRN  12/22/23 1258              Darlis Eisenmenger, PA-C 12/22/23 1259    Wynetta Heckle, MD 12/26/23 [phone]

## 2023-12-22 NOTE — Discharge Instructions (Addendum)
 Follow up with GI, please call to schedule an appointment. Return to ER for fever, pain or vomiting not controlled with medications for pain and vomiting.   Zofran  as needed as prescribed for nausea and vomiting.  Prescription for Phenergan  provided if you are unable to control vomiting with Zofran . Norco as needed as prescribed for pain. Do not drive or operate machinery while taking Nocro. This medication can cause constipation, take laxitive and stool softener as needed.

## 2023-12-23 ENCOUNTER — Ambulatory Visit: Payer: Self-pay | Admitting: *Deleted

## 2023-12-23 NOTE — Telephone Encounter (Signed)
 Chief Complaint: abdominal pain and vomiting  Symptoms: same abdominal pain as of 12/21/23 ED visit. Shooting to chest and back. Vomiting greater than 6 times in past 24 hours. Yellow emesis with small amount of blood noted. After taking hydrocodone  makes continually staying sick. Not tolerating food only water Frequency: past 24 hours  Pertinent Negatives: Patient denies fever no dizziness  Disposition: [x] ED /[] Urgent Care (no appt availability in office) / [] Appointment(In office/virtual)/ []  [place] [facility]/ [] Home Care/ [] Refused Recommended Disposition /[] [facility]/ []  Follow-up with PCP Additional Notes:   Recommended to go back to ED due to vomiting and possible need to change pain medication .  Reviewed with CAL if PCP would be able to assist with medication changes and recommended patient go to ED for controlled substance management.    Copied from CRM [id number]. Topic: Clinical - Red Word Triage >> Dec 23, 2023  8:55 AM Georgeann Kindred wrote: Red Word that prompted transfer to Nurse Triage: Patient was in the ER on 05/31 for inflammation in her pancreas and was given HYDROcodone -acetaminophen  (NORCO/VICODIN) 5-325 MG tablet, promethazine  (PHENERGAN ) 25 MG suppository, and ondansetron  (ZOFRAN -ODT) 4 MG disintegrating tablet. Patient states that she in a severe pain. She states that none of the medication for pain is working. She has already gone through half of the bottle of pain medication trying to get some relief but it has not worked. In addition, she indicates that she is still vomiting from the amount of medication she has taken, she believes. Reason for Disposition  [1] SEVERE vomiting (e.g., 6 or more times/day) AND [2] present > 8 hours (Exception: Patient sounds well, is drinking liquids, does not sound dehydrated, and vomiting has lasted less than 24 hours.)  Answer Assessment - Initial Assessment Questions 1. VOMITING SEVERITY: "How many times have you vomited in  the past 24 hours?"     - MILD:  1 - 2 times/day    - MODERATE: 3 - 5 times/day, decreased oral intake without significant weight loss or symptoms of dehydration    - SEVERE: 6 or more times/day, vomits everything or nearly everything, with significant weight loss, symptoms of dehydration      More than 6 times 2. ONSET: "When did the vomiting begin?"      After taking hydrocodone  3. FLUIDS: "What fluids or food have you vomited up today?" "Have you been able to keep any fluids down?"     Only drinking fluids  4. ABDOMEN PAIN: "Are your having any abdomen pain?" If Yes : "How bad is it and what does it feel like?" (e.g., crampy, dull, intermittent, constant)      Yes pain shoots to chest and back  5. DIARRHEA: "Is there any diarrhea?" If Yes, ask: "How many times today?"      na 6. CONTACTS: "Is there anyone else in the family with the same symptoms?"      na 7. CAUSE: "What do you think is causing your vomiting?"     Not sure  8. HYDRATION STATUS: "Any signs of dehydration?" (e.g., dry mouth [not only dry lips], too weak to stand) "When did you last urinate?"     No  9. OTHER SYMPTOMS: "Do you have any other symptoms?" (e.g., fever, headache, vertigo, vomiting blood or coffee grounds, recent head injury)     Vomiting small amount blood with yellow consistency. X 6 in the past 24 hours after taking hydrocodone   10. PREGNANCY: "Is there any chance you are pregnant?" "  When was your last menstrual period?"       na  Protocols used: Vomiting-A-AH

## 2023-12-23 NOTE — Telephone Encounter (Signed)
Advise patient to go to ED.

## 2024-05-12 ENCOUNTER — Other Ambulatory Visit (INDEPENDENT_AMBULATORY_CARE_PROVIDER_SITE_OTHER): Payer: Self-pay | Admitting: Primary Care

## 2024-05-12 DIAGNOSIS — F419 Anxiety disorder, unspecified: Secondary | ICD-10-CM

## 2024-05-14 NOTE — Telephone Encounter (Signed)
 Requested medications are due for refill today.  yes  Requested medications are on the active medications list.  yes  Last refill. 12/12/2023 #90 1 rf  Future visit scheduled.   no  Notes to clinic.  Pt last seen 06/18/2023 - pt missed 6 month follow up. Pt is more than 3 months overdue for an ov.    Requested Prescriptions  Pending Prescriptions Disp Refills   doxepin  (SINEQUAN ) 50 MG capsule [Pharmacy Med Name: DOXEPIN  50 MG CAPSULE] 90 capsule 1    Sig: TAKE 1 CAPSULE BY MOUTH AT BEDTIME.     Psychiatry:  Antidepressants - Heterocyclics (TCAs) Failed - 05/14/2024  2:13 PM      Failed - Valid encounter within last 6 months    Recent Outpatient Visits           11 months ago Influenza vaccination declined   [place] [facility] Celestia Rosaline SQUIBB, NP   1 year ago Anxiety and depression   [place] [facility] Celestia Rosaline SQUIBB, NP   2 years ago Hospital discharge follow-up   [place] [facility] Celestia Rosaline SQUIBB, NP             Psychiatry:  Antidepressants - Heterocyclics (TCAs) - doxepin  Passed - 05/14/2024  2:13 PM      Passed - Valid encounter within last 12 months    Recent Outpatient Visits           11 months ago Influenza vaccination declined   [place] [facility] Celestia Rosaline SQUIBB, NP   1 year ago Anxiety and depression   [place] [facility] Celestia Rosaline SQUIBB, NP   2 years ago Hospital discharge follow-up   [place] [facility] Celestia Rosaline SQUIBB, NP

## 2024-05-14 NOTE — Telephone Encounter (Signed)
 Will forward to provider

## 2024-05-18 ENCOUNTER — Ambulatory Visit (INDEPENDENT_AMBULATORY_CARE_PROVIDER_SITE_OTHER): Admitting: Primary Care

## 2024-05-18 ENCOUNTER — Encounter (INDEPENDENT_AMBULATORY_CARE_PROVIDER_SITE_OTHER): Payer: Self-pay | Admitting: Primary Care

## 2024-05-18 VITALS — BP 123/79 | HR 82 | Resp 16 | Ht 69.5 in | Wt 178.6 lb

## 2024-05-18 DIAGNOSIS — D649 Anemia, unspecified: Secondary | ICD-10-CM | POA: Diagnosis not present

## 2024-05-18 DIAGNOSIS — Z760 Encounter for issue of repeat prescription: Secondary | ICD-10-CM

## 2024-05-18 DIAGNOSIS — R748 Abnormal levels of other serum enzymes: Secondary | ICD-10-CM | POA: Diagnosis not present

## 2024-05-18 DIAGNOSIS — R5383 Other fatigue: Secondary | ICD-10-CM | POA: Diagnosis not present

## 2024-05-18 DIAGNOSIS — F419 Anxiety disorder, unspecified: Secondary | ICD-10-CM

## 2024-05-18 DIAGNOSIS — R928 Other abnormal and inconclusive findings on diagnostic imaging of breast: Secondary | ICD-10-CM

## 2024-05-18 DIAGNOSIS — F418 Other specified anxiety disorders: Secondary | ICD-10-CM

## 2024-05-18 DIAGNOSIS — T733XXD Exhaustion due to excessive exertion, subsequent encounter: Secondary | ICD-10-CM

## 2024-05-18 MED ORDER — DOXEPIN HCL 50 MG PO CAPS: 50.0000 mg | ORAL_CAPSULE | Freq: Every evening | ORAL | 1 refills | Status: AC | PRN

## 2024-05-18 NOTE — Progress Notes (Signed)
 [facility]  Kristina Moyer, is a 43 y.o. female  RDW:[id number]  FMW:[id number]  DOB - 1980-07-25  Chief Complaint  Patient presents with   Medication Refill    Doxepin        Subjective:   Kristina Moyer is a 43 y.o. female here today for a follow up visit for the management of depression.  She shared her son is in detention and should not be here he was just at the wrong place at the wrong time and his mouth is closed.  This is causing a lot of increased anxiety and depression worried about how her son is how is you doing is he okay.  His birthday is [birth year] the very first birthday that he will not be with his mother and is heartbreaking he will be 11 years old.  Patient denies harm to herself or others any suicidal ideations or auditory or visual hallucinations.  Requesting medication refill . Patient has No headache, No chest pain, No abdominal pain - No Nausea, No new weakness tingling or numbness, No Cough - shortness of breath  No problems updated.  Comprehensive ROS Pertinent positive and negative noted in HPI   No Known Allergies  Past Medical History:  Diagnosis Date   Alcoholism (HCC)    Anxiety    Pancreatitis, chronic (HCC)     Current Outpatient Medications on File Prior to Visit  Medication Sig Dispense Refill   doxepin  (SINEQUAN ) 50 MG capsule Take 1 capsule (50 mg total) by mouth at bedtime. 90 capsule 1   No current facility-administered medications on file prior to visit.   Health Maintenance  Topic Date Due   Hepatitis C Screening  Never done   Hepatitis B Vaccine (1 of 3 - 19+ 3-dose series) Never done   HPV Vaccine (1 - 3-dose SCDM series) Never done   Pap with HPV screening  Never done   Breast Cancer Screening  Never done   COVID-19 Vaccine (1 - 2025-26 season) Never done   DTaP/Tdap/Td vaccine (1 - Tdap) 06/17/2024*   Flu Shot  10/20/2024*   HIV Screening  Completed   Pneumococcal Vaccine  Aged Out   Meningitis B Vaccine   Aged Out  *Topic was postponed. The date shown is not the original due date.    Objective:   Vitals:   05/18/24 1410  BP: 123/79  Pulse: 82  Resp: 16  SpO2: 100%  Weight: 178 lb 9.6 oz (81 kg)  Height: 5' 9.5 (1.765 m)   BP Readings from Last 3 Encounters:  05/18/24 123/79  12/22/23 116/73  11/14/23 121/78      Physical Exam Vitals reviewed.  Constitutional:      Appearance: Normal appearance. She is normal weight.  HENT:     Head: Normocephalic.     Right Ear: Tympanic membrane, ear canal and external ear normal.     Left Ear: Tympanic membrane, ear canal and external ear normal.     Nose: Nose normal.     Mouth/Throat:     Mouth: Mucous membranes are moist.  Eyes:     Extraocular Movements: Extraocular movements intact.     Pupils: Pupils are equal, round, and reactive to light.  Cardiovascular:     Rate and Rhythm: Normal rate and regular rhythm.  Pulmonary:     Effort: Pulmonary effort is normal.     Breath sounds: Normal breath sounds.  Abdominal:     General: Bowel sounds are normal.  Palpations: Abdomen is soft.  Musculoskeletal:        General: Normal range of motion.     Cervical back: Normal range of motion.  Skin:    General: Skin is warm and dry.  Neurological:     Mental Status: She is alert and oriented to person, place, and time.  Psychiatric:        Mood and Affect: Mood normal.        Behavior: Behavior normal.        Thought Content: Thought content normal.     Assessment & Plan   Kristina Moyer was seen today for medication refill.  Diagnoses and all orders for this visit:  Anxiety and depression 2/2 Medication refill -     doxepin  (SINEQUAN ) 50 MG capsule; Take 1 capsule (50 mg total) by mouth at bedtime as needed.  screening mammogram -     MM 3D SCREENING MAMMOGRAM BILATERAL BREAST; Future  Abnormal serum level of lipase -     Lipase -     CMP14+EGFR  Anemia, unspecified type -     CBC with Differential -     Iron, TIBC and  Ferritin Panel  Fatigue due to excessive exertion, subsequent encounter  -     Vitamin B12 -     VITAMIN D 25 Hydroxy (Vit-D Deficiency, Fractures)     Patient have been counseled extensively about nutrition and exercise. Other issues discussed during this visit include: low cholesterol diet, weight control and daily exercise, foot care, annual eye examinations at Ophthalmology, importance of adherence with medications and regular follow-up. We also discussed long term complications of uncontrolled diabetes and hypertension.   Return in about 6 weeks (around 06/29/2024) for pap.  The patient was given clear instructions to go to ER or return to medical center if symptoms don't improve, worsen or new problems develop. The patient verbalized understanding. The patient was told to call to get lab results if they haven't heard anything in the next week.   This note has been created with Education officer, environmental. Any transcriptional errors are unintentional.   Kristina SHAUNNA Bohr, NP 05/18/2024, 2:23 PM

## 2024-05-19 LAB — CMP14+EGFR
ALT: 11 IU/L (ref 0–32)
AST: 17 IU/L (ref 0–40)
Albumin: 4.1 g/dL (ref 3.9–4.9)
Alkaline Phosphatase: 98 IU/L (ref 41–116)
BUN/Creatinine Ratio: 14 (ref 9–23)
BUN: 9 mg/dL (ref 6–24)
Bilirubin Total: 0.2 mg/dL (ref 0.0–1.2)
CO2: 18 mmol/L — ABNORMAL LOW (ref 20–29)
Calcium: 9.4 mg/dL (ref 8.7–10.2)
Chloride: 103 mmol/L (ref 96–106)
Creatinine, Ser: 0.65 mg/dL (ref 0.57–1.00)
Globulin, Total: 2.4 g/dL (ref 1.5–4.5)
Glucose: 102 mg/dL — ABNORMAL HIGH (ref 70–99)
Potassium: 4.3 mmol/L (ref 3.5–5.2)
Sodium: 139 mmol/L (ref 134–144)
Total Protein: 6.5 g/dL (ref 6.0–8.5)
eGFR: 114 mL/min/1.73 (ref >59)

## 2024-05-19 LAB — CBC WITH DIFFERENTIAL/PLATELET
Basophils Absolute: 0 x10E3/uL (ref 0.0–0.2)
Basos: 0 %
EOS (ABSOLUTE): 0.1 x10E3/uL (ref 0.0–0.4)
Eos: 2 %
Hematocrit: 33.1 % — ABNORMAL LOW (ref 34.0–46.6)
Hemoglobin: 10.7 g/dL — ABNORMAL LOW (ref 11.1–15.9)
Immature Grans (Abs): 0 x10E3/uL (ref 0.0–0.1)
Immature Granulocytes: 0 %
Lymphocytes Absolute: 2 x10E3/uL (ref 0.7–3.1)
Lymphs: 35 %
MCH: 29.9 pg (ref 26.6–33.0)
MCHC: 32.3 g/dL (ref 31.5–35.7)
MCV: 93 fL (ref 79–97)
Monocytes Absolute: 0.6 x10E3/uL (ref 0.1–0.9)
Monocytes: 10 %
Neutrophils Absolute: 3 x10E3/uL (ref 1.4–7.0)
Neutrophils: 53 %
Platelets: 321 x10E3/uL (ref 150–450)
RBC: 3.58 x10E6/uL — ABNORMAL LOW (ref 3.77–5.28)
RDW: 12.5 % (ref 11.7–15.4)
WBC: 5.6 x10E3/uL (ref 3.4–10.8)

## 2024-05-19 LAB — IRON,TIBC AND FERRITIN PANEL
Ferritin: 26 ng/mL (ref 15–150)
Iron Saturation: 23 % (ref 15–55)
Iron: 86 ug/dL (ref 27–159)
Total Iron Binding Capacity: 382 ug/dL (ref 250–450)
UIBC: 296 ug/dL (ref 131–425)

## 2024-05-19 LAB — LIPASE: Lipase: 15 U/L (ref 14–72)

## 2024-05-19 LAB — VITAMIN B12: Vitamin B-12: 458 pg/mL (ref 232–1245)

## 2024-05-19 LAB — VITAMIN D 25 HYDROXY (VIT D DEFICIENCY, FRACTURES): Vit D, 25-Hydroxy: 4.8 ng/mL — ABNORMAL LOW (ref 30.0–100.0)

## 2024-05-25 ENCOUNTER — Ambulatory Visit (INDEPENDENT_AMBULATORY_CARE_PROVIDER_SITE_OTHER): Payer: Self-pay | Admitting: Primary Care

## 2024-05-25 ENCOUNTER — Other Ambulatory Visit (INDEPENDENT_AMBULATORY_CARE_PROVIDER_SITE_OTHER): Payer: Self-pay | Admitting: Primary Care

## 2024-05-25 DIAGNOSIS — E559 Vitamin D deficiency, unspecified: Secondary | ICD-10-CM

## 2024-05-25 MED ORDER — ERGOCALCIFEROL 1.25 MG (50000 UT) PO CAPS: 50000.0000 [IU] | ORAL_CAPSULE | ORAL | 0 refills | Status: AC
